# Patient Record
Sex: Male | Born: 1975 | Race: Black or African American | Hispanic: No | Marital: Married | Smoking: Current every day smoker
Health system: Southern US, Community
[De-identification: ages and names within clinical notes are randomized; demographics above are authoritative.]

## PROBLEM LIST (undated history)

## (undated) ENCOUNTER — Ambulatory Visit: Payer: Self-pay | Source: Home / Self Care

## (undated) DIAGNOSIS — R079 Chest pain, unspecified: Secondary | ICD-10-CM

## (undated) DIAGNOSIS — I10 Essential (primary) hypertension: Secondary | ICD-10-CM

## (undated) HISTORY — PX: ADENOIDECTOMY: SUR15

---

## 1998-03-07 ENCOUNTER — Emergency Department (HOSPITAL_COMMUNITY): Admission: EM | Admit: 1998-03-07 | Discharge: 1998-03-07 | Payer: Self-pay | Admitting: *Deleted

## 2000-09-09 ENCOUNTER — Emergency Department (HOSPITAL_COMMUNITY): Admission: EM | Admit: 2000-09-09 | Discharge: 2000-09-09 | Payer: Self-pay

## 2001-04-23 ENCOUNTER — Encounter: Payer: Self-pay | Admitting: Emergency Medicine

## 2001-04-23 ENCOUNTER — Emergency Department (HOSPITAL_COMMUNITY): Admission: EM | Admit: 2001-04-23 | Discharge: 2001-04-23 | Payer: Self-pay | Admitting: Emergency Medicine

## 2002-03-21 ENCOUNTER — Emergency Department (HOSPITAL_COMMUNITY): Admission: EM | Admit: 2002-03-21 | Discharge: 2002-03-21 | Payer: Self-pay | Admitting: Emergency Medicine

## 2002-05-21 ENCOUNTER — Emergency Department (HOSPITAL_COMMUNITY): Admission: EM | Admit: 2002-05-21 | Discharge: 2002-05-21 | Payer: Self-pay | Admitting: Emergency Medicine

## 2003-05-10 ENCOUNTER — Emergency Department (HOSPITAL_COMMUNITY): Admission: EM | Admit: 2003-05-10 | Discharge: 2003-05-10 | Payer: Self-pay | Admitting: Emergency Medicine

## 2005-01-19 ENCOUNTER — Emergency Department (HOSPITAL_COMMUNITY): Admission: EM | Admit: 2005-01-19 | Discharge: 2005-01-20 | Payer: Self-pay | Admitting: Emergency Medicine

## 2005-05-21 ENCOUNTER — Emergency Department (HOSPITAL_COMMUNITY): Admission: EM | Admit: 2005-05-21 | Discharge: 2005-05-21 | Payer: Self-pay | Admitting: Emergency Medicine

## 2006-12-05 ENCOUNTER — Emergency Department (HOSPITAL_COMMUNITY): Admission: EM | Admit: 2006-12-05 | Discharge: 2006-12-06 | Payer: Self-pay | Admitting: Emergency Medicine

## 2007-05-05 ENCOUNTER — Emergency Department (HOSPITAL_COMMUNITY): Admission: EM | Admit: 2007-05-05 | Discharge: 2007-05-05 | Payer: Self-pay | Admitting: Emergency Medicine

## 2007-09-25 ENCOUNTER — Emergency Department (HOSPITAL_COMMUNITY): Admission: EM | Admit: 2007-09-25 | Discharge: 2007-09-25 | Payer: Self-pay | Admitting: Emergency Medicine

## 2007-11-20 ENCOUNTER — Emergency Department (HOSPITAL_COMMUNITY): Admission: EM | Admit: 2007-11-20 | Discharge: 2007-11-20 | Payer: Self-pay | Admitting: Emergency Medicine

## 2015-07-25 ENCOUNTER — Encounter (HOSPITAL_COMMUNITY): Payer: Self-pay

## 2015-07-25 ENCOUNTER — Emergency Department (HOSPITAL_COMMUNITY)
Admission: EM | Admit: 2015-07-25 | Discharge: 2015-07-25 | Disposition: A | Discharge status: Home / Self Care | Payer: Self-pay | Attending: Emergency Medicine | Admitting: Emergency Medicine

## 2015-07-25 DIAGNOSIS — Z720 Tobacco use: Secondary | ICD-10-CM | POA: Insufficient documentation

## 2015-07-25 DIAGNOSIS — J069 Acute upper respiratory infection, unspecified: Secondary | ICD-10-CM | POA: Insufficient documentation

## 2015-07-25 DIAGNOSIS — R52 Pain, unspecified: Secondary | ICD-10-CM | POA: Insufficient documentation

## 2015-07-25 DIAGNOSIS — B349 Viral infection, unspecified: Secondary | ICD-10-CM | POA: Insufficient documentation

## 2015-07-25 LAB — COMPREHENSIVE METABOLIC PANEL
ALBUMIN: 4 g/dL (ref 3.5–5.0)
ALK PHOS: 88 U/L (ref 38–126)
ALT: 23 U/L (ref 17–63)
ANION GAP: 8 (ref 5–15)
AST: 29 U/L (ref 15–41)
BUN: 12 mg/dL (ref 6–20)
CALCIUM: 9.1 mg/dL (ref 8.9–10.3)
CO2: 27 mmol/L (ref 22–32)
Chloride: 104 mmol/L (ref 101–111)
Creatinine, Ser: 1.11 mg/dL (ref 0.61–1.24)
GFR calc Af Amer: >60 mL/min (ref >60)
GFR calc non Af Amer: >60 mL/min (ref >60)
GLUCOSE: 89 mg/dL (ref 65–99)
Potassium: 3.1 mmol/L — ABNORMAL LOW (ref 3.5–5.1)
SODIUM: 139 mmol/L (ref 135–145)
Total Bilirubin: 0.6 mg/dL (ref 0.3–1.2)
Total Protein: 7.9 g/dL (ref 6.5–8.1)

## 2015-07-25 LAB — CBC
HEMATOCRIT: 40.6 % (ref 39.0–52.0)
HEMOGLOBIN: 13.4 g/dL (ref 13.0–17.0)
MCH: 29 pg (ref 26.0–34.0)
MCHC: 33 g/dL (ref 30.0–36.0)
MCV: 87.9 fL (ref 78.0–100.0)
Platelets: 258 10*3/uL (ref 150–400)
RBC: 4.62 MIL/uL (ref 4.22–5.81)
RDW: 14.3 % (ref 11.5–15.5)
WBC: 7.6 10*3/uL (ref 4.0–10.5)

## 2015-07-25 LAB — LIPASE, BLOOD: Lipase: 20 U/L — ABNORMAL LOW (ref 22–51)

## 2015-07-25 MED ORDER — ALBUTEROL SULFATE HFA 108 (90 BASE) MCG/ACT IN AERS
2.0000 | INHALATION_SPRAY | Freq: Once | RESPIRATORY_TRACT | Status: AC
  Administered 2015-07-25: 2 via RESPIRATORY_TRACT
  Filled 2015-07-25: qty 6.7

## 2015-07-25 NOTE — ED Provider Notes (Signed)
CSN: [id number]     Arrival date & time 07/25/15  1530 History   First MD Initiated Contact with Patient 07/25/15 1614     Chief Complaint  Patient presents with  . Generalized Body Aches  . Emesis  . Diarrhea     (Consider location/radiation/quality/duration/timing/severity/associated sxs/prior Treatment) HPI Jeremy Hood is a 39 y.o. male who comes in for evaluation of generalized body aches, nausea and vomiting for the past 3 days. Patient states he was outside in the rain, took a bus home and was "very cold and had a seizure lasting", and at that time he began to have a runny nose, cough and nasal congestion. He reports associated generalized body aches and a subjective fever yesterday of 101.8. He also reports 1 episode of posttussive emesis and loose stools for 2 days, nonbloody. He denies taking any medications and his fever spontaneously resolved. Patient denies any overt discomfort now, reports that he needs a work note. No headache, chest pain, shortness of breath, leg swelling. No other aggravating or modifying factors.  History reviewed. No pertinent past medical history. Past Surgical History  Procedure Laterality Date  . Adnoidectomy     History reviewed. No pertinent family history. Social History  Substance Use Topics  . Smoking status: Current Every Day Smoker -- 0.00 packs/day    Types: Cigarettes  . Smokeless tobacco: None  . Alcohol Use: No    Review of Systems A 10 point review of systems was completed and was negative except for pertinent positives and negatives as mentioned in the history of present illness     Allergies  Review of patient's allergies indicates no known allergies.  Home Medications   Prior to Admission medications   Not on File   BP 181/99 mmHg  Pulse 77  Temp(Src) 99.1 F (37.3 C) (Oral)  Resp 16  SpO2 98% Physical Exam  Constitutional: He is oriented to person, place, and time. He appears well-developed and  well-nourished.  HENT:  Head: Normocephalic and atraumatic.  Mouth/Throat: Oropharynx is clear and moist.  Eyes: Conjunctivae are normal. Pupils are equal, round, and reactive to light. Right eye exhibits no discharge. Left eye exhibits no discharge. No scleral icterus.  Neck: Neck supple.  Cardiovascular: Normal rate, regular rhythm and normal heart sounds.   Pulmonary/Chest: Effort normal and breath sounds normal. No respiratory distress. He has no rales.  Para mild wheezing in right lower lobe. Lungs otherwise clear with no other adventitious lung sounds. Oxygenation is 99% on room air.  Abdominal: Soft. There is no tenderness.  Musculoskeletal: He exhibits no tenderness.  Neurological: He is alert and oriented to person, place, and time.  Cranial Nerves II-XII grossly intact  Skin: Skin is warm and dry. No rash noted.  Psychiatric: He has a normal mood and affect.  Nursing note and vitals reviewed.   ED Course  Procedures (including critical care time) Labs Review Labs Reviewed  LIPASE, BLOOD - Abnormal; Notable for the following:    Lipase 20 (*)    All other components within normal limits  COMPREHENSIVE METABOLIC PANEL - Abnormal; Notable for the following:    Potassium 3.1 (*)    All other components within normal limits  CBC  URINALYSIS, ROUTINE W REFLEX MICROSCOPIC (NOT AT [facility])    Imaging Review No results found. I have personally reviewed and evaluated these images and lab results as part of my medical decision-making.   EKG Interpretation None     Meds given in ED:  Medications  albuterol (PROVENTIL HFA;VENTOLIN HFA) 108 (90 BASE) MCG/ACT inhaler 2 puff (2 puffs Inhalation Given 07/25/15 1735)    There are no discharge medications for this patient.  Filed Vitals:   07/25/15 1558 07/25/15 1732 07/25/15 1735 07/25/15 1740  BP:   186/106 181/99  Pulse:  77    Temp:  99.1 F (37.3 C)    TempSrc:  Oral    Resp: 20 16    SpO2:  98%      MDM  Vitals  stable - afebrile. Patient will need follow-up with his primary care provider for further evaluation of his elevated blood pressure. No evidence of hypertensive emergency at this time. Pt resting comfortably in ED. PE--physical examination as above and grossly unremarkable. Very mild wheezing in right lower lobe Labs appear baseline for patient and are grossly unremarkable.  DDX--patient with URI symptoms secondary to likely viral syndrome. Patient declines any prescription medications at this time. Reports he only needs a work note. Will DC with MDI or mild wheezing and symptom support. No evidence of other acute or emergent pathology at this time. Overall, patient appears well, nontoxic and is appropriate for discharge. I discussed all relevant lab findings and imaging results with pt and they verbalized understanding. Discussed f/u with PCP within 48 hrs and return precautions, pt very amenable to plan.  Final diagnoses:  URI (upper respiratory infection)  Viral syndrome  Body aches      Comer Locket, PA-C 07/25/15 1900  Merrily Pew, MD 07/26/15 1715

## 2015-07-25 NOTE — Discharge Instructions (Signed)
Viral Infections °A viral infection can be caused by different types of viruses. Most viral infections are not serious and resolve on their own. However, some infections may cause severe symptoms and may lead to further complications. °SYMPTOMS °Viruses can frequently cause: °· Minor sore throat. °· Aches and pains. °· Headaches. °· Runny nose. °· Different types of rashes. °· Watery eyes. °· Tiredness. °· Cough. °· Loss of appetite. °· Gastrointestinal infections, resulting in nausea, vomiting, and diarrhea. °These symptoms do not respond to antibiotics because the infection is not caused by bacteria. However, you might catch a bacterial infection following the viral infection. This is sometimes called a "superinfection." Symptoms of such a bacterial infection may include: °· Worsening sore throat with pus and difficulty swallowing. °· Swollen neck glands. °· Chills and a high or persistent fever. °· Severe headache. °· Tenderness over the sinuses. °· Persistent overall ill feeling (malaise), muscle aches, and tiredness (fatigue). °· Persistent cough. °· Yellow, green, or brown mucus production with coughing. °HOME CARE INSTRUCTIONS  °· Only take over-the-counter or prescription medicines for pain, discomfort, diarrhea, or fever as directed by your caregiver. °· Drink enough water and fluids to keep your urine clear or pale yellow. Sports drinks can provide valuable electrolytes, sugars, and hydration. °· Get plenty of rest and maintain proper nutrition. Soups and broths with crackers or rice are fine. °SEEK IMMEDIATE MEDICAL CARE IF:  °· You have severe headaches, shortness of breath, chest pain, neck pain, or an unusual rash. °· You have uncontrolled vomiting, diarrhea, or you are unable to keep down fluids. °· You or your child has an oral temperature above 102° F (38.9° C), not controlled by medicine. °· Your baby is older than 3 months with a rectal temperature of 102° F (38.9° C) or higher. °· Your baby is 3  months old or younger with a rectal temperature of 100.4° F (38° C) or higher. °MAKE SURE YOU:  °· Understand these instructions. °· Will watch your condition. °· Will get help right away if you are not doing well or get worse. °  °This information is not intended to replace advice given to you by your health care provider. Make sure you discuss any questions you have with your health care provider. °  °Document Released: 07/16/2005 Document Revised: 12/29/2011 Document Reviewed: 03/14/2015 °Elsevier Interactive Patient Education ©2016 [employer] Inc[employer] ° °

## 2015-07-25 NOTE — ED Notes (Signed)
Patient C/O body aches, sore throat and congestion since Sunday Oct 2.  C/O of N/V/D since Monday, last vomited this morning.  Patient reports fevers at night with a fever of 101.8 last night.  Patient denies taking anything for symptoms today.  Patient denies pain.  NAD at this time.

## 2015-07-25 NOTE — ED Notes (Signed)
Pt escorted to discharge window. Pt verbalized understanding discharge instructions. In no acute distress.  

## 2015-12-16 ENCOUNTER — Encounter (HOSPITAL_COMMUNITY): Payer: Self-pay

## 2015-12-16 ENCOUNTER — Emergency Department (HOSPITAL_COMMUNITY)
Admission: EM | Admit: 2015-12-16 | Discharge: 2015-12-16 | Disposition: A | Discharge status: Home / Self Care | Payer: Self-pay | Attending: Emergency Medicine | Admitting: Emergency Medicine

## 2015-12-16 DIAGNOSIS — R197 Diarrhea, unspecified: Secondary | ICD-10-CM | POA: Insufficient documentation

## 2015-12-16 DIAGNOSIS — R6889 Other general symptoms and signs: Secondary | ICD-10-CM

## 2015-12-16 DIAGNOSIS — R509 Fever, unspecified: Secondary | ICD-10-CM | POA: Insufficient documentation

## 2015-12-16 DIAGNOSIS — R001 Bradycardia, unspecified: Secondary | ICD-10-CM | POA: Insufficient documentation

## 2015-12-16 DIAGNOSIS — R05 Cough: Secondary | ICD-10-CM | POA: Insufficient documentation

## 2015-12-16 DIAGNOSIS — F17210 Nicotine dependence, cigarettes, uncomplicated: Secondary | ICD-10-CM | POA: Insufficient documentation

## 2015-12-16 DIAGNOSIS — R112 Nausea with vomiting, unspecified: Secondary | ICD-10-CM | POA: Insufficient documentation

## 2015-12-16 DIAGNOSIS — R0981 Nasal congestion: Secondary | ICD-10-CM | POA: Insufficient documentation

## 2015-12-16 DIAGNOSIS — J029 Acute pharyngitis, unspecified: Secondary | ICD-10-CM | POA: Insufficient documentation

## 2015-12-16 MED ORDER — ONDANSETRON HCL 4 MG PO TABS: 4.0000 mg | ORAL_TABLET | Freq: Four times a day (QID) | ORAL | Status: DC

## 2015-12-16 MED ORDER — PSEUDOEPHEDRINE HCL 30 MG PO TABS: 30.0000 mg | ORAL_TABLET | ORAL | Status: DC | PRN

## 2015-12-16 NOTE — ED Provider Notes (Signed)
CSN: [id number]     Arrival date & time 12/16/15  1222 History   First MD Initiated Contact with Patient 12/16/15 1449     Chief Complaint  Patient presents with  . Fever  . Diarrhea     (Consider location/radiation/quality/duration/timing/severity/associated sxs/prior Treatment) HPI  Flu like symptoms for 4 days. Diarrhea, vomiting, cough, congestion, sore throat fever with tmax of 102. No known sick contacts. Slightly improved now. Has tried emergenC without relief. Didn't try anything else.   History reviewed. No pertinent past medical history. Past Surgical History  Procedure Laterality Date  . Adenoidectomy     History reviewed. No pertinent family history. Social History  Substance Use Topics  . Smoking status: Current Every Day Smoker -- 0.00 packs/day    Types: Cigarettes  . Smokeless tobacco: None  . Alcohol Use: No    Review of Systems  Constitutional: Negative for fever and fatigue.  Eyes: Negative for pain.  Respiratory: Positive for cough. Negative for shortness of breath.   Gastrointestinal: Positive for nausea, vomiting and diarrhea. Negative for abdominal pain.  Endocrine: Negative for polydipsia and polyuria.  Genitourinary: Negative for dysuria.  Musculoskeletal: Negative for back pain and neck pain.  All other systems reviewed and are negative.     Allergies  Review of patient's allergies indicates no known allergies.  Home Medications   Prior to Admission medications   Medication Sig Start Date End Date Taking? Authorizing Provider  ondansetron (ZOFRAN) 4 MG tablet Take 1 tablet (4 mg total) by mouth every 6 (six) hours. 12/16/15   Merrily Pew, MD  pseudoephedrine (SUDAFED) 30 MG tablet Take 1 tablet (30 mg total) by mouth every 4 (four) hours as needed for congestion. 12/16/15   Corene Cornea Reygan Heagle, MD   BP 163/80 mmHg  Pulse 40  Temp(Src) 98.4 F (36.9 C) (Oral)  Resp 18  SpO2 100% Physical Exam  Constitutional: He is oriented to person, place,  and time. He appears well-developed and well-nourished.  HENT:  Head: Normocephalic and atraumatic.  Neck: Normal range of motion.  Cardiovascular: Normal rate and regular rhythm.   Heart rate on my evaluation was in the 60s.   Pulmonary/Chest: Effort normal. No respiratory distress.  Abdominal: Soft. He exhibits no distension. There is no tenderness.  Musculoskeletal: Normal range of motion. He exhibits no edema or tenderness.  Neurological: He is alert and oriented to person, place, and time.  Skin: Skin is warm and dry.  Nursing note and vitals reviewed.   ED Course  Procedures (including critical care time) Labs Review Labs Reviewed - No data to display  Imaging Review No results found. I have personally reviewed and evaluated these images and lab results as part of my medical decision-making.   EKG Interpretation None      MDM   Final diagnoses:  Flu-like symptoms   Flu like symptoms. Appears well and non-toxic. Will give rx for symptomatic treatment. Note work as well.      Merrily Pew, MD 12/16/15 1520

## 2015-12-16 NOTE — ED Notes (Signed)
Per pt, fever, cough, congestion, abdominal pain, diarrhea since Tuesday.  Pt states he has been drinking gatorade.

## 2015-12-25 ENCOUNTER — Emergency Department (HOSPITAL_COMMUNITY)
Admission: EM | Admit: 2015-12-25 | Discharge: 2015-12-25 | Disposition: A | Discharge status: Home / Self Care | Payer: Self-pay | Attending: Emergency Medicine | Admitting: Emergency Medicine

## 2015-12-25 ENCOUNTER — Encounter (HOSPITAL_COMMUNITY): Payer: Self-pay | Admitting: Emergency Medicine

## 2015-12-25 DIAGNOSIS — L0231 Cutaneous abscess of buttock: Secondary | ICD-10-CM | POA: Insufficient documentation

## 2015-12-25 DIAGNOSIS — R509 Fever, unspecified: Secondary | ICD-10-CM | POA: Insufficient documentation

## 2015-12-25 DIAGNOSIS — L0291 Cutaneous abscess, unspecified: Secondary | ICD-10-CM

## 2015-12-25 DIAGNOSIS — F17210 Nicotine dependence, cigarettes, uncomplicated: Secondary | ICD-10-CM | POA: Insufficient documentation

## 2015-12-25 MED ORDER — SULFAMETHOXAZOLE-TRIMETHOPRIM 800-160 MG PO TABS: 1.0000 | ORAL_TABLET | Freq: Two times a day (BID) | ORAL | Status: AC

## 2015-12-25 MED ORDER — OXYCODONE-ACETAMINOPHEN 5-325 MG PO TABS
1.0000 | ORAL_TABLET | Freq: Once | ORAL | Status: AC
  Administered 2015-12-25: 1 via ORAL
  Filled 2015-12-25: qty 1

## 2015-12-25 MED ORDER — LIDOCAINE HCL (PF) 1 % IJ SOLN: 30.0000 mL | Freq: Once | INTRAMUSCULAR | Status: DC

## 2015-12-25 MED ORDER — LIDOCAINE HCL 1 % IJ SOLN
INTRAMUSCULAR | Status: AC
  Administered 2015-12-25: 20 mL
  Filled 2015-12-25: qty 20

## 2015-12-25 NOTE — ED Notes (Addendum)
Pt reports abscess to upper buttocks at the top of "my crack". Pain is 10/10. No drainage.

## 2015-12-25 NOTE — ED Provider Notes (Signed)
CSN: [id number]     Arrival date & time 12/25/15  1219 History   By signing my name below, I, Forrestine Him, attest that this documentation has been prepared under the direction and in the presence of Khaden Gater PA-C.  Electronically Signed: Forrestine Him, ED Scribe. 12/25/2015. 4:31 PM.   Chief Complaint  Patient presents with  . Abscess   The history is provided by the patient. No language interpreter was used.    HPI Comments: Jeremy Hood is a 40 y.o. male with a PMHx of abscesses who presents to the Emergency Department here for a possible abscess to the buttocks x 1 week. Currently he rates pain 10/10. No drainage noted. Discomfort is exacerbated with pressure to the area. No alleviating factors at this time. Pt also reports a fever of 100.2 at home along with some mild nausea. No OTC medications or home remedies attempted prior to arrival. No recent vomiting or chills. Pt admits abscesses in the past have required I&D procedures for treatment and healing. No known allergies to medications.  PCP: No primary care provider on file.    History reviewed. No pertinent past medical history. Past Surgical History  Procedure Laterality Date  . Adenoidectomy     No family history on file. Social History  Substance Use Topics  . Smoking status: Current Every Day Smoker -- 0.00 packs/day    Types: Cigarettes  . Smokeless tobacco: None  . Alcohol Use: No    Review of Systems  Constitutional: Positive for fever.  Skin: Positive for wound.  All other systems reviewed and are negative.     Allergies  Review of patient's allergies indicates no known allergies.  Home Medications   Prior to Admission medications   Medication Sig Start Date End Date Taking? Authorizing Provider  ondansetron (ZOFRAN) 4 MG tablet Take 1 tablet (4 mg total) by mouth every 6 (six) hours. 12/16/15   Merrily Pew, MD  pseudoephedrine (SUDAFED) 30 MG tablet Take 1 tablet (30 mg total) by mouth every 4  (four) hours as needed for congestion. 12/16/15   Merrily Pew, MD  sulfamethoxazole-trimethoprim (BACTRIM DS,SEPTRA DS) 800-160 MG tablet Take 1 tablet by mouth 2 (two) times daily. 12/25/15 01/01/16  Maryella Abood, PA-C   Triage Vitals: BP 157/100 mmHg  Pulse 83  Temp(Src) 98.3 F (36.8 C) (Oral)  Resp 18  SpO2 100%   Physical Exam  Constitutional: He appears well-developed and well-nourished. No distress.  Nontoxic appearing. Afebrile.   HENT:  Head: Normocephalic and atraumatic.  Right Ear: External ear normal.  Left Ear: External ear normal.  Eyes: Conjunctivae are normal. Right eye exhibits no discharge. Left eye exhibits no discharge. No scleral icterus.  Neck: Normal range of motion.  Cardiovascular: Normal rate.   Pulmonary/Chest: Effort normal.  Musculoskeletal: Normal range of motion.  Moves all extremities spontaneously  Neurological: He is alert. Coordination normal.  Skin: Skin is warm and dry.  Large area of induration with central fluctuance at superior gluteal cleft. Fluctuance approximately 3 cm in diameter. No active drainage at this time. No overlying erythema. No warmth of the area.   Psychiatric: He has a normal mood and affect. His behavior is normal.  Nursing note and vitals reviewed.   ED Course  Procedures (including critical care time)  DIAGNOSTIC STUDIES: Oxygen Saturation is 100% on RA, Normal by my interpretation.    COORDINATION OF CARE: 3:12 PM- Will perform I&D procedure. Discussed treatment plan with pt at bedside and pt agreed  to plan.     INCISION AND DRAINAGE PROCEDURE NOTE: Patient identification was confirmed and verbal consent was obtained. This procedure was performed by Josephina Gip PA-C at 4:02 PM. Site: Gluteal cleft  Sterile procedures observed Needle size: 23 Anesthetic used (type and amt): 1% lidocaine without epinephrine, 6 mL Blade size: 11 Drainage: Copious  Complexity: Complex Packing used: Yes, 1/4 iodoform packing   Site anesthetized, incision made over site, wound drained and explored loculations, rinsed with copious amounts of normal saline, wound packed with sterile gauze, covered with dry, sterile dressing.  Pt tolerated procedure well without complications.  Instructions for care discussed verbally and pt provided with additional written instructions for homecare and f/u.   Labs Review Labs Reviewed - No data to display  Imaging Review No results found. I have personally reviewed and evaluated these images and lab results as part of my medical decision-making.   EKG Interpretation None      MDM   Final diagnoses:  Abscess   Patient presenting with skin abscess of the gluteal cleft amenable to incision and drainage.  Patient tolerated the procedure well. 1/4 iodoform packing inserted. Due to hx of frequent abscess and size, will send home on bactrim. Encouraged warm soaks at home and keeping the wound clean and dry. Instructed to go to PCP or urgent care in 2 days for wound recheck. Patient expresses understanding and is stable for discharge.   I personally performed the services described in this documentation, which was scribed in my presence. The recorded information has been reviewed and is accurate.   Lahoma Crocker Yolonda Purtle, PA-C 12/25/15 1631  Tanna Furry, MD 12/31/15 [id number]

## 2015-12-25 NOTE — Discharge Instructions (Signed)
Go to PCP, urgent care or return to ED in 2 days for a wound check and to have your packing removed.    Abscess An abscess is an infected area that contains a collection of pus and debris.It can occur in almost any part of the body. An abscess is also known as a furuncle or boil. CAUSES  An abscess occurs when tissue gets infected. This can occur from blockage of oil or sweat glands, infection of hair follicles, or a minor injury to the skin. As the body tries to fight the infection, pus collects in the area and creates pressure under the skin. This pressure causes pain. People with weakened immune systems have difficulty fighting infections and get certain abscesses more often.  SYMPTOMS Usually an abscess develops on the skin and becomes a painful mass that is red, warm, and tender. If the abscess forms under the skin, you may feel a moveable soft area under the skin. Some abscesses break open (rupture) on their own, but most will continue to get worse without care. The infection can spread deeper into the body and eventually into the bloodstream, causing you to feel ill.  DIAGNOSIS  Your caregiver will take your medical history and perform a physical exam. A sample of fluid may also be taken from the abscess to determine what is causing your infection. TREATMENT  Your caregiver may prescribe antibiotic medicines to fight the infection. However, taking antibiotics alone usually does not cure an abscess. Your caregiver may need to make a small cut (incision) in the abscess to drain the pus. In some cases, gauze is packed into the abscess to reduce pain and to continue draining the area. HOME CARE INSTRUCTIONS   Only take over-the-counter or prescription medicines for pain, discomfort, or fever as directed by your caregiver.  If you were prescribed antibiotics, take them as directed. Finish them even if you start to feel better.  If gauze is used, follow your caregiver's directions for changing  the gauze.  To avoid spreading the infection:  Keep your draining abscess covered with a bandage.  Wash your hands well.  Do not share personal care items, towels, or whirlpools with others.  Avoid skin contact with others.  Keep your skin and clothes clean around the abscess.  Keep all follow-up appointments as directed by your caregiver. SEEK MEDICAL CARE IF:   You have increased pain, swelling, redness, fluid drainage, or bleeding.  You have muscle aches, chills, or a general ill feeling.  You have a fever. MAKE SURE YOU:   Understand these instructions.  Will watch your condition.  Will get help right away if you are not doing well or get worse.   This information is not intended to replace advice given to you by your health care provider. Make sure you discuss any questions you have with your health care provider.   Document Released: 07/16/2005 Document Revised: 04/06/2012 Document Reviewed: 12/19/2011 Elsevier Interactive Patient Education 2016 Elsevier Inc.  Incision and Drainage Incision and drainage is a procedure in which a sac-like structure (cystic structure) is opened and drained. The area to be drained usually contains material such as pus, fluid, or blood.  LET YOUR CAREGIVER KNOW ABOUT:   Allergies to medicine.  Medicines taken, including vitamins, herbs, eyedrops, over-the-counter medicines, and creams.  Use of steroids (by mouth or creams).  Previous problems with anesthetics or numbing medicines.  History of bleeding problems or blood clots.  Previous surgery.  Other health problems, including diabetes  and kidney problems.  Possibility of pregnancy, if this applies. RISKS AND COMPLICATIONS  Pain.  Bleeding.  Scarring.  Infection. BEFORE THE PROCEDURE  You may need to have an ultrasound or other imaging tests to see how large or deep your cystic structure is. Blood tests may also be used to determine if you have an infection or how  severe the infection is. You may need to have a tetanus shot. PROCEDURE  The affected area is cleaned with a cleaning fluid. The cyst area will then be numbed with a medicine (local anesthetic). A small incision will be made in the cystic structure. A syringe or catheter may be used to drain the contents of the cystic structure, or the contents may be squeezed out. The area will then be flushed with a cleansing solution. After cleansing the area, it is often gently packed with a gauze or another wound dressing. Once it is packed, it will be covered with gauze and tape or some other type of wound dressing. AFTER THE PROCEDURE   Often, you will be allowed to go home right after the procedure.  You may be given antibiotic medicine to prevent or heal an infection.  If the area was packed with gauze or some other wound dressing, you will likely need to come back in 1 to 2 days to get it removed.  The area should heal in about 14 days.   This information is not intended to replace advice given to you by your health care provider. Make sure you discuss any questions you have with your health care provider.   Document Released: 04/01/2001 Document Revised: 04/06/2012 Document Reviewed: 12/01/2011 Elsevier Interactive Patient Education [employer].

## 2015-12-27 ENCOUNTER — Encounter (HOSPITAL_COMMUNITY): Payer: Self-pay

## 2015-12-27 ENCOUNTER — Emergency Department (HOSPITAL_COMMUNITY)
Admission: EM | Admit: 2015-12-27 | Discharge: 2015-12-27 | Disposition: A | Discharge status: Home / Self Care | Payer: Self-pay | Attending: Emergency Medicine | Admitting: Emergency Medicine

## 2015-12-27 DIAGNOSIS — L0291 Cutaneous abscess, unspecified: Secondary | ICD-10-CM

## 2015-12-27 DIAGNOSIS — F17210 Nicotine dependence, cigarettes, uncomplicated: Secondary | ICD-10-CM | POA: Insufficient documentation

## 2015-12-27 DIAGNOSIS — Z79899 Other long term (current) drug therapy: Secondary | ICD-10-CM | POA: Insufficient documentation

## 2015-12-27 DIAGNOSIS — L0231 Cutaneous abscess of buttock: Secondary | ICD-10-CM | POA: Insufficient documentation

## 2015-12-27 DIAGNOSIS — Z792 Long term (current) use of antibiotics: Secondary | ICD-10-CM | POA: Insufficient documentation

## 2015-12-27 MED ORDER — HYDROCODONE-ACETAMINOPHEN 5-325 MG PO TABS: 1.0000 | ORAL_TABLET | Freq: Four times a day (QID) | ORAL | Status: DC | PRN

## 2015-12-27 NOTE — ED Notes (Signed)
Verbalized understanding discharge instructions. In no acute distress.   

## 2015-12-27 NOTE — ED Notes (Signed)
Pt given a work note.  

## 2015-12-27 NOTE — ED Notes (Signed)
Pt presents for wound check and packing removal.  Pt had I & D of sacral abscess x 2 days ago.  Pt reports taking antibiotics as prescribed.

## 2015-12-27 NOTE — ED Provider Notes (Signed)
CSN: [id number]     Arrival date & time 12/27/15  0957 History   None    Chief Complaint  Patient presents with  . Wound Check  . Packing Removal      (Consider location/radiation/quality/duration/timing/severity/associated sxs/prior Treatment) HPI Patient presents to the emergency department for recheck of an abscess was drained 2 days ago.  The patient states that he has had continued pain.  He has been draining.  No other complaints.  He denies fever, nausea, vomiting, weakness, dizziness, headache, blurred vision, chest pain, shortness of breath or syncope History reviewed. No pertinent past medical history. Past Surgical History  Procedure Laterality Date  . Adenoidectomy     History reviewed. No pertinent family history. Social History  Substance Use Topics  . Smoking status: Current Every Day Smoker -- 0.00 packs/day    Types: Cigarettes  . Smokeless tobacco: None  . Alcohol Use: No    Review of Systems  All other systems negative except as documented in the HPI. All pertinent positives and negatives as reviewed in the HPI.   Allergies  Review of patient's allergies indicates no known allergies.  Home Medications   Prior to Admission medications   Medication Sig Start Date End Date Taking? Authorizing Provider  ondansetron (ZOFRAN) 4 MG tablet Take 1 tablet (4 mg total) by mouth every 6 (six) hours. 12/16/15   Merrily Pew, MD  pseudoephedrine (SUDAFED) 30 MG tablet Take 1 tablet (30 mg total) by mouth every 4 (four) hours as needed for congestion. 12/16/15   Merrily Pew, MD  sulfamethoxazole-trimethoprim (BACTRIM DS,SEPTRA DS) 800-160 MG tablet Take 1 tablet by mouth 2 (two) times daily. 12/25/15 01/01/16  Stevi Barrett, PA-C   BP 147/100 mmHg  Pulse 77  Temp(Src) 98.4 F (36.9 C) (Oral)  Resp 16  SpO2 97% Physical Exam  Musculoskeletal:       Back:    ED Course  Procedures (including critical care time) Labs Review Labs Reviewed - No data to  display  Imaging Review No results found. I have personally reviewed and evaluated these images and lab results as part of my medical decision-making.   MDM   Final diagnoses:  None    Patient is referred to general surgery due to the fact this is what looks to be a pilonidal cyst.  Patient agrees the plan and all questions were answered    Dalia Heading, PA-C 12/31/15 [place], MD 01/02/16 1247

## 2015-12-27 NOTE — Discharge Instructions (Signed)
Return here as needed. Follow up with the surgeon provided. Use warm compresses around the area and soak in warm baths with epsom salts.

## 2016-04-15 ENCOUNTER — Emergency Department (HOSPITAL_COMMUNITY)
Admission: EM | Admit: 2016-04-15 | Discharge: 2016-04-15 | Disposition: A | Discharge status: Home / Self Care | Payer: Self-pay | Attending: Emergency Medicine | Admitting: Emergency Medicine

## 2016-04-15 ENCOUNTER — Emergency Department (HOSPITAL_COMMUNITY): Payer: Self-pay

## 2016-04-15 ENCOUNTER — Encounter (HOSPITAL_COMMUNITY): Payer: Self-pay | Admitting: *Deleted

## 2016-04-15 DIAGNOSIS — Y999 Unspecified external cause status: Secondary | ICD-10-CM | POA: Insufficient documentation

## 2016-04-15 DIAGNOSIS — F17210 Nicotine dependence, cigarettes, uncomplicated: Secondary | ICD-10-CM | POA: Insufficient documentation

## 2016-04-15 DIAGNOSIS — M25422 Effusion, left elbow: Secondary | ICD-10-CM | POA: Insufficient documentation

## 2016-04-15 DIAGNOSIS — Y929 Unspecified place or not applicable: Secondary | ICD-10-CM | POA: Insufficient documentation

## 2016-04-15 DIAGNOSIS — Y939 Activity, unspecified: Secondary | ICD-10-CM | POA: Insufficient documentation

## 2016-04-15 DIAGNOSIS — X509XXA Other and unspecified overexertion or strenuous movements or postures, initial encounter: Secondary | ICD-10-CM | POA: Insufficient documentation

## 2016-04-15 DIAGNOSIS — M25522 Pain in left elbow: Secondary | ICD-10-CM

## 2016-04-15 MED ORDER — HYDROCODONE-ACETAMINOPHEN 5-325 MG PO TABS: 1.0000 | ORAL_TABLET | Freq: Four times a day (QID) | ORAL | Status: DC | PRN

## 2016-04-15 NOTE — Progress Notes (Signed)
Orthopedic Tech Progress Note Patient Details:  Jeremy Hood 06-Jan-1976 [id number]  Ortho Devices Type of Ortho Device: Arm sling, Post (long arm) splint Splint Material: Fiberglass Ortho Device/Splint Location: lue Ortho Device/Splint Interventions: Ordered, Application   Karolee Stamps 04/15/2016, 11:11 PM

## 2016-04-15 NOTE — Discharge Instructions (Signed)
Mr. Jeremy Hood,  Nice meeting you! Please follow-up with orthopedics. Return to the emergency department if you develop increased pain/swelling, inability to move your arm, numbness/tingling, new/worsening symptoms. Feel better soon!  S. Wendie Simmer, PA-C

## 2016-04-15 NOTE — ED Notes (Signed)
Ortho contacted for splint

## 2016-04-15 NOTE — ED Provider Notes (Signed)
CSN: [id number]     Arrival date & time 04/15/16  2043 History  By signing my name below, I, Soijett Blue, attest that this documentation has been prepared under the direction and in the presence of S. Wendie Simmer, PA-C Electronically Signed: [place], ED Scribe. 04/15/2016. 10:55 PM.  Chief Complaint  Patient presents with  . Arm Pain   The history is provided by the patient. No language interpreter was used.   Jeremy Hood is a 40 y.o. male who presents to the Emergency Department complaining of 7/10 left arm pain onset 4 hours ago PTA. Pt notes that he was lifting heavy boxes when he felt a pop to his left elbow. Pt reports that his left elbow pain radiates to his left forearm. Pt states that his left elbow pain is worsened with movement and alleviated with immobilization. Pt is having associated symptoms of mild left forearm swelling. He notes that he has not tried any medications for the relief of his symptoms. He denies color change, wound, rash, and any other symptoms.   History reviewed. No pertinent past medical history. Past Surgical History  Procedure Laterality Date  . Adenoidectomy     No family history on file. Social History  Substance Use Topics  . Smoking status: Current Every Day Smoker -- 0.00 packs/day    Types: Cigarettes  . Smokeless tobacco: None  . Alcohol Use: Yes    Review of Systems  A complete 10 system review of systems was obtained and all systems are negative except as noted in the HPI and PMH.   Allergies  Review of patient's allergies indicates no known allergies.  Home Medications   Prior to Admission medications   Medication Sig Start Date End Date Taking? Authorizing Provider  HYDROcodone-acetaminophen (NORCO/VICODIN) 5-325 MG tablet Take 1 tablet by mouth every 6 (six) hours as needed for moderate pain. Patient not taking: Reported on 04/15/2016 12/27/15   Dalia Heading, PA-C  ondansetron (ZOFRAN) 4 MG tablet Take 1 tablet (4 mg  total) by mouth every 6 (six) hours. Patient not taking: Reported on 04/15/2016 12/16/15   Merrily Pew, MD  pseudoephedrine (SUDAFED) 30 MG tablet Take 1 tablet (30 mg total) by mouth every 4 (four) hours as needed for congestion. Patient not taking: Reported on 04/15/2016 12/16/15   Merrily Pew, MD   BP 151/103 mmHg  Pulse 83  Temp(Src) 99.4 F (37.4 C) (Oral)  Resp 16  Ht 6' (1.829 m)  Wt 270 lb (122.471 kg)  BMI 36.61 kg/m2  SpO2 98% Physical Exam  Constitutional: He is oriented to person, place, and time. He appears well-developed and well-nourished. No distress.  HENT:  Head: Normocephalic and atraumatic.  Eyes: EOM are normal.  Neck: Neck supple.  Cardiovascular: Normal rate.   Pulmonary/Chest: Effort normal. No respiratory distress.  Abdominal: He exhibits no distension.  Musculoskeletal: Normal range of motion.       Left elbow: He exhibits swelling. He exhibits normal range of motion. Tenderness found.  FROM. NVI. Minimal edema at left elbow. Tenderness at diffuse left olecranon.   Neurological: He is alert and oriented to person, place, and time.  Skin: Skin is warm and dry.  Psychiatric: He has a normal mood and affect. His behavior is normal.  Nursing note and vitals reviewed.   ED Course  Procedures  DIAGNOSTIC STUDIES: Oxygen Saturation is 98% on RA, nl by my interpretation.    COORDINATION OF CARE: 10:55 PM Discussed treatment plan with pt at bedside  which includes left elbow xray and pt agreed to plan.   Imaging Review Dg Elbow Complete Left  04/15/2016  CLINICAL DATA:  Pain after falling while lifting boxes today. EXAM: LEFT ELBOW - COMPLETE 3+ VIEW COMPARISON:  None. FINDINGS: Minimal cortical step-off at the lateral margin of the radial head, possibly a nondisplaced fracture. There also is fragmentation of the olecranon spur which may also be an acute fracture. However, there is no evidence of a hemarthrosis or joint effusion. There is moderate degenerative  irregularity at the ulnar side of the joint. No acute soft tissue finding. IMPRESSION: Question a nondisplaced fracture of the lateral edge of the radial head. Fragmented olecranon spur may also be an acute fracture. Follow-up radiography in 5 days would be conclusive if clinically needed. Electronically Signed   By: Andreas Newport M.D.   On: 04/15/2016 22:05   I have personally reviewed and evaluated these images as part of my medical decision-making.  MDM   Final diagnoses:  Left elbow pain   Patient X-Ray: "question a nondisplaced fracture of the lateral edge of the radial head. Fragmented olecranon spur may also be an acute fracture. Follow-up radiography in 5 days would be conclusive if clinically needed."  Pt advised to follow up with orthopedics. Patient given splint and arm sling while in ED. Will discharge with vicodin and robaxin.  Patient will be discharged home & is agreeable with above plan. Returns precautions discussed. Pt appears safe for discharge.  I personally performed the services described in this documentation, which was scribed in my presence. The recorded information has been reviewed and is accurate.  [place] Lions, PA-C 04/21/16 Elnora Liu, MD 04/21/16 1640

## 2016-04-15 NOTE — ED Notes (Signed)
Pt states he was lifting boxes today and felt a pop in left elbow. C/o pain in the left elbow, pain goes down through his arm with swelling. No meds prior to arrival.

## 2016-07-08 ENCOUNTER — Encounter (HOSPITAL_COMMUNITY): Payer: Self-pay | Admitting: Emergency Medicine

## 2016-07-08 ENCOUNTER — Emergency Department (HOSPITAL_COMMUNITY)
Admission: EM | Admit: 2016-07-08 | Discharge: 2016-07-08 | Disposition: A | Discharge status: Home / Self Care | Payer: Self-pay | Attending: Emergency Medicine | Admitting: Emergency Medicine

## 2016-07-08 DIAGNOSIS — R197 Diarrhea, unspecified: Secondary | ICD-10-CM | POA: Insufficient documentation

## 2016-07-08 DIAGNOSIS — R112 Nausea with vomiting, unspecified: Secondary | ICD-10-CM

## 2016-07-08 DIAGNOSIS — F17210 Nicotine dependence, cigarettes, uncomplicated: Secondary | ICD-10-CM | POA: Insufficient documentation

## 2016-07-08 DIAGNOSIS — Z79899 Other long term (current) drug therapy: Secondary | ICD-10-CM | POA: Insufficient documentation

## 2016-07-08 DIAGNOSIS — F1290 Cannabis use, unspecified, uncomplicated: Secondary | ICD-10-CM | POA: Insufficient documentation

## 2016-07-08 MED ORDER — ONDANSETRON HCL 4 MG PO TABS: 4.0000 mg | ORAL_TABLET | Freq: Four times a day (QID) | ORAL | 0 refills | Status: DC

## 2016-07-08 NOTE — ED Provider Notes (Signed)
[facility] DEPT Provider Note   CSN: [id number] Arrival date & time: 07/08/16  1756   By signing my name below, I, Estanislado Pandy, attest that this documentation has been prepared under the direction and in the presence of Eliezer Mccoy, PA-C. Electronically Signed: Estanislado Pandy, Scribe. 07/08/2016. 6:40 PM.   History   Chief Complaint No chief complaint on file.   The history is provided by the patient. No language interpreter was used.   HPI Comments:  ROLF STANGLAND is a 40 y.o. male who presents to the Emergency Department withA one-day history of cold sweats, watery diarrhea, vomiting, generalized fatigue that have all occurred since this morning. Patient states he has had 2-3 episodes of vomiting and diarrhea since this morning. Patient denies any blood in his vomit or stool. Pt denies SOB, cough, chest pain, urinary symptoms. Patient reports only mild abdominal soreness from vomiting.  History reviewed. No pertinent past medical history.  There are no active problems to display for this patient.   Past Surgical History:  Procedure Laterality Date  . ADENOIDECTOMY         Home Medications    Prior to Admission medications   Medication Sig Start Date End Date Taking? Authorizing Provider  HYDROcodone-acetaminophen (NORCO/VICODIN) 5-325 MG tablet Take 1-2 tablets by mouth every 6 (six) hours as needed. 04/15/16   [place] Lions, PA-C  ondansetron (ZOFRAN) 4 MG tablet Take 1 tablet (4 mg total) by mouth every 6 (six) hours. 07/08/16   Frederica Kuster, PA-C  pseudoephedrine (SUDAFED) 30 MG tablet Take 1 tablet (30 mg total) by mouth every 4 (four) hours as needed for congestion. Patient not taking: Reported on 04/15/2016 12/16/15   Merrily Pew, MD    Family History History reviewed. No pertinent family history.  Social History Social History  Substance Use Topics  . Smoking status: Current Every Day Smoker    Packs/day: 0.50    Types: Cigarettes  .  Smokeless tobacco: Not on file  . Alcohol use Yes     Allergies   Review of patient's allergies indicates no known allergies.   Review of Systems Review of Systems  Constitutional: Positive for chills, diaphoresis and fatigue. Negative for fever.  HENT: Negative for facial swelling and sore throat.   Respiratory: Negative for cough and shortness of breath.   Cardiovascular: Negative for chest pain.  Gastrointestinal: Positive for abdominal pain (soreness), diarrhea, nausea and vomiting. Negative for blood in stool.  Genitourinary: Negative for dysuria, frequency and urgency.  Musculoskeletal: Negative for back pain.  Skin: Negative for rash and wound.  Neurological: Negative for headaches.  Psychiatric/Behavioral: The patient is not nervous/anxious.      Physical Exam Updated Vital Signs BP (!) 161/107 (BP Location: Right Arm)   Pulse 81   Temp 98.5 F (36.9 C) (Oral)   Resp 16   SpO2 91%   Physical Exam  Constitutional: He appears well-developed and well-nourished. No distress.  HENT:  Head: Normocephalic and atraumatic.  Mouth/Throat: Oropharynx is clear and moist. No oropharyngeal exudate.  Eyes: Conjunctivae are normal. Pupils are equal, round, and reactive to light. Right eye exhibits no discharge. Left eye exhibits no discharge. No scleral icterus.  Neck: Normal range of motion. Neck supple. No thyromegaly present.  Cardiovascular: Normal rate, regular rhythm, normal heart sounds and intact distal pulses.  Exam reveals no gallop and no friction rub.   No murmur heard. Pulmonary/Chest: Effort normal and breath sounds normal. No stridor. No respiratory distress. He has  no wheezes. He has no rales.  Abdominal: Soft. Bowel sounds are normal. He exhibits no distension. There is no tenderness. There is no rebound and no guarding.  Musculoskeletal: He exhibits no edema.  Lymphadenopathy:    He has no cervical adenopathy.  Neurological: He is alert. Coordination normal.    Skin: Skin is warm and dry. No rash noted. He is not diaphoretic. No pallor.  Psychiatric: He has a normal mood and affect.  Nursing note and vitals reviewed.    ED Treatments / Results  DIAGNOSTIC STUDIES:  Oxygen Saturation is 91% on RA, adequate by my interpretation.    COORDINATION OF CARE:  6:40 PM Discussed treatment plan with pt at bedside and pt agreed to plan.   Labs (all labs ordered are listed, but only abnormal results are displayed) Labs Reviewed - No data to display  EKG  EKG Interpretation None       Radiology No results found.  Procedures Procedures (including critical care time)  Medications Ordered in ED Medications - No data to display   Initial Impression / Assessment and Plan / ED Course  I have reviewed the triage vital signs and the nursing notes.  Pertinent labs & imaging results that were available during my care of the patient were reviewed by me and considered in my medical decision making (see chart for details).  Clinical Course    Patient with symptoms consistent with viral gastroenteritis.  Vitals are stable, no fever.  No signs of dehydration, tolerating PO fluids > 6 oz.  Lungs are clear.  Benign abdominal exam. No focal abdominal pain, no concern for appendicitis, cholecystitis, pancreatitis, ruptured viscus, UTI, kidney stone, or any other abdominal etiology.  I offered patient's basic labs, however he declined. 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 Supportive therapy indicated, Zofran, with return if symptoms worsen.  Patient counseled understands and agrees with plan. Patient vitals stable throughout ED course and discharged in satisfactory condition. Patient also encouraged to follow up and establish care with a primary care provider to have blood pressure rechecked and further evaluated.   Final Clinical Impressions(s) / ED Diagnoses   Final diagnoses:  Nausea vomiting and diarrhea     New Prescriptions Current Discharge Medication List    I personally performed the services described in this documentation, which was scribed in my presence. The recorded information has been reviewed and is accurate.        Frederica Kuster, PA-C 07/08/16 1901    Leo Grosser, MD 07/09/16 [phone]

## 2016-07-08 NOTE — ED Triage Notes (Signed)
Pt c/o low abdominal pain, emesis, diarrhea, chills, generalized body aches. No otalgia, sore throat, cough.

## 2016-07-08 NOTE — Progress Notes (Signed)
Pt states he ate chicken last night when he got off from work. He awoke this am with diarrhea and vomitting. Pt did take advil this am for back pain.

## 2016-07-08 NOTE — Progress Notes (Signed)
Patient listed as having no pcp or insurance.  EDCM went to speak to patient at bedside, however, patient was discharged.

## 2016-07-08 NOTE — Discharge Instructions (Signed)
Medications: Zofran  Treatment: Take Zofran every 6 hours as needed for nausea and vomiting. Make sure to drink plenty of fluids, at least 8 glasses of water daily.  Follow-up: Please return to emergency department if you develop any new or worsening symptoms, such as severe abdominal pain.

## 2016-11-20 DIAGNOSIS — R079 Chest pain, unspecified: Secondary | ICD-10-CM

## 2016-11-20 HISTORY — DX: Chest pain, unspecified: R07.9

## 2016-11-23 ENCOUNTER — Encounter (HOSPITAL_COMMUNITY): Payer: Self-pay

## 2016-11-23 ENCOUNTER — Emergency Department (HOSPITAL_COMMUNITY): Payer: Self-pay

## 2016-11-23 ENCOUNTER — Inpatient Hospital Stay (HOSPITAL_COMMUNITY)
Admission: EM | Admit: 2016-11-23 | Discharge: 2016-12-05 | DRG: 205 | Disposition: A | Discharge status: Home / Self Care | Payer: Self-pay | Attending: Internal Medicine | Admitting: Internal Medicine

## 2016-11-23 DIAGNOSIS — K029 Dental caries, unspecified: Secondary | ICD-10-CM | POA: Diagnosis present

## 2016-11-23 DIAGNOSIS — F17210 Nicotine dependence, cigarettes, uncomplicated: Secondary | ICD-10-CM | POA: Diagnosis present

## 2016-11-23 DIAGNOSIS — R008 Other abnormalities of heart beat: Secondary | ICD-10-CM | POA: Diagnosis present

## 2016-11-23 DIAGNOSIS — F1410 Cocaine abuse, uncomplicated: Secondary | ICD-10-CM

## 2016-11-23 DIAGNOSIS — J9601 Acute respiratory failure with hypoxia: Secondary | ICD-10-CM

## 2016-11-23 DIAGNOSIS — K0889 Other specified disorders of teeth and supporting structures: Secondary | ICD-10-CM

## 2016-11-23 DIAGNOSIS — R079 Chest pain, unspecified: Secondary | ICD-10-CM | POA: Diagnosis present

## 2016-11-23 DIAGNOSIS — Z6837 Body mass index (BMI) 37.0-37.9, adult: Secondary | ICD-10-CM

## 2016-11-23 DIAGNOSIS — F1910 Other psychoactive substance abuse, uncomplicated: Secondary | ICD-10-CM

## 2016-11-23 DIAGNOSIS — Z720 Tobacco use: Secondary | ICD-10-CM

## 2016-11-23 DIAGNOSIS — Z5320 Procedure and treatment not carried out because of patient's decision for unspecified reasons: Secondary | ICD-10-CM | POA: Diagnosis present

## 2016-11-23 DIAGNOSIS — Z8249 Family history of ischemic heart disease and other diseases of the circulatory system: Secondary | ICD-10-CM

## 2016-11-23 DIAGNOSIS — E669 Obesity, unspecified: Secondary | ICD-10-CM | POA: Diagnosis present

## 2016-11-23 DIAGNOSIS — R0602 Shortness of breath: Secondary | ICD-10-CM

## 2016-11-23 DIAGNOSIS — R918 Other nonspecific abnormal finding of lung field: Secondary | ICD-10-CM

## 2016-11-23 DIAGNOSIS — I10 Essential (primary) hypertension: Secondary | ICD-10-CM | POA: Diagnosis present

## 2016-11-23 DIAGNOSIS — I76 Septic arterial embolism: Secondary | ICD-10-CM

## 2016-11-23 DIAGNOSIS — Z23 Encounter for immunization: Secondary | ICD-10-CM

## 2016-11-23 DIAGNOSIS — E663 Overweight: Secondary | ICD-10-CM | POA: Diagnosis present

## 2016-11-23 DIAGNOSIS — R059 Cough, unspecified: Secondary | ICD-10-CM

## 2016-11-23 DIAGNOSIS — R05 Cough: Secondary | ICD-10-CM | POA: Diagnosis present

## 2016-11-23 DIAGNOSIS — J984 Other disorders of lung: Secondary | ICD-10-CM

## 2016-11-23 DIAGNOSIS — K047 Periapical abscess without sinus: Secondary | ICD-10-CM

## 2016-11-23 DIAGNOSIS — R0781 Pleurodynia: Secondary | ICD-10-CM | POA: Diagnosis present

## 2016-11-23 DIAGNOSIS — F1210 Cannabis abuse, uncomplicated: Secondary | ICD-10-CM | POA: Diagnosis present

## 2016-11-23 DIAGNOSIS — Z803 Family history of malignant neoplasm of breast: Secondary | ICD-10-CM

## 2016-11-23 DIAGNOSIS — E876 Hypokalemia: Secondary | ICD-10-CM | POA: Diagnosis present

## 2016-11-23 DIAGNOSIS — K769 Liver disease, unspecified: Secondary | ICD-10-CM

## 2016-11-23 DIAGNOSIS — K045 Chronic apical periodontitis: Secondary | ICD-10-CM | POA: Diagnosis present

## 2016-11-23 DIAGNOSIS — M940 Chondrocostal junction syndrome [Tietze]: Principal | ICD-10-CM | POA: Diagnosis present

## 2016-11-23 DIAGNOSIS — K7689 Other specified diseases of liver: Secondary | ICD-10-CM | POA: Diagnosis present

## 2016-11-23 DIAGNOSIS — C799 Secondary malignant neoplasm of unspecified site: Secondary | ICD-10-CM

## 2016-11-23 DIAGNOSIS — Z833 Family history of diabetes mellitus: Secondary | ICD-10-CM

## 2016-11-23 HISTORY — DX: Essential (primary) hypertension: I10

## 2016-11-23 HISTORY — DX: Chest pain, unspecified: R07.9

## 2016-11-23 LAB — CBC
HEMATOCRIT: 35.8 % — AB (ref 39.0–52.0)
HEMOGLOBIN: 11.8 g/dL — AB (ref 13.0–17.0)
MCH: 28.7 pg (ref 26.0–34.0)
MCHC: 33 g/dL (ref 30.0–36.0)
MCV: 87.1 fL (ref 78.0–100.0)
PLATELETS: 280 10*3/uL (ref 150–400)
RBC: 4.11 MIL/uL — AB (ref 4.22–5.81)
RDW: 14.4 % (ref 11.5–15.5)
WBC: 8.4 10*3/uL (ref 4.0–10.5)

## 2016-11-23 LAB — BASIC METABOLIC PANEL
Anion gap: 12 (ref 5–15)
BUN: 16 mg/dL (ref 6–20)
CHLORIDE: 102 mmol/L (ref 101–111)
CO2: 23 mmol/L (ref 22–32)
CREATININE: 1.26 mg/dL — AB (ref 0.61–1.24)
Calcium: 8.6 mg/dL — ABNORMAL LOW (ref 8.9–10.3)
GFR calc non Af Amer: >60 mL/min (ref >60)
Glucose, Bld: 146 mg/dL — ABNORMAL HIGH (ref 65–99)
POTASSIUM: 3.1 mmol/L — AB (ref 3.5–5.1)
Sodium: 137 mmol/L (ref 135–145)

## 2016-11-23 LAB — MAGNESIUM: MAGNESIUM: 2 mg/dL (ref 1.7–2.4)

## 2016-11-23 LAB — I-STAT TROPONIN, ED
TROPONIN I, POC: 0.03 ng/mL (ref 0.00–0.08)
Troponin i, poc: 0.04 ng/mL (ref 0.00–0.08)

## 2016-11-23 LAB — TROPONIN I: Troponin I: 0.05 ng/mL (ref <0.03)

## 2016-11-23 MED ORDER — POTASSIUM CHLORIDE CRYS ER 20 MEQ PO TBCR
20.0000 meq | EXTENDED_RELEASE_TABLET | Freq: Once | ORAL | Status: AC
  Administered 2016-11-23: 20 meq via ORAL
  Filled 2016-11-23: qty 1

## 2016-11-23 MED ORDER — HYDRALAZINE HCL 20 MG/ML IJ SOLN: 10.0000 mg | Freq: Three times a day (TID) | INTRAMUSCULAR | Status: DC | PRN

## 2016-11-23 MED ORDER — ONDANSETRON HCL 4 MG/2ML IJ SOLN
4.0000 mg | Freq: Four times a day (QID) | INTRAMUSCULAR | Status: DC | PRN
  Administered 2016-11-29: 4 mg via INTRAVENOUS
  Filled 2016-11-23: qty 2

## 2016-11-23 MED ORDER — HEPARIN SODIUM (PORCINE) 5000 UNIT/ML IJ SOLN
5000.0000 [IU] | Freq: Three times a day (TID) | INTRAMUSCULAR | Status: DC
  Administered 2016-11-23 – 2016-12-03 (×19): 5000 [IU] via SUBCUTANEOUS
  Filled 2016-11-23 (×20): qty 1

## 2016-11-23 MED ORDER — AMLODIPINE BESYLATE 5 MG PO TABS
5.0000 mg | ORAL_TABLET | Freq: Once | ORAL | Status: AC
  Administered 2016-11-23: 5 mg via ORAL
  Filled 2016-11-23: qty 1

## 2016-11-23 MED ORDER — ACETAMINOPHEN 325 MG PO TABS
650.0000 mg | ORAL_TABLET | ORAL | Status: DC | PRN
  Administered 2016-11-24 – 2016-12-01 (×11): 650 mg via ORAL
  Filled 2016-11-23 (×11): qty 2

## 2016-11-23 MED ORDER — ASPIRIN 81 MG PO CHEW
324.0000 mg | CHEWABLE_TABLET | Freq: Once | ORAL | Status: AC
  Administered 2016-11-23: 324 mg via ORAL
  Filled 2016-11-23: qty 4

## 2016-11-23 MED ORDER — SODIUM CHLORIDE 0.9 % IV BOLUS (SEPSIS)
1000.0000 mL | Freq: Once | INTRAVENOUS | Status: AC
  Administered 2016-11-23: 1000 mL via INTRAVENOUS

## 2016-11-23 MED ORDER — KETOROLAC TROMETHAMINE 30 MG/ML IJ SOLN
15.0000 mg | Freq: Once | INTRAMUSCULAR | Status: AC
  Administered 2016-11-23: 15 mg via INTRAVENOUS
  Filled 2016-11-23: qty 1

## 2016-11-23 MED ORDER — MORPHINE SULFATE (PF) 4 MG/ML IV SOLN
4.0000 mg | Freq: Once | INTRAVENOUS | Status: DC
  Filled 2016-11-23: qty 1

## 2016-11-23 MED ORDER — CYCLOBENZAPRINE HCL 10 MG PO TABS
5.0000 mg | ORAL_TABLET | Freq: Three times a day (TID) | ORAL | Status: DC | PRN
  Administered 2016-11-25 – 2016-11-26 (×3): 5 mg via ORAL
  Filled 2016-11-23 (×3): qty 1

## 2016-11-23 MED ORDER — PNEUMOCOCCAL VAC POLYVALENT 25 MCG/0.5ML IJ INJ
0.5000 mL | INJECTION | INTRAMUSCULAR | Status: DC
  Filled 2016-11-23 (×2): qty 0.5

## 2016-11-23 MED ORDER — MORPHINE SULFATE (PF) 4 MG/ML IV SOLN
2.0000 mg | INTRAVENOUS | Status: DC | PRN
  Administered 2016-11-29: 2 mg via INTRAVENOUS
  Filled 2016-11-23: qty 1

## 2016-11-23 MED ORDER — CYCLOBENZAPRINE HCL 10 MG PO TABS
10.0000 mg | ORAL_TABLET | Freq: Once | ORAL | Status: AC
  Administered 2016-11-23: 10 mg via ORAL
  Filled 2016-11-23: qty 1

## 2016-11-23 MED ORDER — CHLORTHALIDONE 50 MG PO TABS
50.0000 mg | ORAL_TABLET | Freq: Every day | ORAL | Status: DC
  Administered 2016-11-23 – 2016-12-05 (×13): 50 mg via ORAL
  Filled 2016-11-23 (×13): qty 1

## 2016-11-23 MED ORDER — NITROGLYCERIN 0.4 MG SL SUBL
0.4000 mg | SUBLINGUAL_TABLET | SUBLINGUAL | Status: DC | PRN
  Administered 2016-11-23: 0.4 mg via SUBLINGUAL
  Filled 2016-11-23: qty 1

## 2016-11-23 MED ORDER — GI COCKTAIL ~~LOC~~: 30.0000 mL | Freq: Four times a day (QID) | ORAL | Status: DC | PRN

## 2016-11-23 MED ORDER — INFLUENZA VAC SPLIT QUAD 0.5 ML IM SUSY
0.5000 mL | PREFILLED_SYRINGE | INTRAMUSCULAR | Status: AC
  Administered 2016-11-27: 0.5 mL via INTRAMUSCULAR
  Filled 2016-11-23: qty 0.5

## 2016-11-23 MED ORDER — ASPIRIN EC 325 MG PO TBEC
325.0000 mg | DELAYED_RELEASE_TABLET | Freq: Every day | ORAL | Status: DC
  Administered 2016-11-24 – 2016-12-05 (×12): 325 mg via ORAL
  Filled 2016-11-23 (×12): qty 1

## 2016-11-23 MED ORDER — ZOLPIDEM TARTRATE 5 MG PO TABS
5.0000 mg | ORAL_TABLET | Freq: Every evening | ORAL | Status: DC | PRN
  Administered 2016-11-25 – 2016-12-01 (×2): 5 mg via ORAL
  Filled 2016-11-23 (×2): qty 1

## 2016-11-23 NOTE — ED Notes (Signed)
Pt ambulates to BR with steady gait.

## 2016-11-23 NOTE — ED Notes (Signed)
Dr. Hobbs at bedside  

## 2016-11-23 NOTE — H&P (Addendum)
[facility] History and Physical  Jeremy Hood [id number] DOB: 1976/06/04 DOA: 11/23/2016  Referring physician:  PCP: No PCP Per Patient   Chief Complaint: "My chest just hurt."  HPI: Jeremy Hood is a 41 y.o. male  pmh of tobacco abuse and uncontrolled hypertension presents emergency room with chief complaint of chest pain. Patient states the family this is ever happened before. Denies any chest trauma. Denies any recent coughing. Patient states that the pain came out of nowhere it is a pressure. Rates it as severe. Did not radiate. No nausea vomiting diaphoresis associated with. Patient does have some sweating at baseline. Patient has no family history of heart attack and family members less than age 3. Patient states that his chest pain does get worse with movement. No hx of VTE.  ED course: Patient given aspirin full dose. Also given 15mg  of Toradol. Chest x-ray negative. Hospitalists called for admission. Per MD req Cardio looked at EKG and cleared pt for standard obs admit.   Review of Systems:  As per HPI otherwise 10 point review of systems negative.    Past Medical History:  Diagnosis Date  . Hypertension    Past Surgical History:  Procedure Laterality Date  . ADENOIDECTOMY     Social History:  reports that he has been smoking Cigarettes.  He has been smoking about 0.50 packs per day. He does not have any smokeless tobacco history on file. He reports that he drinks alcohol. He reports that he uses drugs, including Marijuana.  No Known Allergies  No family history on file.   Prior to Admission medications   Medication Sig Start Date End Date Taking? Authorizing Provider  HYDROcodone-acetaminophen (NORCO/VICODIN) 5-325 MG tablet Take 1-2 tablets by mouth every 6 (six) hours as needed. Patient not taking: Reported on 11/23/2016 04/15/16   [place] Lions, PA-C  ondansetron (ZOFRAN) 4 MG tablet Take 1 tablet (4 mg total) by mouth every 6 (six)  hours. Patient not taking: Reported on 11/23/2016 07/08/16   Frederica Kuster, PA-C  pseudoephedrine (SUDAFED) 30 MG tablet Take 1 tablet (30 mg total) by mouth every 4 (four) hours as needed for congestion. Patient not taking: Reported on 04/15/2016 12/16/15   Merrily Pew, MD   Physical Exam: Vitals:   11/23/16 1515 11/23/16 1530 11/23/16 1545 11/23/16 1600  BP: 123/78 144/92 152/95 151/97  Pulse: 73 74 75 81  Resp: (!) 30 19 21 26   Temp:      TempSrc:      SpO2: 98% 100% 98% 99%    Wt Readings from Last 3 Encounters:  04/15/16 122.5 kg (270 lb)    General:  Appears calm and comfortable, Alert and oriented 3 Eyes:  PERRL, EOMI, normal lids, iris ENT:  grossly normal hearing, lips & tongue Neck:  no LAD, masses or thyromegaly Cardiovascular:  RRR, no m/r/g. No LE edema. No JVD. Respiratory:  CTA bilaterally, no w/r/r. Normal respiratory effort. Chest wall tender to palpation anteriorly Abdomen:  soft, ntnd Skin:  no rash or induration seen on limited exam Musculoskeletal:  grossly normal tone BUE/BLE Psychiatric:  grossly normal mood and affect, speech fluent and appropriate Neurologic:  CN 2-12 grossly intact, moves all extremities in coordinated fashion.          Labs on Admission:  Basic Metabolic Panel:  Recent Labs Lab 11/23/16 1037  NA 137  K 3.1*  CL 102  CO2 23  GLUCOSE 146*  BUN 16  CREATININE 1.26*  CALCIUM 8.6*  MG 2.0   Liver Function Tests: No results for input(s): AST, ALT, ALKPHOS, BILITOT, PROT, ALBUMIN in the last 168 hours. No results for input(s): LIPASE, AMYLASE in the last 168 hours. No results for input(s): AMMONIA in the last 168 hours. CBC:  Recent Labs Lab 11/23/16 1037  WBC 8.4  HGB 11.8*  HCT 35.8*  MCV 87.1  PLT 280   Cardiac Enzymes: No results for input(s): CKTOTAL, CKMB, CKMBINDEX, TROPONINI in the last 168 hours.  BNP (last 3 results) No results for input(s): BNP in the last 8760 hours.  ProBNP (last 3 results) No  results for input(s): PROBNP in the last 8760 hours.   Creatinine clearance cannot be calculated (Unknown ideal weight.)  CBG: No results for input(s): GLUCAP in the last 168 hours.  Radiological Exams on Admission: Dg Chest 2 View  Result Date: 11/23/2016 CLINICAL DATA:  41 year old male with its sharp central chest pain onset this morning. Pleuritic pain. Initial encounter. EXAM: CHEST  2 VIEW COMPARISON:  Chest radiographs 09/25/2007. FINDINGS: Lower lung volumes with crowding of lung markings and extend to a shin of cardiac size. Other mediastinal contours are within normal limits. Visualized tracheal air column is within normal limits. No pneumothorax or pleural effusion. No pulmonary edema suspected. No confluent pulmonary opacity. Negative visible bowel gas pattern. No acute osseous abnormality identified. IMPRESSION: Low lung volumes.  No acute cardiopulmonary abnormality. Electronically Signed   By: Genevie Ann M.D.   On: 11/23/2016 11:28    EKG: Independently reviewed. Ventricular rate 83, paratubal 138, QRS 118, QTC 488, normal sinus rhythm, electrical criteria for LVH, ST depressions in V5 V6, incomplete right bundle branch block  Assessment/Plan Principal Problem:   Chest pain Active Problems:   Costochondritis   HTN (hypertension)  1) CP Given hear score of 3 in ED. - serial trop ordered, initial neg - prn EKG CP - prn moprhine CP - prn ntg cp - asa in ED and QD - echo ordered for AM - Cardio consult in, placed by EDP - tele bed, cardiac monitoring - ambien for sleep prn - zofran prn for nausea Cardiology reviewed EKG to rule out possible aVR STEMI equivalent and they feel it is LVH see documentation by EDP  Mild elevation in creatinine We'll monitor Not likely AKI  Low potassium Replacing the emergency room Recheck in the morning  Costochondritis When necessary Flexeril  Hypertension uncontrolled Note patient's BP was normal upon arrival and  increased Patient states at baseline his blood pressure at home is normally [id number] systolic When necessary hydralazine Chlorthalidone daily Amlodipine 5 mg started in the emergency room patient could be sent on dual blood pressure therapy on discharge depending if he tolerates it, continuing chlorthalidone for now  Tobacco abuse Advised smk cessation  Code Status: FULL DVT Prophylaxis: Heparin Family Communication: wife at bedside Disposition Plan: Pending Improvement  Status: obs tele  Elwin Mocha, MD Family Medicine [facility] [email or web address] Password [email or web address]

## 2016-11-23 NOTE — ED Provider Notes (Signed)
Jeremy Hood Provider Note   CSN: [id number] Arrival date & time: 11/23/16  1027     History   Chief Complaint Chief Complaint  Patient presents with  . Chest Pain     HPI   Blood pressure 160/98, pulse 78, temperature 99.1 F (37.3 C), temperature source Oral, resp. rate 22, SpO2 98 %.  Jeremy Hood is a 41 y.o. male with past medical history significant for hypertension (he doesn't take medication for this, he is uninsured with no primary care) he is reporting retrosternal and low back pain onset yesterday with no associated symptoms states it's exacerbated by movement and position. He took some Tylenol PM and ibuprofen last night and that eased his pain to the point where he was able to sleep. He denies cough, fever, shortness of breath, nausea, vomiting, change in bowel or bladder habits, history of DVT/PE, cocaine or methamphetamine use, increasing peripheral edema. He is a daily smoker and overweight with no family history of ACS.   Past Medical History:  Diagnosis Date  . Hypertension     Patient Active Problem List   Diagnosis Date Noted  . Chest pain 11/23/2016    Past Surgical History:  Procedure Laterality Date  . ADENOIDECTOMY         Home Medications    Prior to Admission medications   Medication Sig Start Date End Date Taking? Authorizing Provider  HYDROcodone-acetaminophen (NORCO/VICODIN) 5-325 MG tablet Take 1-2 tablets by mouth every 6 (six) hours as needed. Patient not taking: Reported on 11/23/2016 04/15/16   Council Lions, PA-C  ondansetron (ZOFRAN) 4 MG tablet Take 1 tablet (4 mg total) by mouth every 6 (six) hours. Patient not taking: Reported on 11/23/2016 07/08/16   Frederica Kuster, PA-C  pseudoephedrine (SUDAFED) 30 MG tablet Take 1 tablet (30 mg total) by mouth every 4 (four) hours as needed for congestion. Patient not taking: Reported on 04/15/2016 12/16/15   Merrily Pew, MD    Family History No family history on  file.  Social History Social History  Substance Use Topics  . Smoking status: Current Every Day Smoker    Packs/day: 0.50    Types: Cigarettes  . Smokeless tobacco: Not on file  . Alcohol use Yes     Allergies   Patient has no known allergies.   Review of Systems Review of Systems  10 systems reviewed and found to be negative, except as noted in the HPI.   Physical Exam Updated Vital Signs BP 144/92   Pulse 74   Temp 99.1 F (37.3 C) (Oral)   Resp 19   SpO2 100%   Physical Exam  Constitutional: He is oriented to person, place, and time. He appears well-developed and well-nourished. No distress.  HENT:  Head: Normocephalic.  Mouth/Throat: Oropharynx is clear and moist.  Eyes: Conjunctivae are normal.  Neck: Normal range of motion. No JVD present. No tracheal deviation present.  Cardiovascular: Normal rate, regular rhythm and intact distal pulses.   Radial pulse equal bilaterally  Pulmonary/Chest: Effort normal and breath sounds normal. No stridor. No respiratory distress. He has no wheezes. He has no rales. He exhibits tenderness.  Chest is tender on the sternum  Abdominal: Soft. He exhibits no distension and no mass. There is no tenderness. There is no rebound and no guarding.  Musculoskeletal: Normal range of motion. He exhibits no edema or tenderness.  No calf asymmetry, superficial collaterals, palpable cords, edema, Homans sign negative bilaterally.    Neurological: He is  alert and oriented to person, place, and time.  Skin: Skin is warm. He is not diaphoretic.  Psychiatric: He has a normal mood and affect.  Nursing note and vitals reviewed.    ED Treatments / Results  Labs (all labs ordered are listed, but only abnormal results are displayed) Labs Reviewed  BASIC METABOLIC PANEL - Abnormal; Notable for the following:       Result Value   Potassium 3.1 (*)    Glucose, Bld 146 (*)    Creatinine, Ser 1.26 (*)    Calcium 8.6 (*)    All other components  within normal limits  CBC - Abnormal; Notable for the following:    RBC 4.11 (*)    Hemoglobin 11.8 (*)    HCT 35.8 (*)    All other components within normal limits  MAGNESIUM  I-STAT TROPOININ, ED  I-STAT TROPOININ, ED    EKG  EKG Interpretation  Date/Time:  Sunday November 23 2016 10:43:26 EST Ventricular Rate:  83 PR Interval:  138 QRS Duration: 118 QT Interval:  416 QTC Calculation: 488 R Axis:   -20 Text Interpretation:  Normal sinus rhythm Possible Left atrial enlargement Right bundle branch block Left ventricular hypertrophy Abnormal ECG No old tracing to compare Confirmed by [facility] MD, JULIE [phone]) on 11/23/2016 1:30:12 PM       Radiology Dg Chest 2 View  Result Date: 11/23/2016 CLINICAL DATA:  41 year old male with its sharp central chest pain onset this morning. Pleuritic pain. Initial encounter. EXAM: CHEST  2 VIEW COMPARISON:  Chest radiographs 09/25/2007. FINDINGS: Lower lung volumes with crowding of lung markings and extend to a shin of cardiac size. Other mediastinal contours are within normal limits. Visualized tracheal air column is within normal limits. No pneumothorax or pleural effusion. No pulmonary edema suspected. No confluent pulmonary opacity. Negative visible bowel gas pattern. No acute osseous abnormality identified. IMPRESSION: Low lung volumes.  No acute cardiopulmonary abnormality. Electronically Signed   By: Genevie Ann M.D.   On: 11/23/2016 11:28    Procedures Procedures (including critical care time)  Medications Ordered in ED Medications  nitroGLYCERIN (NITROSTAT) SL tablet 0.4 mg (0.4 mg Sublingual Given 11/23/16 1442)  aspirin chewable tablet 324 mg (324 mg Oral Given 11/23/16 1153)  ketorolac (TORADOL) 30 MG/ML injection 15 mg (15 mg Intravenous Given 11/23/16 1215)  sodium chloride 0.9 % bolus 1,000 mL (0 mLs Intravenous Stopped 11/23/16 1253)  amLODipine (NORVASC) tablet 5 mg (5 mg Oral Given 11/23/16 1254)  potassium chloride SA (K-DUR,KLOR-CON) CR  tablet 20 mEq (20 mEq Oral Given 11/23/16 1440)     Initial Impression / Assessment and Plan / ED Course  I have reviewed the triage vital signs and the nursing notes.  Pertinent labs & imaging results that were available during my care of the patient were reviewed by me and considered in my medical decision making (see chart for details).    Vitals:   11/23/16 1449 11/23/16 1500 11/23/16 1515 11/23/16 1530  BP: 150/84 127/71 123/78 144/92  Pulse: 79 72 73 74  Resp: 25 (!) 32 (!) 30 19  Temp:      TempSrc:      SpO2: 97% 98% 98% 100%    Medications  nitroGLYCERIN (NITROSTAT) SL tablet 0.4 mg (0.4 mg Sublingual Given 11/23/16 1442)  aspirin chewable tablet 324 mg (324 mg Oral Given 11/23/16 1153)  ketorolac (TORADOL) 30 MG/ML injection 15 mg (15 mg Intravenous Given 11/23/16 1215)  sodium chloride 0.9 % bolus 1,000 mL (  0 mLs Intravenous Stopped 11/23/16 1253)  amLODipine (NORVASC) tablet 5 mg (5 mg Oral Given 11/23/16 1254)  potassium chloride SA (K-DUR,KLOR-CON) CR tablet 20 mEq (20 mEq Oral Given 11/23/16 1440)    MAYKOL SPORN is 41 y.o. male presenting with Chest pain onset yesterday, he also has associated low back pain, chest pain is reproducible to palpation, states it's exacerbated by exertion, multiple cardiac risk factors including smoking, untreated hypertension. EKG with LVH, left bundle branch block, nonspecific changes, no prior to compare it to. He feels improved with Toradol but chest pain still persists, given his abnormal EKG, lack of outpatient follow-up will need admission for chest pain rule out. Unassigned admission to [facility] hospitalist Dr. Aggie Moats who is concerned that this patient is having the aVR elevation STEMI equivalent. Case discussed with STEMI doctor Dr. varicosity who is evaluated the EKG and does not think this is a STEMI, think is likely just LVH.  Holding orders placed for observation admission.     Final Clinical Impressions(s) / ED Diagnoses   Final  diagnoses:  Chest pain, unspecified type    New Prescriptions New Prescriptions   No medications on file     Monico Blitz, PA-C 11/23/16 [place], MD 11/24/16 [phone]

## 2016-11-24 ENCOUNTER — Observation Stay (HOSPITAL_BASED_OUTPATIENT_CLINIC_OR_DEPARTMENT_OTHER): Payer: Self-pay

## 2016-11-24 ENCOUNTER — Observation Stay (HOSPITAL_COMMUNITY): Payer: Self-pay

## 2016-11-24 ENCOUNTER — Encounter (HOSPITAL_COMMUNITY): Payer: Self-pay | Admitting: Cardiology

## 2016-11-24 DIAGNOSIS — R079 Chest pain, unspecified: Secondary | ICD-10-CM

## 2016-11-24 LAB — ECHOCARDIOGRAM COMPLETE
HEIGHTINCHES: 71 in
Weight: 4286.4 oz

## 2016-11-24 LAB — TROPONIN I
Troponin I: 0.04 ng/mL (ref <0.03)
Troponin I: 0.04 ng/mL (ref <0.03)

## 2016-11-24 LAB — RAPID URINE DRUG SCREEN, HOSP PERFORMED
AMPHETAMINES: NOT DETECTED
BARBITURATES: NOT DETECTED
BENZODIAZEPINES: NOT DETECTED
Cocaine: POSITIVE — AB
Opiates: NOT DETECTED
Tetrahydrocannabinol: POSITIVE — AB

## 2016-11-24 LAB — NM MYOCAR MULTI W/SPECT W/WALL MOTION / EF
CHL CUP RESTING HR STRESS: 72 {beats}/min
Peak HR: 100 {beats}/min

## 2016-11-24 LAB — D-DIMER, QUANTITATIVE (NOT AT ARMC): D DIMER QUANT: 1.13 ug{FEU}/mL — AB (ref 0.00–0.50)

## 2016-11-24 LAB — BASIC METABOLIC PANEL
ANION GAP: 8 (ref 5–15)
BUN: 10 mg/dL (ref 6–20)
CALCIUM: 8.9 mg/dL (ref 8.9–10.3)
CO2: 27 mmol/L (ref 22–32)
Chloride: 102 mmol/L (ref 101–111)
Creatinine, Ser: 1.17 mg/dL (ref 0.61–1.24)
GFR calc Af Amer: >60 mL/min (ref >60)
GFR calc non Af Amer: >60 mL/min (ref >60)
GLUCOSE: 123 mg/dL — AB (ref 65–99)
Potassium: 3.3 mmol/L — ABNORMAL LOW (ref 3.5–5.1)
Sodium: 137 mmol/L (ref 135–145)

## 2016-11-24 MED ORDER — REGADENOSON 0.4 MG/5ML IV SOLN
0.4000 mg | Freq: Once | INTRAVENOUS | Status: DC
  Filled 2016-11-24: qty 5

## 2016-11-24 MED ORDER — TECHNETIUM TC 99M TETROFOSMIN IV KIT
30.0000 | PACK | Freq: Once | INTRAVENOUS | Status: AC | PRN
  Administered 2016-11-24: 30 via INTRAVENOUS

## 2016-11-24 MED ORDER — POTASSIUM CHLORIDE CRYS ER 20 MEQ PO TBCR
40.0000 meq | EXTENDED_RELEASE_TABLET | Freq: Once | ORAL | Status: AC
  Administered 2016-11-24: 40 meq via ORAL
  Filled 2016-11-24: qty 2

## 2016-11-24 MED ORDER — IOPAMIDOL (ISOVUE-370) INJECTION 76%
INTRAVENOUS | Status: AC
  Administered 2016-11-24: 85 mL
  Filled 2016-11-24: qty 100

## 2016-11-24 MED ORDER — REGADENOSON 0.4 MG/5ML IV SOLN
INTRAVENOUS | Status: AC
  Filled 2016-11-24: qty 5

## 2016-11-24 MED ORDER — TECHNETIUM TC 99M TETROFOSMIN IV KIT
10.0000 | PACK | Freq: Once | INTRAVENOUS | Status: AC | PRN
  Administered 2016-11-24: 10 via INTRAVENOUS

## 2016-11-24 NOTE — Progress Notes (Signed)
PA in room; PE scan negative for PE; proceeding with stress test

## 2016-11-24 NOTE — Hospital Discharge Follow-Up (Signed)
Patient in need of a hospital follow up appointment at [facility] as per [name], RN CM. An appointment was scheduled for 11/28/16 @ 0930 and the information was placed on the AVS.  Update provided to  B. Adelfa Koh, RN CM

## 2016-11-24 NOTE — Consult Note (Addendum)
Cardiology Consult    Patient ID: EMYR PAPALEO MRN: [id number], DOB/AGE: 04-19-1976   Admit date: 11/23/2016 Date of Consult: 11/24/2016  Primary Physician: No PCP Per Patient Reason for Consult: Chest pain Primary Cardiologist: New- Dr. Acie Fredrickson Requesting Provider: Dr. Eliseo Squires   History of Present Illness    Jeremy Hood is a 41 y.o. with past medical history significant for hypertension, untreated as pt has no PCP or insurance, who present to the [facility] ED for evaluation of chest pain.  He developed chest pain, sharp with pressure on Saturday night while lying down after work. He also had associated shortness of breath which scared him and he came to the ED. He has never had this pain before. The pain is now gone while at rest, but he feels that pain and shortness of breath with movement and taking a deep breath. The pain is reproducible with palpation of the mid sternal area. He is also complaining of bilateral leg aching "like there is fever in them". Pt had elevated blood pressure on admission and intermittently since. He has also had been febrile with max temp of 100.6.   He has a history of hypertension, untreated, smoking 1/2 PPD since teens and marijuana,  no known HLD, no known cardiac disease, no diabetes, no previous blood clots or PE. His family history is significant for mother with HTN and DM, the patient is unaware of any cardiac family history.  Troponins 0.04-->0.03-->0.05 (1st two were POC) K+ 3.1, Cr 1.26-->1.17 CXR:  Low lung volumes.  No acute cardiopulmonary abnormality.  EKG showed sinus rhythm at 82 bpm with LVH and j point elevations thought to be related to LVH per cardiology, and non-specific ST/T changes. No old EKG for comparison.   Past Medical History   Past Medical History:  Diagnosis Date  . Hypertension     Past Surgical History:  Procedure Laterality Date  . ADENOIDECTOMY       Allergies  No Known Allergies  Inpatient  Medications    . aspirin EC  325 mg Oral Daily  . chlorthalidone  50 mg Oral Daily  . heparin  5,000 Units Subcutaneous Q8H  . Influenza vac split quadrivalent PF  0.5 mL Intramuscular Tomorrow-1000  . pneumococcal 23 valent vaccine  0.5 mL Intramuscular Tomorrow-1000    Family History    Family History  Problem Relation Age of Onset  . Hypertension Mother   . Diabetes Mother     Social History    Social History   Social History  . Marital status: Married    Spouse name: N/A  . Number of children: N/A  . Years of education: N/A   Occupational History  . Not on file.   Social History Main Topics  . Smoking status: Current Every Day Smoker    Packs/day: 0.50    Types: Cigarettes  . Smokeless tobacco: Not on file  . Alcohol use Yes  . Drug use: Yes    Types: Marijuana  . Sexual activity: Not on file   Other Topics Concern  . Not on file   Social History Narrative  . No narrative on file     Review of Systems   General:  No chills, fever, night sweats or weight changes.  Cardiovascular:  Positive for chest pain and shortness of breath, No edema, orthopnea, palpitations, paroxysmal nocturnal dyspnea. Dermatological: No rash, lesions/masses Respiratory: No cough, dyspnea Urologic: No hematuria, dysuria Abdominal:   No nausea, vomiting, diarrhea, bright  red blood per rectum, melena, or hematemesis Neurologic:  No visual changes, wkns, changes in mental status. All other systems reviewed and are otherwise negative except as noted above.  Physical Exam   Blood pressure (!) 159/98, pulse 78, temperature 98.7 F (37.1 C), temperature source Oral, resp. rate 18, height 5\' 11"  (1.803 m), weight 267 lb 14.4 oz (121.5 kg), SpO2 97 %.  General: Pleasant, NAD Psych: Normal affect. Neuro: Alert and oriented X 3. Moves all extremities spontaneously. HEENT: Normal  Neck: Supple without bruits or JVD. Lungs:  Resp regular and unlabored, CTA. Heart: RRR no s3, s4, or  murmurs. Abdomen: Soft, non-tender, non-distended, BS + x 4.  Extremities: No clubbing, cyanosis or edema. DP/PT/Radials 2+ and equal bilaterally.  Labs    Troponin [facility] Test)  Recent Labs  11/23/16 1544  TROPIPOC 0.03    Recent Labs  11/23/16 1745  TROPONINI 0.05*   Lab Results  Component Value Date   WBC 8.4 11/23/2016   HGB 11.8 (L) 11/23/2016   HCT 35.8 (L) 11/23/2016   MCV 87.1 11/23/2016   PLT 280 11/23/2016     Recent Labs Lab 11/24/16 0354  NA 137  K 3.3*  CL 102  CO2 27  BUN 10  CREATININE 1.17  CALCIUM 8.9  GLUCOSE 123*   No results found for: CHOL, HDL, LDLCALC, TRIG No results found for: [facility]   Radiology Studies    Dg Chest 2 View  Result Date: 11/23/2016 CLINICAL DATA:  41 year old male with its sharp central chest pain onset this morning. Pleuritic pain. Initial encounter. EXAM: CHEST  2 VIEW COMPARISON:  Chest radiographs 09/25/2007. FINDINGS: Lower lung volumes with crowding of lung markings and extend to a shin of cardiac size. Other mediastinal contours are within normal limits. Visualized tracheal air column is within normal limits. No pneumothorax or pleural effusion. No pulmonary edema suspected. No confluent pulmonary opacity. Negative visible bowel gas pattern. No acute osseous abnormality identified. IMPRESSION: Low lung volumes.  No acute cardiopulmonary abnormality. Electronically Signed   By: Genevie Ann M.D.   On: 11/23/2016 11:28    EKG & Cardiac Imaging   EKG:  EKG showed sinus rhythm at 82 bpm with LVH and j point elevations thought to be related to LVH per cardiology, and non-specific ST/T changes. No old EKG for comparison.  Echocardiogram: none on file Echo has been ordered  Assessment & Plan    Chest pain -Developed sharp chest pain and pressure on Saturday night after work while lying down. He denies any trauma. Pain is worse with movement and reproducible with palpation. -Troponins 0.04-->0.03-->0.05 (1st two  were POC), Mildly elevated with flat trend likely to be related to uncontrolled hypertension -K+ 3.1, Cr 1.26-->1.17 -CXR:  Low lung volumes.  No acute cardiopulmonary abnormality. -EKG showed sinus rhythm at 82 bpm with LVH and j point elevations thought to be related to LVH per cardiology, and non-specific ST/T changes. No old EKG for comparison. -Differentials include musculoskeletal, pericarditis, PE, and cardiac origin (less likely) -Pt is NPO for possible testing. Will discuss with cardiologist  Hypertension -Untreated. Pt has no PCP or insurance.  -blood pressure noted to be elevated at previous ED visits. -Initiate antihypertensive (Chlorthalidone per IM) and check echo for LV function, wall motion and evaluate LVH seen on EKG -Will need blood monitoring periodically while on thiazide diuretic as pt was hypokalemic on admission  Hypokalemia -Followed by IM, replacing K+  Tobacco use -Advise cessation   Signed, Sherlyn Hay  Phylliss Bob, NP-C 11/24/2016, 7:51 AM Pager: [phone]  Attending Note:   The patient was seen and examined.  Agree with assessment and plan as noted above.  Changes made to the above note as needed.  Patient seen and independently examined with Pecolia Ades, NP .   We discussed all aspects of the encounter. I agree with the assessment and plan as stated above.  1. Chest pain :   Has some atypical features - pleuretic at times.  Off and on for the past 2 days.   Lasts for several minutes. Worse with deep breath and lying back .  Seems to be slightly better sitting forward.  No chest wall strain that he can recall  Has NS ST changes .   His troponins are minimally elevated. I think our best course of action is to proceed with a [place] study. We will continue to draw troponin levels through the day.  His symptoms are also consistent with possible pericarditis. We can consider empiric cultures seen therapy. We also should start him on some  nonsteroidal anti-inflammatory agent.  We'll get a d-dimer for further evaluation of a pulmonary embolus.  2. Essential hypertension: He has not been taking medicines but has a history of hypertension. His wife is concerned about this. I've advised him to make sure that he takes some time to get to the doctor and get his blood pressure under control. I advised him to limit his salt intake. He is a Training and development officer at [employer] and   may be eating more salt than he should.    I have spent a total of 40 minutes with patient reviewing hospital  notes , telemetry, EKGs, labs and examining patient as well as establishing an assessment and plan that was discussed with the patient. > 50% of time was spent in direct patient care.    Thayer Headings, Brooke Bonito., MD, [facility] 11/24/2016, 8:36 AM [address]  [address] Office [phone] Pager [phone]   Addendum:   Pt had an elevated d-dimer. CT angio of lungs showed no pulmonary embolus but showed several lesions thought to be c/w septic emboli. Will get an echo today and plan TEE tomorrow .   OK to proceed with nuc study today     Mertie Moores, MD  11/24/2016 11:53 AM    [facility] [address],  [address] [place], [place]  [address] Pager [phone] Phone: [phone]; Fax: [phone]

## 2016-11-24 NOTE — Progress Notes (Signed)
PROGRESS NOTE    Jeremy Hood  [id number] DOB: 06-12-1976 DOA: 11/23/2016 PCP: No PCP Per Patient   Outpatient Specialists:     Brief Narrative:  Jeremy Hood is a 41 y.o. male  pmh of tobacco abuse and uncontrolled hypertension presents emergency room with chief complaint of chest pain. Patient states the family this is ever happened before. Denies any chest trauma. Denies any recent coughing. Patient states that the pain came out of nowhere it is a pressure. Rates it as severe. Did not radiate. No nausea vomiting diaphoresis associated with. Patient does have some sweating at baseline. Patient has no family history of heart attack and family members less than age 30. Patient states that his chest pain does get worse with movement. No hx of VTE.  ED course: Patient given aspirin full dose. Also given 15mg  of Toradol. Chest x-ray negative. Hospitalists called for admission. Per MD req Cardio looked at EKG and cleared pt for standard obs admit.    Assessment & Plan:   Principal Problem:   Chest pain Active Problems:   Costochondritis   HTN (hypertension)   CP-  Stress test Echo pending -d dimer was elevated but CTA negative for clot-- did show nodules-- septic emboli or metastasis for TEE in AM- if negative will need CT Scans of chest/abd/pelvis  Low potassium replete  Costochondritis When necessary Flexeril  Cocaine abuse -encouraged ceessation  Tobacco abuse Advised smk cessation   DVT prophylaxis:  SQ Heparin  Code Status: Full Code   Family Communication: wife  Disposition Plan:     Consultants:   cards   Subjective: Still with pain with inspiration  Objective: Vitals:   11/24/16 1159 11/24/16 1206 11/24/16 1207 11/24/16 1209  BP:  (!) 168/64 134/72 (!) 155/96  Pulse: 74 100 100 98  Resp: 16     Temp: 98.8 F (37.1 C)     TempSrc: Oral     SpO2: 98%     Weight:      Height:        Intake/Output Summary (Last 24  hours) at 11/24/16 1415 Last data filed at 11/24/16 0945  Gross per 24 hour  Intake                0 ml  Output             1500 ml  Net            -1500 ml   Filed Weights   11/24/16 0403  Weight: 121.5 kg (267 lb 14.4 oz)    Examination:  General exam: Appears calm and comfortable  Respiratory system: Clear to auscultation. Respiratory effort normal. Cardiovascular system: S1 & S2 heard, RRR. No JVD, murmurs, rubs, gallops or clicks. No pedal edema. Gastrointestinal system: Abdomen is nondistended, soft and nontender. No organomegaly or masses felt. Normal bowel sounds heard. Central nervous system: Alert and oriented. No focal neurological deficits.      Data Reviewed: I have personally reviewed following labs and imaging studies  CBC:  Recent Labs Lab 11/23/16 1037  WBC 8.4  HGB 11.8*  HCT 35.8*  MCV 87.1  PLT 123456   Basic Metabolic Panel:  Recent Labs Lab 11/23/16 1037 11/24/16 0354  NA 137 137  K 3.1* 3.3*  CL 102 102  CO2 23 27  GLUCOSE 146* 123*  BUN 16 10  CREATININE 1.26* 1.17  CALCIUM 8.6* 8.9  MG 2.0  --    GFR: Estimated Creatinine Clearance: 112.5  mL/min (by C-G formula based on SCr of 1.17 mg/dL). Liver Function Tests: No results for input(s): AST, ALT, ALKPHOS, BILITOT, PROT, ALBUMIN in the last 168 hours. No results for input(s): LIPASE, AMYLASE in the last 168 hours. No results for input(s): AMMONIA in the last 168 hours. Coagulation Profile: No results for input(s): INR, PROTIME in the last 168 hours. Cardiac Enzymes:  Recent Labs Lab 11/23/16 1745 11/24/16 0836  TROPONINI 0.05* 0.04*   BNP (last 3 results) No results for input(s): PROBNP in the last 8760 hours. HbA1C: No results for input(s): HGBA1C in the last 72 hours. CBG: No results for input(s): GLUCAP in the last 168 hours. Lipid Profile: No results for input(s): CHOL, HDL, LDLCALC, TRIG, CHOLHDL, LDLDIRECT in the last 72 hours. Thyroid Function Tests: No results  for input(s): TSH, T4TOTAL, FREET4, T3FREE, THYROIDAB in the last 72 hours. Anemia Panel: No results for input(s): VITAMINB12, FOLATE, FERRITIN, TIBC, IRON, RETICCTPCT in the last 72 hours. Urine analysis: No results found for: COLORURINE, APPEARANCEUR, LABSPEC, [place], GLUCOSEU, HGBUR, BILIRUBINUR, KETONESUR, PROTEINUR, UROBILINOGEN, NITRITE, LEUKOCYTESUR   )No results found for this or any previous visit (from the past 240 hour(s)).    Anti-infectives    None       Radiology Studies: Dg Chest 2 View  Result Date: 11/23/2016 CLINICAL DATA:  42 year old male with its sharp central chest pain onset this morning. Pleuritic pain. Initial encounter. EXAM: CHEST  2 VIEW COMPARISON:  Chest radiographs 09/25/2007. FINDINGS: Lower lung volumes with crowding of lung markings and extend to a shin of cardiac size. Other mediastinal contours are within normal limits. Visualized tracheal air column is within normal limits. No pneumothorax or pleural effusion. No pulmonary edema suspected. No confluent pulmonary opacity. Negative visible bowel gas pattern. No acute osseous abnormality identified. IMPRESSION: Low lung volumes.  No acute cardiopulmonary abnormality. Electronically Signed   By: Genevie Ann M.D.   On: 11/23/2016 11:28   Ct Angio Chest Pe W Or Wo Contrast  Result Date: 11/24/2016 CLINICAL DATA:  Pt c/o left sided CP since last night, SOB Hx of HTN EXAM: CT ANGIOGRAPHY CHEST WITH CONTRAST TECHNIQUE: Multidetector CT imaging of the chest was performed using the standard protocol during bolus administration of intravenous contrast. Multiplanar CT image reconstructions and MIPs were obtained to evaluate the vascular anatomy. CONTRAST:  85cc isovue 370 COMPARISON:  None. FINDINGS: Cardiovascular: Left arm IV contrast administration. Innominate vein and SVC patent. Right atrium and right ventricle nondilated. Satisfactory opacification of pulmonary arteries noted, and there is no evidence of pulmonary  emboli. Patent bilateral pulmonary veins drain into the left atrium. Adequate contrast opacification of the thoracic aorta with no evidence of dissection, aneurysm, or stenosis. There is bovine variant brachiocephalic arch anatomy without proximal stenosis. Mediastinum/Nodes: No enlarged mediastinal, hilar, or axillary lymph nodes. Thyroid gland, trachea, and esophagus demonstrate no significant findings. Lungs/Pleura: Bilateral pulmonary nodules involving all lobes. The margins of most of the nodules are somewhat hazy and indistinct. None demonstrate calcification or definite cavitation. Representative lesions include 14 mm in the left apex, image 21/607 ; 12 mm pleural-based nodule, anterior right upper lobe image 43/607. There are patchy airspace opacities posteriorly in both lung bases probably a combination of pulmonary nodules and adjacent atelectasis/consolidation. Trace bilateral pleural effusions. No pneumothorax. Upper Abdomen: No acute abnormality. Musculoskeletal: Anterior vertebral endplate spurring at multiple levels in the mid and lower thoracic spine. Review of the MIP images confirms the above findings. IMPRESSION: 1. Negative for acute PE or thoracic aortic  dissection. 2. Bilateral pulmonary nodules. May represent metastatic disease versus septic emboli. 3. Trace bilateral pleural effusions. Electronically Signed   By: Lucrezia Europe M.D.   On: 11/24/2016 11:45        Scheduled Meds: . aspirin EC  325 mg Oral Daily  . chlorthalidone  50 mg Oral Daily  . heparin  5,000 Units Subcutaneous Q8H  . Influenza vac split quadrivalent PF  0.5 mL Intramuscular Tomorrow-1000  . pneumococcal 23 valent vaccine  0.5 mL Intramuscular Tomorrow-1000  . regadenoson      . regadenoson  0.4 mg Intravenous Once   Continuous Infusions:   LOS: 0 days    Time spent: 25 min    [place], DO [facility] Pager [phone]  If 7PM-7AM, please contact  night-coverage [email or web address] Password [email or web address] 11/24/2016, 2:15 PM

## 2016-11-24 NOTE — Progress Notes (Signed)
Abnormal stress test result reviewed with Dr. Acie Fredrickson. Unclear what to make of result in setting of other pressing issues including possible septic emboli and positive cocaine use. Per review with MD, first focus will be evaluating with TEE. Patient aware this will not be able to be done until Wednesday. Wannetta Sender said she will work on Environmental manager. Spoke with patient and his wife about these results. Did not speak with patient about cocaine use as I did not have the opportunity to do so in private. The rounding team can review in AM with patient. Louay Myrie PA-C

## 2016-11-24 NOTE — Progress Notes (Signed)
Pt in [facility]- has had resting images; taken to CT afterwards for PE scan. Dr Acie Fredrickson notified to male sure pt is ok to resume stress test.

## 2016-11-24 NOTE — Progress Notes (Signed)
Reviewed CTA results with Dr. Acie Fredrickson. No PE but possibility of metastasis versus septic emboli. UDS also notably positive for cocaine. Pt presented with fever and CP therefore Dr. Acie Fredrickson feels he may need TEE. He said he will speak to Trish to schedule. While in nuc med I discussed with patient the concern of infection in his heart. The risks and benefits of transesophageal echocardiogram have been explained including risks of esophageal damage, perforation (1:10,000 risk), bleeding, pharyngeal hematoma as well as other potential complications associated with conscious sedation including aspiration, arrhythmia, respiratory failure and death. Patient seems disheartened to have to stay in hospital but willing to proceed. I wrote order for NPO after midnight, pending official scheduling.  Dr. Acie Fredrickson cleared him to proceed with nuc but overall this is becoming less important part of the picture. Nuc completed. No immediate complications.  Stress imaging is pending at this time. Preliminary EKG findings may be listed in the chart, but the stress test result will not be finalized until perfusion imaging is complete.  Charlie Pitter, PA-C 11/24/2016, 12:13 PM

## 2016-11-24 NOTE — Progress Notes (Signed)
  Echocardiogram 2D Echocardiogram has been performed.  Jennette Dubin 11/24/2016, 2:45 PM

## 2016-11-24 NOTE — Care Management Note (Addendum)
Case Management Note  Patient Details  Name: Jeremy Hood MRN: [id number] Date of Birth: 10/11/1976  Subjective/Objective:  Pt presented for chest pain. Pt is without insurance and PCP.                   Action/Plan: CM received referral in regards to medication assistance. CM did reach out to [employer] for [facility] @ the [employer] and [employer]. Jeremy Hood was able to establish an appointment 11-28-16 @ 0930. Placed Appointment on AVS. Pt will be able to utilize the Clinic's Pharmacy and cost ranges from $4.00-$10.00. CM will make patient aware. No further needs from CM at this time.   Expected Discharge Date:                  Expected Discharge Plan:  Home/Self Care  In-House Referral:  NA  Discharge planning Services  CM Consult, Follow-up appt scheduled, [facility], Medication Assistance  Post Acute Care Choice:  Home Health Choice offered to: Patient  DME Arranged:IV/Pump Equipment DME Agency: [place]: N/A [facility] Agency:N/A Status of Service:  Completed, signed off  If discussed at [place] Length of Stay Meetings, dates discussed:  12-04-16  Additional Comments: 12-05-16 0831 Jeremy Krauss, RN,BSN [phone] Pt can afford IV antibiotics @ $20.16 per day from [facility]. Liaison Jeremy Hood is trying to coordinate with the [facility] for weekly labs and [place]. CM will continue to monitor.    12-04-16 1051 Underwent CT 11-24-16 revealed Bilateral Pulmonary Nodules. TEE 2/7 negative for endocarditis. Pt has dental abscess- dentist to consult 12-04-16. Plan will be for home with IV Antibiotic therapy. CM did speak with pt in regards to possible IV antibiotics for home. Pt is agreeable to plan-MD states pt will not need SNF for IV therapy. CM did make referral with [facility] for [facility] and IV Antibiotic infusion. No further needs at this time. Will continue to monitor.    12-02-16 Pt has appt arranged with [place] on 12/08/16 @  3:45 pm. Pt can pick up meds from [facility]. NCM spoke to pt and unable to complete Bronchoscopy. Contacted attending to follow up on dc.   Contacted [facility] to reschedule hospital follow up. Did not have any appts for next week. Told rep that to keep appt on 12/08/2016.   1230 11-27-16 Jeremy Hood was able to reschedule an appointment for 12-01-16. If pt is still hospitalized appointment will need to be rescheduled. Jeremy Krauss, RN,BSN [id number] 11-27-16 Pt will not d/c today. CM did call Jeremy Hood liaison with [facility] to see if appointment can be rescheduled for later next week.  CM will continue to monitor.  Jeremy Roys, RN 11/24/2016, 11:29 AM

## 2016-11-24 NOTE — Progress Notes (Signed)
TEE will be done on Wednesday. 11/26/2016  Daune Perch, AGNP-C 2/5/20183:08 PM Pager: [phone]

## 2016-11-25 MED ORDER — POLYVINYL ALCOHOL 1.4 % OP SOLN: 1.0000 [drp] | OPHTHALMIC | Status: DC | PRN

## 2016-11-25 MED ORDER — MUSCLE RUB 10-15 % EX CREA: 1.0000 "application " | TOPICAL_CREAM | CUTANEOUS | Status: DC | PRN

## 2016-11-25 MED ORDER — TRAMADOL HCL 50 MG PO TABS
50.0000 mg | ORAL_TABLET | Freq: Four times a day (QID) | ORAL | Status: DC | PRN
  Administered 2016-11-25 – 2016-12-01 (×10): 50 mg via ORAL
  Filled 2016-11-25 (×10): qty 1

## 2016-11-25 MED ORDER — ALUM & MAG HYDROXIDE-SIMETH 200-200-20 MG/5ML PO SUSP: 30.0000 mL | ORAL | Status: DC | PRN

## 2016-11-25 MED ORDER — GUAIFENESIN-DM 100-10 MG/5ML PO SYRP
5.0000 mL | ORAL_SOLUTION | ORAL | Status: DC | PRN
Start: 2016-11-25 — End: 2016-12-05
  Administered 2016-11-25 – 2016-12-01 (×9): 5 mL via ORAL
  Filled 2016-11-25 (×9): qty 5

## 2016-11-25 MED ORDER — BLISTEX MEDICATED EX OINT
1.0000 "application " | TOPICAL_OINTMENT | CUTANEOUS | Status: DC | PRN
  Filled 2016-11-25: qty 6.3

## 2016-11-25 MED ORDER — PHENOL 1.4 % MT LIQD: 1.0000 | OROMUCOSAL | Status: DC | PRN

## 2016-11-25 MED ORDER — SALINE SPRAY 0.65 % NA SOLN: 1.0000 | NASAL | Status: DC | PRN

## 2016-11-25 NOTE — Progress Notes (Signed)
PROGRESS NOTE    Jeremy Hood  [id number] DOB: Dec 20, 1975 DOA: 11/23/2016 PCP: No PCP Per Patient   Outpatient Specialists:     Brief Narrative:  Jeremy Hood is a 41 y.o. male  pmh of tobacco abuse and uncontrolled hypertension presents emergency room with chief complaint of chest pain. Patient states the family this is ever happened before. Denies any chest trauma. Denies any recent coughing. Patient states that the pain came out of nowhere it is a pressure. Rates it as severe. Did not radiate. No nausea vomiting diaphoresis associated with. Patient does have some sweating at baseline. Patient has no family history of heart attack and family members less than age 61. Patient states that his chest pain does get worse with movement. No hx of VTE.  ED course: Patient given aspirin full dose. Also given 15mg  of Toradol. Chest x-ray negative. Hospitalists called for admission. Per MD req Cardio looked at EKG and cleared pt for standard obs admit.    Assessment & Plan:   Principal Problem:   Chest pain Active Problems:   Costochondritis   HTN (hypertension)   CP-  Stress test Echo: Left ventricle: The cavity size was normal. There was severe   concentric hypertrophy. Systolic function was normal. The   estimated ejection fraction was in the range of 55% to 60%. Wall   motion was normal; there were no regional wall motion   abnormalities. Doppler parameters are consistent with abnormal   left ventricular relaxation (grade 1 diastolic dysfunction).   Doppler parameters are consistent with elevated ventricular   end-diastolic filling pressure. -d dimer was elevated but CTA negative for clot-- did show nodules-- septic emboli or metastasis for TEE in AM- if negative will need CT Scans of chest/abd/pelvis and pulm consult  Low potassium Replete -recheck in AM  Costochondritis When necessary Flexeril  Cocaine abuse -encouraged cessation  Tobacco abuse Advised  smk cessation   DVT prophylaxis:  SQ Heparin  Code Status: Full Code   Family Communication: wife  Disposition Plan:     Consultants:   cards   Subjective: No SOB, no CP  Objective: Vitals:   11/24/16 1209 11/24/16 1544 11/24/16 2008 11/25/16 0500  BP: (!) 155/96 136/75 (!) 137/93 139/90  Pulse: 98 94 85 83  Resp:  15 (!) 22 20  Temp:  98.2 F (36.8 C) 99.3 F (37.4 C) 99.5 F (37.5 C)  TempSrc:  Oral Oral Oral  SpO2:  98% 95% 94%  Weight:    119.3 kg (263 lb 1.6 oz)  Height:        Intake/Output Summary (Last 24 hours) at 11/25/16 1247 Last data filed at 11/25/16 0900  Gross per 24 hour  Intake              480 ml  Output                0 ml  Net              480 ml   Filed Weights   11/24/16 0403 11/25/16 0500  Weight: 121.5 kg (267 lb 14.4 oz) 119.3 kg (263 lb 1.6 oz)    Examination:  General exam: Appears calm and comfortable  Respiratory system: Clear to auscultation. Respiratory effort normal. Cardiovascular system: S1 & S2 heard, RRR. No JVD, murmurs, rubs, gallops or clicks. No pedal edema. Gastrointestinal system: Abdomen is nondistended, soft and nontender. No organomegaly or masses felt. Normal bowel sounds heard. Central nervous system: Alert  and oriented. No focal neurological deficits.      Data Reviewed: I have personally reviewed following labs and imaging studies  CBC:  Recent Labs Lab 11/23/16 1037  WBC 8.4  HGB 11.8*  HCT 35.8*  MCV 87.1  PLT 123456   Basic Metabolic Panel:  Recent Labs Lab 11/23/16 1037 11/24/16 0354  NA 137 137  K 3.1* 3.3*  CL 102 102  CO2 23 27  GLUCOSE 146* 123*  BUN 16 10  CREATININE 1.26* 1.17  CALCIUM 8.6* 8.9  MG 2.0  --    GFR: Estimated Creatinine Clearance: 111.4 mL/min (by C-G formula based on SCr of 1.17 mg/dL). Liver Function Tests: No results for input(s): AST, ALT, ALKPHOS, BILITOT, PROT, ALBUMIN in the last 168 hours. No results for input(s): LIPASE, AMYLASE in the last  168 hours. No results for input(s): AMMONIA in the last 168 hours. Coagulation Profile: No results for input(s): INR, PROTIME in the last 168 hours. Cardiac Enzymes:  Recent Labs Lab 11/23/16 1745 11/24/16 0836 11/24/16 1402  TROPONINI 0.05* 0.04* 0.04*   BNP (last 3 results) No results for input(s): PROBNP in the last 8760 hours. HbA1C: No results for input(s): HGBA1C in the last 72 hours. CBG: No results for input(s): GLUCAP in the last 168 hours. Lipid Profile: No results for input(s): CHOL, HDL, LDLCALC, TRIG, CHOLHDL, LDLDIRECT in the last 72 hours. Thyroid Function Tests: No results for input(s): TSH, T4TOTAL, FREET4, T3FREE, THYROIDAB in the last 72 hours. Anemia Panel: No results for input(s): VITAMINB12, FOLATE, FERRITIN, TIBC, IRON, RETICCTPCT in the last 72 hours. Urine analysis: No results found for: COLORURINE, APPEARANCEUR, LABSPEC, [place], GLUCOSEU, HGBUR, BILIRUBINUR, KETONESUR, PROTEINUR, UROBILINOGEN, NITRITE, LEUKOCYTESUR   )No results found for this or any previous visit (from the past 240 hour(s)).    Anti-infectives    None       Radiology Studies: Ct Angio Chest Pe W Or Wo Contrast  Result Date: 11/24/2016 CLINICAL DATA:  Pt c/o left sided CP since last night, SOB Hx of HTN EXAM: CT ANGIOGRAPHY CHEST WITH CONTRAST TECHNIQUE: Multidetector CT imaging of the chest was performed using the standard protocol during bolus administration of intravenous contrast. Multiplanar CT image reconstructions and MIPs were obtained to evaluate the vascular anatomy. CONTRAST:  85cc isovue 370 COMPARISON:  None. FINDINGS: Cardiovascular: Left arm IV contrast administration. Innominate vein and SVC patent. Right atrium and right ventricle nondilated. Satisfactory opacification of pulmonary arteries noted, and there is no evidence of pulmonary emboli. Patent bilateral pulmonary veins drain into the left atrium. Adequate contrast opacification of the thoracic aorta with no  evidence of dissection, aneurysm, or stenosis. There is bovine variant brachiocephalic arch anatomy without proximal stenosis. Mediastinum/Nodes: No enlarged mediastinal, hilar, or axillary lymph nodes. Thyroid gland, trachea, and esophagus demonstrate no significant findings. Lungs/Pleura: Bilateral pulmonary nodules involving all lobes. The margins of most of the nodules are somewhat hazy and indistinct. None demonstrate calcification or definite cavitation. Representative lesions include 14 mm in the left apex, image 21/607 ; 12 mm pleural-based nodule, anterior right upper lobe image 43/607. There are patchy airspace opacities posteriorly in both lung bases probably a combination of pulmonary nodules and adjacent atelectasis/consolidation. Trace bilateral pleural effusions. No pneumothorax. Upper Abdomen: No acute abnormality. Musculoskeletal: Anterior vertebral endplate spurring at multiple levels in the mid and lower thoracic spine. Review of the MIP images confirms the above findings. IMPRESSION: 1. Negative for acute PE or thoracic aortic dissection. 2. Bilateral pulmonary nodules. May represent metastatic disease versus  septic emboli. 3. Trace bilateral pleural effusions. Electronically Signed   By: Lucrezia Europe M.D.   On: 11/24/2016 11:45   Nm Myocar Multi W/spect W/wall Motion / Ef  Result Date: 11/24/2016 CLINICAL DATA:  Chest pain, shortness of Breath EXAM: MYOCARDIAL IMAGING WITH SPECT (REST AND PHARMACOLOGIC-STRESS) GATED LEFT VENTRICULAR WALL MOTION STUDY LEFT VENTRICULAR EJECTION FRACTION TECHNIQUE: Standard myocardial SPECT imaging was performed after resting intravenous injection of 10 mCi Tc-23m tetrofosmin. Subsequently, intravenous infusion of Lexiscan was performed under the supervision of the Cardiology staff. At peak effect of the drug, 30 mCi Tc-45m tetrofosmin was injected intravenously and standard myocardial SPECT imaging was performed. Quantitative gated imaging was also performed to  evaluate left ventricular wall motion, and estimate left ventricular ejection fraction. COMPARISON:  None. FINDINGS: Perfusion: There is decreased activity noted anteriorly on the stress images, normal on the rest images, concerning for anterior wall ischemia Wall Motion: Mild generalized hypokinesia Left Ventricular Ejection Fraction: 43 % End diastolic volume [id number] ml End systolic volume 92 ml IMPRESSION: 1. Area of mild reversibility anteriorly concerning for ischemia. 2. Mild generalized hypokinesia. 3. Left ventricular ejection fraction 43% 4. Non invasive risk stratification*: High *2012 Appropriate Use Criteria for Coronary Revascularization Focused Update: J Am Coll Cardiol. [id number]. http://content.airportbarriers.com.aspx?articleid=[email or web address] Electronically Signed   By: Rolm Baptise M.D.   On: 11/24/2016 16:22        Scheduled Meds: . aspirin EC  325 mg Oral Daily  . chlorthalidone  50 mg Oral Daily  . heparin  5,000 Units Subcutaneous Q8H  . Influenza vac split quadrivalent PF  0.5 mL Intramuscular Tomorrow-1000  . pneumococcal 23 valent vaccine  0.5 mL Intramuscular Tomorrow-1000  . regadenoson  0.4 mg Intravenous Once   Continuous Infusions:   LOS: 0 days    Time spent: 25 min    [place], DO [facility] Pager [phone]  If 7PM-7AM, please contact night-coverage [email or web address] Password [facility] 11/25/2016, 12:47 PM

## 2016-11-25 NOTE — Progress Notes (Signed)
Progress Note  Patient Name: Jeremy Hood Date of Encounter: 11/25/2016  Primary Cardiologist: Saori Umholtz  Subjective   41 y.o. with past medical history significant for hypertension, untreated as pt has no PCP or insurance, who present to the [facility] ED for evaluation of chest pain.  D-dimer + CT angio was negative for Pulmonary embolus but did show several lung lesions c/w septic emboli vs. Cancer.   Scheduled for TEE tomorrow    Leane Call was ? Abn.   The reading suggests ant ischemia.   I think that there is chest wall attenuation and that the study may be normal.     Inpatient Medications    Scheduled Meds: . aspirin EC  325 mg Oral Daily  . chlorthalidone  50 mg Oral Daily  . heparin  5,000 Units Subcutaneous Q8H  . Influenza vac split quadrivalent PF  0.5 mL Intramuscular Tomorrow-1000  . pneumococcal 23 valent vaccine  0.5 mL Intramuscular Tomorrow-1000  . regadenoson  0.4 mg Intravenous Once   Continuous Infusions:  PRN Meds: acetaminophen, cyclobenzaprine, gi cocktail, hydrALAZINE, morphine injection, nitroGLYCERIN, ondansetron (ZOFRAN) IV, zolpidem   Vital Signs    Vitals:   11/24/16 1209 11/24/16 1544 11/24/16 2008 11/25/16 0500  BP: (!) 155/96 136/75 (!) 137/93 139/90  Pulse: 98 94 85 83  Resp:  15 (!) 22 20  Temp:  98.2 F (36.8 C) 99.3 F (37.4 C) 99.5 F (37.5 C)  TempSrc:  Oral Oral Oral  SpO2:  98% 95% 94%  Weight:    263 lb 1.6 oz (119.3 kg)  Height:        Intake/Output Summary (Last 24 hours) at 11/25/16 0836 Last data filed at 11/24/16 1848  Gross per 24 hour  Intake              240 ml  Output              600 ml  Net             -360 ml   Filed Weights   11/24/16 0403 11/25/16 0500  Weight: 267 lb 14.4 oz (121.5 kg) 263 lb 1.6 oz (119.3 kg)    Telemetry    NSR  - Personally Reviewed  ECG       Physical Exam   GEN: No acute distress.   Neck: No JVD Cardiac: RRR, no murmurs, rubs, or gallops.  Respiratory:  Clear to auscultation bilaterally. GI: Soft, nontender, non-distended  MS: No edema; No deformity. Neuro:  Nonfocal  Psych: Normal affect   Labs    Chemistry Recent Labs Lab 11/23/16 1037 11/24/16 0354  NA 137 137  K 3.1* 3.3*  CL 102 102  CO2 23 27  GLUCOSE 146* 123*  BUN 16 10  CREATININE 1.26* 1.17  CALCIUM 8.6* 8.9  GFRNONAA >60 >60  GFRAA >60 >60  ANIONGAP 12 8     Hematology Recent Labs Lab 11/23/16 1037  WBC 8.4  RBC 4.11*  HGB 11.8*  HCT 35.8*  MCV 87.1  MCH 28.7  MCHC 33.0  RDW 14.4  PLT 280    Cardiac Enzymes Recent Labs Lab 11/23/16 1745 11/24/16 0836 11/24/16 1402  TROPONINI 0.05* 0.04* 0.04*    Recent Labs Lab 11/23/16 1114 11/23/16 1544  TROPIPOC 0.04 0.03     BNPNo results for input(s): BNP, PROBNP in the last 168 hours.   DDimer  Recent Labs Lab 11/24/16 [phone]  DDIMER 1.13*     Radiology  Dg Chest 2 View  Result Date: 11/23/2016 CLINICAL DATA:  41 year old male with its sharp central chest pain onset this morning. Pleuritic pain. Initial encounter. EXAM: CHEST  2 VIEW COMPARISON:  Chest radiographs 09/25/2007. FINDINGS: Lower lung volumes with crowding of lung markings and extend to a shin of cardiac size. Other mediastinal contours are within normal limits. Visualized tracheal air column is within normal limits. No pneumothorax or pleural effusion. No pulmonary edema suspected. No confluent pulmonary opacity. Negative visible bowel gas pattern. No acute osseous abnormality identified. IMPRESSION: Low lung volumes.  No acute cardiopulmonary abnormality. Electronically Signed   By: Genevie Ann M.D.   On: 11/23/2016 11:28   Ct Angio Chest Pe W Or Wo Contrast  Result Date: 11/24/2016 CLINICAL DATA:  Pt c/o left sided CP since last night, SOB Hx of HTN EXAM: CT ANGIOGRAPHY CHEST WITH CONTRAST TECHNIQUE: Multidetector CT imaging of the chest was performed using the standard protocol during bolus administration of intravenous contrast.  Multiplanar CT image reconstructions and MIPs were obtained to evaluate the vascular anatomy. CONTRAST:  85cc isovue 370 COMPARISON:  None. FINDINGS: Cardiovascular: Left arm IV contrast administration. Innominate vein and SVC patent. Right atrium and right ventricle nondilated. Satisfactory opacification of pulmonary arteries noted, and there is no evidence of pulmonary emboli. Patent bilateral pulmonary veins drain into the left atrium. Adequate contrast opacification of the thoracic aorta with no evidence of dissection, aneurysm, or stenosis. There is bovine variant brachiocephalic arch anatomy without proximal stenosis. Mediastinum/Nodes: No enlarged mediastinal, hilar, or axillary lymph nodes. Thyroid gland, trachea, and esophagus demonstrate no significant findings. Lungs/Pleura: Bilateral pulmonary nodules involving all lobes. The margins of most of the nodules are somewhat hazy and indistinct. None demonstrate calcification or definite cavitation. Representative lesions include 14 mm in the left apex, image 21/607 ; 12 mm pleural-based nodule, anterior right upper lobe image 43/607. There are patchy airspace opacities posteriorly in both lung bases probably a combination of pulmonary nodules and adjacent atelectasis/consolidation. Trace bilateral pleural effusions. No pneumothorax. Upper Abdomen: No acute abnormality. Musculoskeletal: Anterior vertebral endplate spurring at multiple levels in the mid and lower thoracic spine. Review of the MIP images confirms the above findings. IMPRESSION: 1. Negative for acute PE or thoracic aortic dissection. 2. Bilateral pulmonary nodules. May represent metastatic disease versus septic emboli. 3. Trace bilateral pleural effusions. Electronically Signed   By: Lucrezia Europe M.D.   On: 11/24/2016 11:45   Nm Myocar Multi W/spect W/wall Motion / Ef  Result Date: 11/24/2016 CLINICAL DATA:  Chest pain, shortness of Breath EXAM: MYOCARDIAL IMAGING WITH SPECT (REST AND  PHARMACOLOGIC-STRESS) GATED LEFT VENTRICULAR WALL MOTION STUDY LEFT VENTRICULAR EJECTION FRACTION TECHNIQUE: Standard myocardial SPECT imaging was performed after resting intravenous injection of 10 mCi Tc-17m tetrofosmin. Subsequently, intravenous infusion of Lexiscan was performed under the supervision of the Cardiology staff. At peak effect of the drug, 30 mCi Tc-80m tetrofosmin was injected intravenously and standard myocardial SPECT imaging was performed. Quantitative gated imaging was also performed to evaluate left ventricular wall motion, and estimate left ventricular ejection fraction. COMPARISON:  None. FINDINGS: Perfusion: There is decreased activity noted anteriorly on the stress images, normal on the rest images, concerning for anterior wall ischemia Wall Motion: Mild generalized hypokinesia Left Ventricular Ejection Fraction: 43 % End diastolic volume Q000111Q ml End systolic volume 92 ml IMPRESSION: 1. Area of mild reversibility anteriorly concerning for ischemia. 2. Mild generalized hypokinesia. 3. Left ventricular ejection fraction 43% 4. Non invasive risk stratification*: High *2012 Appropriate Use Criteria  for Coronary Revascularization Focused Update: J Am Coll Cardiol. [id number]. [email or web address]://[email or web address]?[email or web address] Electronically Signed   By: Rolm Baptise M.D.   On: 11/24/2016 16:22    Cardiac Studies   1. Lexiscan myoview:    My intrepretation is that the mild anterior attenuation is chest wall artifact.   He has a large chest .  Symptoms are not c/w ischemia now that I have talked with him futher .    Will put this on the back burner for now  2. CT angio:   Has multiple lung lesions.  ? Septic emboli.  Denies IVDA. For TEE tomorrow Further eval per Dr. Eliseo Squires.  - ? Need for pulmonary consult.   May need bronch vs. VATS bx.     Patient Profile     41 y.o. male , atypical CP ,  Fevers, sweats,   Assessment & Plan    1   Chest pain   . Lexiscan  myoview was read as having mild ant. Ischemia :    My intrepretation is that the mild anterior attenuation is chest wall artifact.   He has a large chest .  Symptoms are not c/w ischemia now that I have talked with him futher .    Will put this on the back burner for now  2. Lung lesions. CT angio:   Has multiple lung lesions.  ? Septic emboli.  Denies IVDA. For TEE tomorrow Further eval per Dr. Eliseo Squires.  - ? Need for pulmonary consult.   May need bronch vs. VATS bx.   blood cultures.    Signed, Mertie Moores, MD  11/25/2016, 8:36 AM

## 2016-11-26 ENCOUNTER — Inpatient Hospital Stay (HOSPITAL_COMMUNITY): Payer: Self-pay | Admitting: Anesthesiology

## 2016-11-26 ENCOUNTER — Inpatient Hospital Stay (HOSPITAL_COMMUNITY): Payer: Self-pay

## 2016-11-26 ENCOUNTER — Encounter (HOSPITAL_COMMUNITY)
Admission: EM | Disposition: A | Discharge status: Home / Self Care | Payer: Self-pay | Source: Home / Self Care | Attending: Internal Medicine

## 2016-11-26 ENCOUNTER — Encounter (HOSPITAL_COMMUNITY): Payer: Self-pay | Admitting: Certified Registered"

## 2016-11-26 DIAGNOSIS — F1910 Other psychoactive substance abuse, uncomplicated: Secondary | ICD-10-CM

## 2016-11-26 DIAGNOSIS — I10 Essential (primary) hypertension: Secondary | ICD-10-CM

## 2016-11-26 DIAGNOSIS — R911 Solitary pulmonary nodule: Secondary | ICD-10-CM

## 2016-11-26 DIAGNOSIS — R059 Cough, unspecified: Secondary | ICD-10-CM

## 2016-11-26 DIAGNOSIS — I76 Septic arterial embolism: Secondary | ICD-10-CM

## 2016-11-26 DIAGNOSIS — Z720 Tobacco use: Secondary | ICD-10-CM

## 2016-11-26 DIAGNOSIS — R7881 Bacteremia: Secondary | ICD-10-CM

## 2016-11-26 DIAGNOSIS — R918 Other nonspecific abnormal finding of lung field: Secondary | ICD-10-CM

## 2016-11-26 DIAGNOSIS — I2690 Septic pulmonary embolism without acute cor pulmonale: Secondary | ICD-10-CM

## 2016-11-26 DIAGNOSIS — R05 Cough: Secondary | ICD-10-CM

## 2016-11-26 HISTORY — PX: TEE WITHOUT CARDIOVERSION: SHX5443

## 2016-11-26 LAB — BASIC METABOLIC PANEL
Anion gap: 14 (ref 5–15)
BUN: 17 mg/dL (ref 6–20)
CALCIUM: 9.1 mg/dL (ref 8.9–10.3)
CHLORIDE: 93 mmol/L — AB (ref 101–111)
CO2: 27 mmol/L (ref 22–32)
Creatinine, Ser: 1.24 mg/dL (ref 0.61–1.24)
GFR calc non Af Amer: >60 mL/min (ref >60)
Glucose, Bld: 124 mg/dL — ABNORMAL HIGH (ref 65–99)
Potassium: 3.3 mmol/L — ABNORMAL LOW (ref 3.5–5.1)
Sodium: 134 mmol/L — ABNORMAL LOW (ref 135–145)

## 2016-11-26 LAB — PROCALCITONIN: Procalcitonin: 0.11 ng/mL

## 2016-11-26 LAB — CBC
HEMATOCRIT: 36.6 % — AB (ref 39.0–52.0)
HEMOGLOBIN: 11.9 g/dL — AB (ref 13.0–17.0)
MCH: 27.9 pg (ref 26.0–34.0)
MCHC: 32.5 g/dL (ref 30.0–36.0)
MCV: 85.7 fL (ref 78.0–100.0)
Platelets: 335 10*3/uL (ref 150–400)
RBC: 4.27 MIL/uL (ref 4.22–5.81)
RDW: 14.4 % (ref 11.5–15.5)
WBC: 13.2 10*3/uL — ABNORMAL HIGH (ref 4.0–10.5)

## 2016-11-26 SURGERY — ECHOCARDIOGRAM, TRANSESOPHAGEAL
Anesthesia: Monitor Anesthesia Care

## 2016-11-26 MED ORDER — PROPOFOL 10 MG/ML IV BOLUS
INTRAVENOUS | Status: DC | PRN
  Administered 2016-11-26 (×2): 20 mg via INTRAVENOUS
  Administered 2016-11-26: 40 mg via INTRAVENOUS
  Administered 2016-11-26 (×2): 20 mg via INTRAVENOUS

## 2016-11-26 MED ORDER — BUTAMBEN-TETRACAINE-BENZOCAINE 2-2-14 % EX AERO
INHALATION_SPRAY | CUTANEOUS | Status: DC | PRN
  Administered 2016-11-26: 2 via TOPICAL

## 2016-11-26 MED ORDER — POTASSIUM CHLORIDE CRYS ER 20 MEQ PO TBCR
40.0000 meq | EXTENDED_RELEASE_TABLET | Freq: Two times a day (BID) | ORAL | Status: DC
  Administered 2016-11-26 – 2016-12-04 (×18): 40 meq via ORAL
  Filled 2016-11-26 (×19): qty 2

## 2016-11-26 MED ORDER — LACTATED RINGERS IV SOLN
INTRAVENOUS | Status: DC | PRN
  Administered 2016-11-26: 13:00:00 via INTRAVENOUS

## 2016-11-26 MED ORDER — PROPOFOL 500 MG/50ML IV EMUL
INTRAVENOUS | Status: DC | PRN
  Administered 2016-11-26: 100 ug/kg/min via INTRAVENOUS

## 2016-11-26 NOTE — CV Procedure (Signed)
    Transesophageal Echocardiogram Note  Jeremy Hood [id number] [birth year]  Procedure: Transesophageal Echocardiogram Indications: pulmonary septic emboli   Procedure Details Consent: Obtained Time Out: Verified patient identification, verified procedure, site/side was marked, verified correct patient position, special equipment/implants available, Radiology Safety Procedures followed,  medications/allergies/relevent history reviewed, required imaging and test results available.  Performed  Medications:  During this procedure the patient is administered a total Propfol 390 mg from anesthesia.   Left Ventrical:  Normal LV function   Mitral Valve: normal   Aortic Valve: normal   Tricuspid Valve: normal , no vegetation   Pulmonic Valve: normal , no vegetation   Left Atrium/ Left atrial appendage: normal , no thrombi   Atrial septum: intact by color flow   Aorta: normal   Complications: No apparent complications Patient did tolerate procedure well.   Thayer Headings, Brooke Bonito., MD, [facility] 11/26/2016, 1:19 PM

## 2016-11-26 NOTE — Progress Notes (Signed)
    [facility] has been requested to perform a transesophageal echocardiogram on Jeremy Hood for septic emboli in lungs.  After careful review of history and examination, the risks and benefits of transesophageal echocardiogram have been explained including risks of esophageal damage, perforation (1:10,000 risk), bleeding, pharyngeal hematoma as well as other potential complications associated with conscious sedation including aspiration, arrhythmia, respiratory failure and death. Alternatives to treatment were discussed, questions were answered. Patient is willing to proceed.   Pt is scheduled for TEE today pending availability of anesthesia.  Daune Perch, NP  11/26/2016 8:38 AM

## 2016-11-26 NOTE — Progress Notes (Signed)
Progress Note  Patient Name: Jeremy Hood Date of Encounter: 11/26/2016  Primary Cardiologist: Nahser  Subjective   Pt denies chest pain or shortness of breath. Is still complaining of bilateral leg soreness "like fever in my legs".  Inpatient Medications    Scheduled Meds: . aspirin EC  325 mg Oral Daily  . chlorthalidone  50 mg Oral Daily  . heparin  5,000 Units Subcutaneous Q8H  . Influenza vac split quadrivalent PF  0.5 mL Intramuscular Tomorrow-1000  . pneumococcal 23 valent vaccine  0.5 mL Intramuscular Tomorrow-1000  . regadenoson  0.4 mg Intravenous Once   Continuous Infusions:  PRN Meds: acetaminophen, alum & mag hydroxide-simeth, cyclobenzaprine, gi cocktail, guaiFENesin-dextromethorphan, hydrALAZINE, lip balm, morphine injection, MUSCLE RUB, nitroGLYCERIN, ondansetron (ZOFRAN) IV, phenol, polyvinyl alcohol, sodium chloride, traMADol, zolpidem   Vital Signs    Vitals:   11/26/16 0100 11/26/16 0400 11/26/16 0419 11/26/16 0420  BP: (!) 144/98  (!) 145/89   Pulse: 84  92   Resp:   16   Temp:  99.7 F (37.6 C)    TempSrc:  Oral    SpO2:      Weight:    264 lb 5.3 oz (119.9 kg)  Height:        Intake/Output Summary (Last 24 hours) at 11/26/16 0840 Last data filed at 11/25/16 1818  Gross per 24 hour  Intake              720 ml  Output                0 ml  Net              720 ml   Filed Weights   11/24/16 0403 11/25/16 0500 11/26/16 0420  Weight: 267 lb 14.4 oz (121.5 kg) 263 lb 1.6 oz (119.3 kg) 264 lb 5.3 oz (119.9 kg)    Telemetry    NSR  80's-100 with occ bigeminy - Personally Reviewed  ECG     No new tracings  Physical Exam   GEN: No acute distress.   Neck: No JVD Cardiac: RRR, no murmurs, rubs, or gallops.  Respiratory: Clear to auscultation bilaterally. GI: Soft, nontender, non-distended  MS: No edema; No deformity. Neuro:  Nonfocal  Psych: Normal affect   Labs    Chemistry  Recent Labs Lab 11/23/16 1037 11/24/16 0354  11/26/16 0436  NA 137 137 134*  K 3.1* 3.3* 3.3*  CL 102 102 93*  CO2 23 27 27   GLUCOSE 146* 123* 124*  BUN 16 10 17   CREATININE 1.26* 1.17 1.24  CALCIUM 8.6* 8.9 9.1  GFRNONAA >60 >60 >60  GFRAA >60 >60 >60  ANIONGAP 12 8 14      Hematology  Recent Labs Lab 11/23/16 1037 11/26/16 0436  WBC 8.4 13.2*  RBC 4.11* 4.27  HGB 11.8* 11.9*  HCT 35.8* 36.6*  MCV 87.1 85.7  MCH 28.7 27.9  MCHC 33.0 32.5  RDW 14.4 14.4  PLT 280 335    Cardiac Enzymes  Recent Labs Lab 11/23/16 1745 11/24/16 0836 11/24/16 1402  TROPONINI 0.05* 0.04* 0.04*     Recent Labs Lab 11/23/16 1114 11/23/16 1544  TROPIPOC 0.04 0.03     BNPNo results for input(s): BNP, PROBNP in the last 168 hours.   DDimer   Recent Labs Lab 11/24/16 [phone]  DDIMER 1.13*     Radiology    Ct Angio Chest Pe W Or Wo Contrast  Result Date: 11/24/2016 CLINICAL DATA:  Pt c/o  left sided CP since last night, SOB Hx of HTN EXAM: CT ANGIOGRAPHY CHEST WITH CONTRAST TECHNIQUE: Multidetector CT imaging of the chest was performed using the standard protocol during bolus administration of intravenous contrast. Multiplanar CT image reconstructions and MIPs were obtained to evaluate the vascular anatomy. CONTRAST:  85cc isovue 370 COMPARISON:  None. FINDINGS: Cardiovascular: Left arm IV contrast administration. Innominate vein and SVC patent. Right atrium and right ventricle nondilated. Satisfactory opacification of pulmonary arteries noted, and there is no evidence of pulmonary emboli. Patent bilateral pulmonary veins drain into the left atrium. Adequate contrast opacification of the thoracic aorta with no evidence of dissection, aneurysm, or stenosis. There is bovine variant brachiocephalic arch anatomy without proximal stenosis. Mediastinum/Nodes: No enlarged mediastinal, hilar, or axillary lymph nodes. Thyroid gland, trachea, and esophagus demonstrate no significant findings. Lungs/Pleura: Bilateral pulmonary nodules involving  all lobes. The margins of most of the nodules are somewhat hazy and indistinct. None demonstrate calcification or definite cavitation. Representative lesions include 14 mm in the left apex, image 21/607 ; 12 mm pleural-based nodule, anterior right upper lobe image 43/607. There are patchy airspace opacities posteriorly in both lung bases probably a combination of pulmonary nodules and adjacent atelectasis/consolidation. Trace bilateral pleural effusions. No pneumothorax. Upper Abdomen: No acute abnormality. Musculoskeletal: Anterior vertebral endplate spurring at multiple levels in the mid and lower thoracic spine. Review of the MIP images confirms the above findings. IMPRESSION: 1. Negative for acute PE or thoracic aortic dissection. 2. Bilateral pulmonary nodules. May represent metastatic disease versus septic emboli. 3. Trace bilateral pleural effusions. Electronically Signed   By: Lucrezia Europe M.D.   On: 11/24/2016 11:45   Nm Myocar Multi W/spect W/wall Motion / Ef  Result Date: 11/24/2016 CLINICAL DATA:  Chest pain, shortness of Breath EXAM: MYOCARDIAL IMAGING WITH SPECT (REST AND PHARMACOLOGIC-STRESS) GATED LEFT VENTRICULAR WALL MOTION STUDY LEFT VENTRICULAR EJECTION FRACTION TECHNIQUE: Standard myocardial SPECT imaging was performed after resting intravenous injection of 10 mCi Tc-25m tetrofosmin. Subsequently, intravenous infusion of Lexiscan was performed under the supervision of the Cardiology staff. At peak effect of the drug, 30 mCi Tc-12m tetrofosmin was injected intravenously and standard myocardial SPECT imaging was performed. Quantitative gated imaging was also performed to evaluate left ventricular wall motion, and estimate left ventricular ejection fraction. COMPARISON:  None. FINDINGS: Perfusion: There is decreased activity noted anteriorly on the stress images, normal on the rest images, concerning for anterior wall ischemia Wall Motion: Mild generalized hypokinesia Left Ventricular Ejection  Fraction: 43 % End diastolic volume Q000111Q ml End systolic volume 92 ml IMPRESSION: 1. Area of mild reversibility anteriorly concerning for ischemia. 2. Mild generalized hypokinesia. 3. Left ventricular ejection fraction 43% 4. Non invasive risk stratification*: High *2012 Appropriate Use Criteria for Coronary Revascularization Focused Update: J Am Coll Cardiol. [id number]. [email or web address] Electronically Signed   By: Rolm Baptise M.D.   On: 11/24/2016 16:22    Cardiac Studies   1. Lexiscan myoview:    My intrepretation is that the mild anterior attenuation is chest wall artifact.   He has a large chest .  Symptoms are not c/w ischemia now that I have talked with him futher .    Will put this on the back burner for now  2. CT angio:   Has multiple lung lesions.  ? Septic emboli.  Denies IVDA. For TEE tomorrow Further eval per Dr. Eliseo Squires.  - ? Need for pulmonary consult.   May need bronch vs. VATS bx.   3. Echo 11/24/2016 Study Conclusions -  Left ventricle: The cavity size was normal. There was severe   concentric hypertrophy. Systolic function was normal. The   estimated ejection fraction was in the range of 55% to 60%. Wall   motion was normal; there were no regional wall motion   abnormalities. Doppler parameters are consistent with abnormal   left ventricular relaxation (grade 1 diastolic dysfunction).   Doppler parameters are consistent with elevated ventricular   end-diastolic filling pressure. - Aortic valve: Trileaflet; normal thickness leaflets. There was no   regurgitation. - Mitral valve: Structurally normal valve. There was no   regurgitation. - Tricuspid valve: There was trivial regurgitation. - Pulmonic valve: There was trivial regurgitation. - Pulmonary arteries: Systolic pressure was within the normal   range. - Inferior vena cava: The vessel was normal in size. - Pericardium, extracardiac: There was no pericardial  effusion.  Patient Profile     41 y.o. male , with past medical history significant for hypertension, untreated as pt has no PCP or insurance, who present to the [facility] ED for evaluation of chest pain, fever, sweats.  Assessment & Plan    1   Chest pain   . Lexiscan myoview was read as having mild ant. Ischemia :  intrepretation by Dr. Acie Fredrickson is that the mild anterior attenuation is chest wall artifact.  He has a large chest .  Symptoms are not c/w ischemia. Will put this on the back burner for now  2. Lung lesions. CT angio:   Has multiple lung lesions.  ? Septic emboli.  Denies IVDA. For TEE today pending arrangement of anesthesia Further eval per Dr. Eliseo Squires.  - ? Need for pulmonary consult.   May need bronch vs. VATS bx.   blood cultures.   3. Hypokalemia -Occasional bigeminy -Will replace with 32mEq KDur.  Signed, Daune Perch, NP  11/26/2016, 8:40 AM    Attending Note:   The patient was seen and examined.  Agree with assessment and plan as noted above.  Changes made to the above note as needed.  Patient seen and independently examined with Pecolia Ades, NP .   We discussed all aspects of the encounter. I agree with the assessment and plan as stated above.  1.  Chest pain - atypical .  .  Doubt this is angina  2.  Lung lesions - ? Septic emboli Unfortunately, we will not be able to do the TEE until Friday. Were hoping that there may be a cancellation today. Blood cultures are pending. I think that we should get pulmonary involved in helping [place] with this evaluation.    I have spent a total of 40 minutes with patient reviewing hospital  notes , telemetry, EKGs, labs and examining patient as well as establishing an assessment and plan that was discussed with the patient. > 50% of time was spent in direct patient care.    Thayer Headings, Brooke Bonito., MD, [facility] 11/26/2016, 9:22 AM [address],  [place] Pager [phone]

## 2016-11-26 NOTE — H&P (View-Only) (Signed)
Progress Note  Patient Name: Jeremy Hood Date of Encounter: 11/26/2016  Primary Cardiologist: Nahser  Subjective   Pt denies chest pain or shortness of breath. Is still complaining of bilateral leg soreness "like fever in my legs".  Inpatient Medications    Scheduled Meds: . aspirin EC  325 mg Oral Daily  . chlorthalidone  50 mg Oral Daily  . heparin  5,000 Units Subcutaneous Q8H  . Influenza vac split quadrivalent PF  0.5 mL Intramuscular Tomorrow-1000  . pneumococcal 23 valent vaccine  0.5 mL Intramuscular Tomorrow-1000  . regadenoson  0.4 mg Intravenous Once   Continuous Infusions:  PRN Meds: acetaminophen, alum & mag hydroxide-simeth, cyclobenzaprine, gi cocktail, guaiFENesin-dextromethorphan, hydrALAZINE, lip balm, morphine injection, MUSCLE RUB, nitroGLYCERIN, ondansetron (ZOFRAN) IV, phenol, polyvinyl alcohol, sodium chloride, traMADol, zolpidem   Vital Signs    Vitals:   11/26/16 0100 11/26/16 0400 11/26/16 0419 11/26/16 0420  BP: (!) 144/98  (!) 145/89   Pulse: 84  92   Resp:   16   Temp:  99.7 F (37.6 C)    TempSrc:  Oral    SpO2:      Weight:    264 lb 5.3 oz (119.9 kg)  Height:        Intake/Output Summary (Last 24 hours) at 11/26/16 0840 Last data filed at 11/25/16 1818  Gross per 24 hour  Intake              720 ml  Output                0 ml  Net              720 ml   Filed Weights   11/24/16 0403 11/25/16 0500 11/26/16 0420  Weight: 267 lb 14.4 oz (121.5 kg) 263 lb 1.6 oz (119.3 kg) 264 lb 5.3 oz (119.9 kg)    Telemetry    NSR  80's-100 with occ bigeminy - Personally Reviewed  ECG     No new tracings  Physical Exam   GEN: No acute distress.   Neck: No JVD Cardiac: RRR, no murmurs, rubs, or gallops.  Respiratory: Clear to auscultation bilaterally. GI: Soft, nontender, non-distended  MS: No edema; No deformity. Neuro:  Nonfocal  Psych: Normal affect   Labs    Chemistry  Recent Labs Lab 11/23/16 1037 11/24/16 0354  11/26/16 0436  NA 137 137 134*  K 3.1* 3.3* 3.3*  CL 102 102 93*  CO2 23 27 27   GLUCOSE 146* 123* 124*  BUN 16 10 17   CREATININE 1.26* 1.17 1.24  CALCIUM 8.6* 8.9 9.1  GFRNONAA >60 >60 >60  GFRAA >60 >60 >60  ANIONGAP 12 8 14      Hematology  Recent Labs Lab 11/23/16 1037 11/26/16 0436  WBC 8.4 13.2*  RBC 4.11* 4.27  HGB 11.8* 11.9*  HCT 35.8* 36.6*  MCV 87.1 85.7  MCH 28.7 27.9  MCHC 33.0 32.5  RDW 14.4 14.4  PLT 280 335    Cardiac Enzymes  Recent Labs Lab 11/23/16 1745 11/24/16 0836 11/24/16 1402  TROPONINI 0.05* 0.04* 0.04*     Recent Labs Lab 11/23/16 1114 11/23/16 1544  TROPIPOC 0.04 0.03     BNPNo results for input(s): BNP, PROBNP in the last 168 hours.   DDimer   Recent Labs Lab 11/24/16 [phone]  DDIMER 1.13*     Radiology    Ct Angio Chest Pe W Or Wo Contrast  Result Date: 11/24/2016 CLINICAL DATA:  Pt c/o  left sided CP since last night, SOB Hx of HTN EXAM: CT ANGIOGRAPHY CHEST WITH CONTRAST TECHNIQUE: Multidetector CT imaging of the chest was performed using the standard protocol during bolus administration of intravenous contrast. Multiplanar CT image reconstructions and MIPs were obtained to evaluate the vascular anatomy. CONTRAST:  85cc isovue 370 COMPARISON:  None. FINDINGS: Cardiovascular: Left arm IV contrast administration. Innominate vein and SVC patent. Right atrium and right ventricle nondilated. Satisfactory opacification of pulmonary arteries noted, and there is no evidence of pulmonary emboli. Patent bilateral pulmonary veins drain into the left atrium. Adequate contrast opacification of the thoracic aorta with no evidence of dissection, aneurysm, or stenosis. There is bovine variant brachiocephalic arch anatomy without proximal stenosis. Mediastinum/Nodes: No enlarged mediastinal, hilar, or axillary lymph nodes. Thyroid gland, trachea, and esophagus demonstrate no significant findings. Lungs/Pleura: Bilateral pulmonary nodules involving  all lobes. The margins of most of the nodules are somewhat hazy and indistinct. None demonstrate calcification or definite cavitation. Representative lesions include 14 mm in the left apex, image 21/607 ; 12 mm pleural-based nodule, anterior right upper lobe image 43/607. There are patchy airspace opacities posteriorly in both lung bases probably a combination of pulmonary nodules and adjacent atelectasis/consolidation. Trace bilateral pleural effusions. No pneumothorax. Upper Abdomen: No acute abnormality. Musculoskeletal: Anterior vertebral endplate spurring at multiple levels in the mid and lower thoracic spine. Review of the MIP images confirms the above findings. IMPRESSION: 1. Negative for acute PE or thoracic aortic dissection. 2. Bilateral pulmonary nodules. May represent metastatic disease versus septic emboli. 3. Trace bilateral pleural effusions. Electronically Signed   By: Lucrezia Europe M.D.   On: 11/24/2016 11:45   Nm Myocar Multi W/spect W/wall Motion / Ef  Result Date: 11/24/2016 CLINICAL DATA:  Chest pain, shortness of Breath EXAM: MYOCARDIAL IMAGING WITH SPECT (REST AND PHARMACOLOGIC-STRESS) GATED LEFT VENTRICULAR WALL MOTION STUDY LEFT VENTRICULAR EJECTION FRACTION TECHNIQUE: Standard myocardial SPECT imaging was performed after resting intravenous injection of 10 mCi Tc-74m tetrofosmin. Subsequently, intravenous infusion of Lexiscan was performed under the supervision of the Cardiology staff. At peak effect of the drug, 30 mCi Tc-19m tetrofosmin was injected intravenously and standard myocardial SPECT imaging was performed. Quantitative gated imaging was also performed to evaluate left ventricular wall motion, and estimate left ventricular ejection fraction. COMPARISON:  None. FINDINGS: Perfusion: There is decreased activity noted anteriorly on the stress images, normal on the rest images, concerning for anterior wall ischemia Wall Motion: Mild generalized hypokinesia Left Ventricular Ejection  Fraction: 43 % End diastolic volume Q000111Q ml End systolic volume 92 ml IMPRESSION: 1. Area of mild reversibility anteriorly concerning for ischemia. 2. Mild generalized hypokinesia. 3. Left ventricular ejection fraction 43% 4. Non invasive risk stratification*: High *2012 Appropriate Use Criteria for Coronary Revascularization Focused Update: J Am Coll Cardiol. N6492421. http://[email or web address]?[email or web address] Electronically Signed   By: Rolm Baptise M.D.   On: 11/24/2016 16:22    Cardiac Studies   1. Lexiscan myoview:    My intrepretation is that the mild anterior attenuation is chest wall artifact.   He has a large chest .  Symptoms are not c/w ischemia now that I have talked with him futher .    Will put this on the back burner for now  2. CT angio:   Has multiple lung lesions.  ? Septic emboli.  Denies IVDA. For TEE tomorrow Further eval per Dr. Eliseo Squires.  - ? Need for pulmonary consult.   May need bronch vs. VATS bx.   3. Echo 11/24/2016 Study Conclusions -  Left ventricle: The cavity size was normal. There was severe   concentric hypertrophy. Systolic function was normal. The   estimated ejection fraction was in the range of 55% to 60%. Wall   motion was normal; there were no regional wall motion   abnormalities. Doppler parameters are consistent with abnormal   left ventricular relaxation (grade 1 diastolic dysfunction).   Doppler parameters are consistent with elevated ventricular   end-diastolic filling pressure. - Aortic valve: Trileaflet; normal thickness leaflets. There was no   regurgitation. - Mitral valve: Structurally normal valve. There was no   regurgitation. - Tricuspid valve: There was trivial regurgitation. - Pulmonic valve: There was trivial regurgitation. - Pulmonary arteries: Systolic pressure was within the normal   range. - Inferior vena cava: The vessel was normal in size. - Pericardium, extracardiac: There was no pericardial  effusion.  Patient Profile     41 y.o. male , with past medical history significant for hypertension, untreated as pt has no PCP or insurance, who present to the [facility] ED for evaluation of chest pain, fever, sweats.  Assessment & Plan    1   Chest pain   . Lexiscan myoview was read as having mild ant. Ischemia :  intrepretation by Dr. Acie Fredrickson is that the mild anterior attenuation is chest wall artifact.  He has a large chest .  Symptoms are not c/w ischemia. Will put this on the back burner for now  2. Lung lesions. CT angio:   Has multiple lung lesions.  ? Septic emboli.  Denies IVDA. For TEE today pending arrangement of anesthesia Further eval per Dr. Eliseo Squires.  - ? Need for pulmonary consult.   May need bronch vs. VATS bx.   blood cultures.   3. Hypokalemia -Occasional bigeminy -Will replace with 83mEq KDur.  Signed, Daune Perch, NP  11/26/2016, 8:40 AM    Attending Note:   The patient was seen and examined.  Agree with assessment and plan as noted above.  Changes made to the above note as needed.  Patient seen and independently examined with Pecolia Ades, NP .   We discussed all aspects of the encounter. I agree with the assessment and plan as stated above.  1.  Chest pain - atypical .  .  Doubt this is angina  2.  Lung lesions - ? Septic emboli Unfortunately, we will not be able to do the TEE until Friday. Were hoping that there may be a cancellation today. Blood cultures are pending. I think that we should get pulmonary involved in helping [place] with this evaluation.    I have spent a total of 40 minutes with patient reviewing hospital  notes , telemetry, EKGs, labs and examining patient as well as establishing an assessment and plan that was discussed with the patient. > 50% of time was spent in direct patient care.    Thayer Headings, Brooke Bonito., MD, [facility] 11/26/2016, 9:22 AM [address],  [place] Pager [phone]

## 2016-11-26 NOTE — Transfer of Care (Signed)
Immediate Anesthesia Transfer of Care Note  Patient: Jeremy Hood  Procedure(s) Performed: Procedure(s): TRANSESOPHAGEAL ECHOCARDIOGRAM (TEE) (N/A)  Patient Location: PACU  Anesthesia Type:MAC  Level of Consciousness: awake, oriented and patient cooperative  Airway & Oxygen Therapy: Patient Spontanous Breathing  Post-op Assessment: Report given to RN, Post -op Vital signs reviewed and stable and Patient moving all extremities  Post vital signs: Reviewed and stable  Last Vitals:  Vitals:   11/26/16 0419 11/26/16 1225  BP: (!) 145/89 (!) 148/85  Pulse: 92 88  Resp: 16 14  Temp:      Last Pain:  Vitals:   11/26/16 1225  TempSrc: Oral  PainSc:       Patients Stated Pain Goal: 2 (44/69/50 7225)  Complications: No apparent anesthesia complications

## 2016-11-26 NOTE — Interval H&P Note (Signed)
History and Physical Interval Note:  11/26/2016 1:04 PM  Jeremy Hood  has presented today for surgery, with the diagnosis of bacteremia  The various methods of treatment have been discussed with the patient and family. After consideration of risks, benefits and other options for treatment, the patient has consented to  Procedure(s): TRANSESOPHAGEAL ECHOCARDIOGRAM (TEE) (N/A) as a surgical intervention .  The patient's history has been reviewed, patient examined, no change in status, stable for surgery.  I have reviewed the patient's chart and labs.  Questions were answered to the patient's satisfaction.     Mertie Moores

## 2016-11-26 NOTE — Progress Notes (Signed)
No acute events overnight patient was made NPO after midnight for planned procedure today (TEE). no consent signed at this time since no provider has discussed risks and benefits of procedure. Patient sleept well with use of cough suppressant PRN Q Hs. Wife at bedside.

## 2016-11-26 NOTE — Progress Notes (Signed)
  Echocardiogram Echocardiogram Transesophageal has been performed.  Jeremy Hood 11/26/2016, 1:31 PM

## 2016-11-26 NOTE — Anesthesia Preprocedure Evaluation (Signed)
Anesthesia Evaluation  Patient identified by MRN, date of birth, ID band Patient awake    Reviewed: Allergy & Precautions, NPO status , Patient's Chart, lab work & pertinent test results  Airway Mallampati: II  TM Distance: >3 FB     Dental  (+) Teeth Intact, Dental Advisory Given   Pulmonary Current Smoker,     + decreased breath sounds      Cardiovascular hypertension,  Rhythm:Regular Rate:Normal     Neuro/Psych    GI/Hepatic GERD  ,(+)     substance abuse  cocaine use and marijuana use,   Endo/Other    Renal/GU      Musculoskeletal   Abdominal (+) + obese,   Peds  Hematology   Anesthesia Other Findings   Reproductive/Obstetrics                             Anesthesia Physical Anesthesia Plan  ASA: III  Anesthesia Plan: MAC   Post-op Pain Management:    Induction: Intravenous  Airway Management Planned: Natural Airway and Nasal Cannula  Additional Equipment:   Intra-op Plan:   Post-operative Plan:   Informed Consent: I have reviewed the patients History and Physical, chart, labs and discussed the procedure including the risks, benefits and alternatives for the proposed anesthesia with the patient or authorized representative who has indicated his/her understanding and acceptance.     Plan Discussed with:   Anesthesia Plan Comments:         Anesthesia Quick Evaluation

## 2016-11-26 NOTE — Progress Notes (Signed)
PROGRESS NOTE    Jeremy Hood  [id number] DOB: [birth year] DOA: 11/23/2016 PCP: No PCP Per Patient   Outpatient Specialists:     Brief Narrative:  Jeremy Hood is a 41 y.o. male  pmh of tobacco abuse and uncontrolled hypertension presents emergency room with chief complaint of chest pain. Patient states the family this is ever happened before. Denies any chest trauma. Denies any recent coughing. Patient states that the pain came out of nowhere it is a pressure. Rates it as severe. Did not radiate. No nausea vomiting diaphoresis associated with. Patient does have some sweating at baseline. Patient has no family history of heart attack and family members less than age 3. Patient states that his chest pain does get worse with movement. No hx of VTE.     Assessment & Plan:   Principal Problem:   Chest pain Active Problems:   Costochondritis   HTN (hypertension)   Pulmonary lesion bilateral with suspected emobli   Substance abuse, coacaine and marijuana    CP-  Stress test Echo: Left ventricle: The cavity size was normal. There was severe   concentric hypertrophy. Systolic function was normal. The   estimated ejection fraction was in the range of 55% to 60%. Wall   motion was normal; there were no regional wall motion   abnormalities. Doppler parameters are consistent with abnormal   left ventricular relaxation (grade 1 diastolic dysfunction).   Doppler parameters are consistent with elevated ventricular   end-diastolic filling pressure. -d dimer was elevated but CTA negative for clot-- did show nodules-- septic emboli or metastasis for TEE either today or Friday- if negative will need CT Scans of chest/abd/pelvis and pulm consult -denies IV drug use  Low potassium Replete -recheck in AM  Costochondritis When necessary Flexeril  Cocaine abuse -encouraged cessation  Tobacco abuse Advised smk cessation   DVT prophylaxis:  SQ Heparin  Code  Status: Full Code   Family Communication: wife  Disposition Plan:     Consultants:   cards   Subjective: Cough worsening  Objective: Vitals:   11/26/16 0100 11/26/16 0400 11/26/16 0419 11/26/16 0420  BP: (!) 144/98  (!) 145/89   Pulse: 84  92   Resp:   16   Temp:  99.7 F (37.6 C)    TempSrc:  Oral    SpO2:      Weight:    119.9 kg (264 lb 5.3 oz)  Height:        Intake/Output Summary (Last 24 hours) at 11/26/16 1200 Last data filed at 11/25/16 1818  Gross per 24 hour  Intake              480 ml  Output                0 ml  Net              480 ml   Filed Weights   11/24/16 0403 11/25/16 0500 11/26/16 0420  Weight: 121.5 kg (267 lb 14.4 oz) 119.3 kg (263 lb 1.6 oz) 119.9 kg (264 lb 5.3 oz)    Examination:  General exam: Appears calm and comfortable  Respiratory system: Clear to auscultation. Respiratory effort normal. Cardiovascular system: S1 & S2 heard, RRR. No JVD, murmurs, rubs, gallops or clicks. No pedal edema. Gastrointestinal system: Abdomen is nondistended, soft and nontender. No organomegaly or masses felt. Normal bowel sounds heard. Central nervous system: Alert and oriented. No focal neurological deficits.      Data  Reviewed: I have personally reviewed following labs and imaging studies  CBC:  Recent Labs Lab 11/23/16 1037 11/26/16 0436  WBC 8.4 13.2*  HGB 11.8* 11.9*  HCT 35.8* 36.6*  MCV 87.1 85.7  PLT 280 [id number]   Basic Metabolic Panel:  Recent Labs Lab 11/23/16 1037 11/24/16 0354 11/26/16 0436  NA 137 137 134*  K 3.1* 3.3* 3.3*  CL 102 102 93*  CO2 23 27 27   GLUCOSE 146* 123* 124*  BUN 16 10 17   CREATININE 1.26* 1.17 1.24  CALCIUM 8.6* 8.9 9.1  MG 2.0  --   --    GFR: Estimated Creatinine Clearance: 105.3 mL/min (by C-G formula based on SCr of 1.24 mg/dL). Liver Function Tests: No results for input(s): AST, ALT, ALKPHOS, BILITOT, PROT, ALBUMIN in the last 168 hours. No results for input(s): LIPASE, AMYLASE in the  last 168 hours. No results for input(s): AMMONIA in the last 168 hours. Coagulation Profile: No results for input(s): INR, PROTIME in the last 168 hours. Cardiac Enzymes:  Recent Labs Lab 11/23/16 1745 11/24/16 0836 11/24/16 1402  TROPONINI 0.05* 0.04* 0.04*   BNP (last 3 results) No results for input(s): PROBNP in the last 8760 hours. HbA1C: No results for input(s): HGBA1C in the last 72 hours. CBG: No results for input(s): GLUCAP in the last 168 hours. Lipid Profile: No results for input(s): CHOL, HDL, LDLCALC, TRIG, CHOLHDL, LDLDIRECT in the last 72 hours. Thyroid Function Tests: No results for input(s): TSH, T4TOTAL, FREET4, T3FREE, THYROIDAB in the last 72 hours. Anemia Panel: No results for input(s): VITAMINB12, FOLATE, FERRITIN, TIBC, IRON, RETICCTPCT in the last 72 hours. Urine analysis: No results found for: COLORURINE, APPEARANCEUR, LABSPEC, PHURINE, GLUCOSEU, HGBUR, BILIRUBINUR, KETONESUR, PROTEINUR, UROBILINOGEN, NITRITE, LEUKOCYTESUR   ) Recent Results (from the past 240 hour(s))  Culture, blood (Routine X 2) w Reflex to ID Panel     Status: None (Preliminary result)   Collection Time: 11/24/16  1:55 PM  Result Value Ref Range Status   Specimen Description BLOOD LEFT HAND  Final   Special Requests BOTTLES DRAWN AEROBIC AND ANAEROBIC 10CC  Final   Culture NO GROWTH 1 DAY  Final   Report Status PENDING  Incomplete  Culture, blood (Routine X 2) w Reflex to ID Panel     Status: None (Preliminary result)   Collection Time: 11/24/16  2:02 PM  Result Value Ref Range Status   Specimen Description BLOOD RIGHT ANTECUBITAL  Final   Special Requests BOTTLES DRAWN AEROBIC AND ANAEROBIC 10CC  Final   Culture NO GROWTH 1 DAY  Final   Report Status PENDING  Incomplete      Anti-infectives    None       Radiology Studies: Nm Myocar Multi W/spect W/wall Motion / Ef  Result Date: 11/24/2016 CLINICAL DATA:  Chest pain, shortness of Breath EXAM: MYOCARDIAL IMAGING WITH  SPECT (REST AND PHARMACOLOGIC-STRESS) GATED LEFT VENTRICULAR WALL MOTION STUDY LEFT VENTRICULAR EJECTION FRACTION TECHNIQUE: Standard myocardial SPECT imaging was performed after resting intravenous injection of 10 mCi Tc-102m tetrofosmin. Subsequently, intravenous infusion of Lexiscan was performed under the supervision of the Cardiology staff. At peak effect of the drug, 30 mCi Tc-37m tetrofosmin was injected intravenously and standard myocardial SPECT imaging was performed. Quantitative gated imaging was also performed to evaluate left ventricular wall motion, and estimate left ventricular ejection fraction. COMPARISON:  None. FINDINGS: Perfusion: There is decreased activity noted anteriorly on the stress images, normal on the rest images, concerning for anterior wall ischemia Wall Motion:  Mild generalized hypokinesia Left Ventricular Ejection Fraction: 43 % End diastolic volume Q000111Q ml End systolic volume 92 ml IMPRESSION: 1. Area of mild reversibility anteriorly concerning for ischemia. 2. Mild generalized hypokinesia. 3. Left ventricular ejection fraction 43% 4. Non invasive risk stratification*: High *2012 Appropriate Use Criteria for Coronary Revascularization Focused Update: J Am Coll Cardiol. N6492421. [email or web address]://[email or web address] Electronically Signed   By: Rolm Baptise M.D.   On: 11/24/2016 16:22   Dg Chest Port 1 View  Result Date: 11/26/2016 CLINICAL DATA:  Patient admitted for chest pain and cough 11/23/2016. Symptoms continue. EXAM: PORTABLE CHEST 1 VIEW COMPARISON:  PA and lateral chest and CT chest 11/23/2016. FINDINGS: There is cardiomegaly without edema. Nodular opacities seen on prior CT scan are not as well demonstrated although two opacities are identified in the right upper lobe. No pneumothorax or pleural effusion. IMPRESSION: Nodular opacities are better demonstrated on prior CT although 2 opacities are seen in the right upper lobe. Cardiomegaly  without edema. Electronically Signed   By: Inge Rise M.D.   On: 11/26/2016 11:24        Scheduled Meds: . aspirin EC  325 mg Oral Daily  . chlorthalidone  50 mg Oral Daily  . heparin  5,000 Units Subcutaneous Q8H  . Influenza vac split quadrivalent PF  0.5 mL Intramuscular Tomorrow-1000  . pneumococcal 23 valent vaccine  0.5 mL Intramuscular Tomorrow-1000  . potassium chloride  40 mEq Oral BID  . regadenoson  0.4 mg Intravenous Once   Continuous Infusions:   LOS: 1 day    Time spent: 25 min    [place], DO [facility] Pager [phone]  If 7PM-7AM, please contact night-coverage [email or web address] Password [email or web address] 11/26/2016, 12:00 PM

## 2016-11-26 NOTE — Anesthesia Postprocedure Evaluation (Addendum)
Anesthesia Post Note  Patient: Jeremy Hood  Procedure(s) Performed: Procedure(s) (LRB): TRANSESOPHAGEAL ECHOCARDIOGRAM (TEE) (N/A)  Patient location during evaluation: Endoscopy Anesthesia Type: MAC Level of consciousness: awake and alert Pain management: pain level controlled Vital Signs Assessment: post-procedure vital signs reviewed and stable Respiratory status: spontaneous breathing, nonlabored ventilation, respiratory function stable and patient connected to nasal cannula oxygen Cardiovascular status: stable and blood pressure returned to baseline Anesthetic complications: no       Last Vitals:  Vitals:   11/26/16 1405 11/26/16 1457  BP: (!) 150/90 131/74  Pulse: 82 88  Resp: (!) 22 18  Temp:  37.3 C    Last Pain:  Vitals:   11/26/16 1457  TempSrc: Oral  PainSc:                  Jesus Nevills,JAMES TERRILL

## 2016-11-26 NOTE — Consult Note (Signed)
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 Non productive cough, + sweats but documented fevers. CT chest 11/24/16 revealed bilateral pulmonary nodules ? metastatic dz vz septic emboli. Note toxicology screen  + for above mentioned drugs. He is for TEE 2/7 per cards and PCCM asked to evaluate. We will follow and make recommendations depending on outcome of TEE.  PAST MEDICAL HISTORY :   has a past medical history of Chest pain (11/2016) and Hypertension.  has a past surgical history that includes Adenoidectomy. Prior to Admission medications   Not on File   No Known Allergies  FAMILY HISTORY:  family history includes Diabetes in his mother; Hypertension in his mother. SOCIAL HISTORY:  reports that he has been smoking Cigarettes.  He has a 10.00 pack-year smoking history. He has never used smokeless tobacco. He reports that he drinks alcohol. He reports that he uses drugs, including Marijuana.  REVIEW OF SYSTEMS:   10 point review of system taken, please see HPI for positives and negatives.   SUBJECTIVE: NAD at rest  VITAL SIGNS: Temp:  [98.9 F (37.2 C)-99.7 F (37.6 C)] 99.7 F (37.6 C) (02/07 0400) Pulse Rate:  [81-92] 92 (02/07 0419) Resp:  [16-18] 16 (02/07 0419) BP: (129-145)/(73-98) 145/89 (02/07 0419) SpO2:  [97 %-100 %] 100 % (02/06 2108) Weight:  [264 lb 5.3 oz (119.9 kg)] 264 lb 5.3 oz (119.9 kg) (02/07 0420)  PHYSICAL EXAMINATION: General:  Obese BM NAD at rest Neuro:  Intact HEENT:  No JVD/LAN, oral mucosa moist Cardiovascular:  HSR RRR Lungs:  Mild  rhonchi Abdomen:  Soft nt Musculoskeletal:  intact Skin:  Warm and moist   Recent Labs Lab 11/23/16 1037 11/24/16 0354 11/26/16 0436  NA 137 137 134*  K 3.1* 3.3* 3.3*  CL 102 102 93*  CO2 23 27 27   BUN 16 10 17   CREATININE 1.26* 1.17 1.24  GLUCOSE 146* 123* 124*    Recent Labs Lab 11/23/16 1037 11/26/16 0436  HGB 11.8* 11.9*  HCT 35.8* 36.6*  WBC 8.4 13.2*  PLT 280 335   Ct Angio Chest Pe W Or Wo Contrast  Result Date: 11/24/2016 CLINICAL DATA:  Pt c/o left sided CP since last night, SOB Hx of HTN EXAM: CT ANGIOGRAPHY CHEST WITH CONTRAST TECHNIQUE: Multidetector CT imaging of the chest was performed using the standard protocol during bolus administration of intravenous contrast. Multiplanar CT image reconstructions and MIPs were obtained to evaluate the vascular anatomy. CONTRAST:  85cc isovue 370 COMPARISON:  None. FINDINGS: Cardiovascular: Left arm IV contrast administration. Innominate vein and SVC patent. Right atrium and right ventricle nondilated. Satisfactory opacification of pulmonary arteries noted, and there is no evidence of pulmonary emboli. Patent bilateral pulmonary veins drain into the left atrium. Adequate contrast opacification of the thoracic aorta with no evidence of dissection, aneurysm, or stenosis. There is bovine variant brachiocephalic arch anatomy without proximal stenosis. Mediastinum/Nodes: No enlarged mediastinal, hilar, or axillary lymph nodes. Thyroid gland, trachea, and esophagus demonstrate no significant findings. Lungs/Pleura: Bilateral pulmonary nodules involving all lobes. The margins of  most of the nodules are somewhat hazy and indistinct. None demonstrate calcification or definite cavitation. Representative lesions include 14 mm in the left apex, image 21/607 ; 12 mm pleural-based nodule, anterior right upper lobe image 43/607. There are patchy airspace opacities posteriorly in both lung bases probably a combination of pulmonary nodules and  adjacent atelectasis/consolidation. Trace bilateral pleural effusions. No pneumothorax. Upper Abdomen: No acute abnormality. Musculoskeletal: Anterior vertebral endplate spurring at multiple levels in the mid and lower thoracic spine. Review of the MIP images confirms the above findings. IMPRESSION: 1. Negative for acute PE or thoracic aortic dissection. 2. Bilateral pulmonary nodules. May represent metastatic disease versus septic emboli. 3. Trace bilateral pleural effusions. Electronically Signed   By: Lucrezia Europe M.D.   On: 11/24/2016 11:45   Nm Myocar Multi W/spect W/wall Motion / Ef  Result Date: 11/24/2016 CLINICAL DATA:  Chest pain, shortness of Breath EXAM: MYOCARDIAL IMAGING WITH SPECT (REST AND PHARMACOLOGIC-STRESS) GATED LEFT VENTRICULAR WALL MOTION STUDY LEFT VENTRICULAR EJECTION FRACTION TECHNIQUE: Standard myocardial SPECT imaging was performed after resting intravenous injection of 10 mCi Tc-84m tetrofosmin. Subsequently, intravenous infusion of Lexiscan was performed under the supervision of the Cardiology staff. At peak effect of the drug, 30 mCi Tc-13m tetrofosmin was injected intravenously and standard myocardial SPECT imaging was performed. Quantitative gated imaging was also performed to evaluate left ventricular wall motion, and estimate left ventricular ejection fraction. COMPARISON:  None. FINDINGS: Perfusion: There is decreased activity noted anteriorly on the stress images, normal on the rest images, concerning for anterior wall ischemia Wall Motion: Mild generalized hypokinesia Left Ventricular Ejection Fraction: 43 % End diastolic volume [id number] ml End systolic volume 92 ml IMPRESSION: 1. Area of mild reversibility anteriorly concerning for ischemia. 2. Mild generalized hypokinesia. 3. Left ventricular ejection fraction 43% 4. Non invasive risk stratification*: High *2012 Appropriate Use Criteria for Coronary Revascularization Focused Update: J Am Coll Cardiol. [id number].  http://content.airportbarriers.com.aspx?articleid=[email or web address] Electronically Signed   By: Rolm Baptise M.D.   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 Non productive cough, + sweats but documented fevers. CT chest 11/24/16 revealed bilateral pulmonary nodules ? metastatic dz vz septic emboli. Note toxicology screen  + for above mentioned drugs. He is for TEE 2/7 per cards and PCCM asked to evaluate. We will follow and make recommendations depending on outcome of TEE.    PLAN Await results of  TEE before further evaluation pf bilateral pulmonary nodules. May need FOB in future. Consider LEDS. Check procal for completeness. Check HIV. ? Need for abx, wbc 13.2. Stop smoking  Stop doing drugs  Check HIV for completeness  Check LEDS for completeness    [facility] Minor ACNP Maryanna Shape PCCM Pager [phone] till 3 pm If no answer page [phone]  Attending Note:  41 year old male with drug abuse history presenting with diffuse pulmonary nodules in both lungs.  History would be more consistent with septic emboli rather than metastatic cancer.  On exam, no SOB but does c/o CP with clear lungs.  I reviewed CT myself, diffuse opacification noted, suspect more likely septic emboli but can not r/o metastatic cancer.  Discussed with [facility]-MD and PCCM-NP  Pulmonary  nodules:  Ddx of septic emboli vs mets vs hypersensitivity reaction to cocaine (much more rare)  - TEE  - No bronch  - Pan culture  - See below  Tobacco abuse  - Smoking cessation.  Drug abuse  - Consider rehab upon discharge  Suspect septic emboli:  - Recommend checking a PCT and if positive to start abx  immediately  PCCM will follow  Patient seen and examined, agree with above note.  I dictated the care and orders written for this patient under my direction.  Rush Farmer, MD [phone]  11/26/2016, 11:03 AM

## 2016-11-27 ENCOUNTER — Inpatient Hospital Stay (HOSPITAL_COMMUNITY): Payer: Self-pay

## 2016-11-27 ENCOUNTER — Telehealth: Payer: Self-pay

## 2016-11-27 DIAGNOSIS — R05 Cough: Secondary | ICD-10-CM

## 2016-11-27 DIAGNOSIS — Z720 Tobacco use: Secondary | ICD-10-CM

## 2016-11-27 DIAGNOSIS — F1410 Cocaine abuse, uncomplicated: Secondary | ICD-10-CM

## 2016-11-27 DIAGNOSIS — R918 Other nonspecific abnormal finding of lung field: Secondary | ICD-10-CM

## 2016-11-27 DIAGNOSIS — M79609 Pain in unspecified limb: Secondary | ICD-10-CM

## 2016-11-27 LAB — SEDIMENTATION RATE: Sed Rate: 57 mm/hr — ABNORMAL HIGH (ref 0–16)

## 2016-11-27 LAB — HIV ANTIBODY (ROUTINE TESTING W REFLEX): HIV Screen 4th Generation wRfx: NONREACTIVE

## 2016-11-27 LAB — C-REACTIVE PROTEIN: CRP: 29 mg/dL — AB (ref <1.0)

## 2016-11-27 MED ORDER — HYDROCOD POLST-CPM POLST ER 10-8 MG/5ML PO SUER
5.0000 mL | Freq: Every evening | ORAL | Status: DC | PRN
  Administered 2016-11-30: 5 mL via ORAL
  Filled 2016-11-27: qty 5

## 2016-11-27 MED ORDER — IOPAMIDOL (ISOVUE-300) INJECTION 61%
INTRAVENOUS | Status: AC
  Administered 2016-11-27: 19:00:00
  Filled 2016-11-27: qty 100

## 2016-11-27 NOTE — Progress Notes (Signed)
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 Non productive cough, + sweats but documented fevers. CT chest 11/24/16 revealed bilateral pulmonary nodules ? metastatic dz vz septic emboli. Note toxicology screen  + for above mentioned drugs. He is for TEE 2/7 per cards and PCCM asked to evaluate. We will follow and make recommendations depending on outcome of TEE.  SIGNIFICANT EVENTS    STUDIES:  CTA chest 2/5>>> 1. Negative for acute PE or thoracic aortic dissection. 2. Bilateral pulmonary nodules. May represent metastatic disease versus septic emboli. 3. Trace bilateral pleural effusions.  Pertinent Labs:  TEE 2/7>> no evidence endocarditis  HIV 2/7>> NR Pct 2/7>> 0.11  SUBJECTIVE:  Denies chest pain, SOB at rest.  Some mild DOE.   Per pt -- no known family hx autoimmune disease. Mother and sister had breast cancer but no known family hx lung, pancreatic, colon cancers.   He does smoke marijuana and snorts cocaine occasionally, does not smoke crack cocaine. Does NOT inject any drugs and has never.   VITAL SIGNS: Temp:  [98.5 F (36.9 C)-100.4 F (38 C)] 99.9 F (37.7 C) (02/08 0504) Pulse Rate:  [82-94] 89 (02/08 0504) Resp:  [10-33] 23 (02/08 0504) BP: (115-154)/(66-92) 140/80 (02/08 0504) SpO2:  [90 %-99 %] 90 % (02/08 0504) Weight:  [118.8 kg (262 lb)] 118.8 kg (262 lb) (02/08 0504)  PHYSICAL EXAMINATION: General:  Pleasant male, NAD in bed  HEENT: MM pink/moist Neuro: awake, alert, appropriate, MAE  CV: s1s2 rrr, no m/r/g PULM: even/non-labored, lungs bilaterally clear  QM:VHQI, non-tender, bsx4 active  Extremities: warm/dry, no edema  Skin:  no rashes or lesions  Recent Labs Lab 11/23/16 1037 11/24/16 0354 11/26/16 0436  NA 137 137 134*  K 3.1* 3.3* 3.3*  CL 102 102 93*  CO2 '23 27 27  '$ BUN '16 10 17  '$ CREATININE 1.26* 1.17 1.24  GLUCOSE 146* 123* 124*   Recent Labs Lab 11/23/16 1037 11/26/16 0436  HGB 11.8* 11.9*  HCT 35.8* 36.6*  WBC 8.4 13.2*  PLT 280 335   Dg Chest Port 1 View  Result Date: 11/26/2016 CLINICAL DATA:  Patient admitted for chest pain and cough 11/23/2016. Symptoms continue. EXAM: PORTABLE CHEST 1 VIEW COMPARISON:  PA and lateral chest and CT chest 11/23/2016. FINDINGS: There is cardiomegaly without edema. Nodular opacities seen on prior CT scan are not as well demonstrated although two opacities are identified in the right upper lobe. No pneumothorax or pleural effusion. IMPRESSION: Nodular opacities are better demonstrated on prior CT although 2 opacities are seen in the right upper lobe. Cardiomegaly without edema. Electronically Signed   By: Inge Rise M.D.   On: 11/26/2016 11:24   ASSESSMENT   Principal Problem:   Chest pain   HTN (hypertension)   Pulmonary lesion bilateral with suspected emobli   Substance abuse, coacaine and marijuana   Discussion: 41yo male smoker with history of drug abuse presenting with musculoskeletal chest pain and cough.  CT chest revealed bilateral pulmonary nodules thought to be metastatic disease v septic emboli.  TEE negative for endocarditis, HIV neg, pct essentially negative.    PLAN Check CT abd/pelvis  If no other  lesions more amenable to bx would ask IR to bx pleural based pulmonary nodules  Smoking cessation  Drug counseling  NO role FOB   Follow cultures although low suspicion for infectious etiology given neg pct   Jeremy Madrid, NP 11/27/2016  10:03 AM Pager: [phone] or [phone]  Attending Note:  41 year old male with drug abuse history presenting with diffuse pulmonary nodules in both lungs.  History would be more  consistent with septic emboli rather than metastatic cancer.  On exam, no SOB but does c/o CP with clear lungs.  I reviewed CT myself, diffuse opacification noted, suspect more likely septic emboli but can not r/o metastatic cancer.  Discussed with [facility]-MD and PCCM-NP  Pulmonary nodules:  Ddx of autoimmune vs mets vs hypersensitivity reaction to cocaine (much more rare)             - TEE negative             - No bronch until auto-immune panel is back             - Pan culture negative to date  - Spoke with patient at length, may need a bronchoscopy after auto-immune panel results  - Recommend abdominal/pelvic CT  Autoimmune w/u  - ACE  - RF  - ANA  - ESR  - CRP  - Anti-GBM  - c-ANCA  - p-ANCA  - Quantiferon gold  Tobacco abuse:  - Smoking cessation  Cocaine abuse:  - Drug rehab  PCCM will follow  Patient seen and examined, agree with above note.  I dictated the care and orders written for this patient under my direction.  Rush Farmer, MD [phone]

## 2016-11-27 NOTE — Telephone Encounter (Signed)
This Case Manager received call from [name], RN CM who indicated patient's HFU appointment needs to be changed. Appointment changed to 12/01/16 at 1000 with Zettie Pho, [place] at [facility]. [name], RN CM updated.

## 2016-11-27 NOTE — Progress Notes (Signed)
**  Preliminary report by tech**  Bilateral lower extremity venous duplex completed. There is no evidence of deep or superficial vein thrombosis involving the right and left lower extremities. All visualized vessels appear patent and compressible. There is no evidence of Baker's cysts bilaterally.  11/27/16 12:15 PM Carlos Levering RVT

## 2016-11-27 NOTE — Progress Notes (Signed)
PROGRESS NOTE    Jeremy Hood  [id number] DOB: 1976/05/19 DOA: 11/23/2016 PCP: No PCP Per Patient   Outpatient Specialists:     Brief Narrative:  Jeremy Hood is a 41 y.o. male  pmh of tobacco abuse and uncontrolled hypertension presents emergency room with chief complaint of chest pain. Patient states the family this is ever happened before. Denies any chest trauma. Denies any recent coughing. Patient states that the pain came out of nowhere it is a pressure. Rates it as severe. Did not radiate. No nausea vomiting diaphoresis associated with. Patient does have some sweating at baseline. Patient has no family history of heart attack and family members less than age 23. Patient states that his chest pain does get worse with movement. No hx of VTE.     Assessment & Plan:   Principal Problem:   Chest pain Active Problems:   Costochondritis   HTN (hypertension)   Pulmonary lesion bilateral with suspected emobli   Substance abuse, coacaine and marijuana    Cough   Septic embolism (HCC)   CP-  Stress test Echo: Left ventricle: The cavity size was normal. There was severe   concentric hypertrophy. Systolic function was normal. The   estimated ejection fraction was in the range of 55% to 60%. Wall   motion was normal; there were no regional wall motion   abnormalities. Doppler parameters are consistent with abnormal   left ventricular relaxation (grade 1 diastolic dysfunction).   Doppler parameters are consistent with elevated ventricular   end-diastolic filling pressure. -d dimer was elevated but CTA negative for clot-- did show nodules-- TEE NEgative-denies IV drug use -CT Abd /pelvis pending -pulm consult appreciated May need IR consult if CT scan does not show something amenable to biopsy  Low potassium Replete -recheck in AM  Costochondritis When necessary Flexeril  Cocaine abuse -encouraged cessation  Tobacco abuse Advised smk cessation   DVT  prophylaxis:  SQ Heparin  Code Status: Full Code   Family Communication: wife  Disposition Plan:     Consultants:   cards   Subjective: Pain with coughing-- worse at night  Objective: Vitals:   11/26/16 2122 11/27/16 0504 11/27/16 1017 11/27/16 1104  BP: 124/74 140/80  113/60  Pulse:  89    Resp: (!) 25 (!) 23    Temp: 99.9 F (37.7 C) 99.9 F (37.7 C) 98.6 F (37 C)   TempSrc: Oral Oral    SpO2: 99% 90%    Weight:  118.8 kg (262 lb)    Height:        Intake/Output Summary (Last 24 hours) at 11/27/16 1153 Last data filed at 11/26/16 1800  Gross per 24 hour  Intake              640 ml  Output                0 ml  Net              640 ml   Filed Weights   11/25/16 0500 11/26/16 0420 11/27/16 0504  Weight: 119.3 kg (263 lb 1.6 oz) 119.9 kg (264 lb 5.3 oz) 118.8 kg (262 lb)    Examination:  General exam: Appears calm and comfortable  Respiratory system: Clear to auscultation. Respiratory effort normal. Cardiovascular system: S1 & S2 heard, RRR. No JVD, murmurs, rubs, gallops or clicks. No pedal edema. Gastrointestinal system: Abdomen is nondistended, soft and nontender. No organomegaly or masses felt. Normal bowel sounds heard. Central  nervous system: Alert and oriented. No focal neurological deficits.      Data Reviewed: I have personally reviewed following labs and imaging studies  CBC:  Recent Labs Lab 11/23/16 1037 11/26/16 0436  WBC 8.4 13.2*  HGB 11.8* 11.9*  HCT 35.8* 36.6*  MCV 87.1 85.7  PLT 280 [id number]   Basic Metabolic Panel:  Recent Labs Lab 11/23/16 1037 11/24/16 0354 11/26/16 0436  NA 137 137 134*  K 3.1* 3.3* 3.3*  CL 102 102 93*  CO2 23 27 27   GLUCOSE 146* 123* 124*  BUN 16 10 17   CREATININE 1.26* 1.17 1.24  CALCIUM 8.6* 8.9 9.1  MG 2.0  --   --    GFR: Estimated Creatinine Clearance: 104.9 mL/min (by C-G formula based on SCr of 1.24 mg/dL). Liver Function Tests: No results for input(s): AST, ALT, ALKPHOS, BILITOT,  PROT, ALBUMIN in the last 168 hours. No results for input(s): LIPASE, AMYLASE in the last 168 hours. No results for input(s): AMMONIA in the last 168 hours. Coagulation Profile: No results for input(s): INR, PROTIME in the last 168 hours. Cardiac Enzymes:  Recent Labs Lab 11/23/16 1745 11/24/16 0836 11/24/16 1402  TROPONINI 0.05* 0.04* 0.04*   BNP (last 3 results) No results for input(s): PROBNP in the last 8760 hours. HbA1C: No results for input(s): HGBA1C in the last 72 hours. CBG: No results for input(s): GLUCAP in the last 168 hours. Lipid Profile: No results for input(s): CHOL, HDL, LDLCALC, TRIG, CHOLHDL, LDLDIRECT in the last 72 hours. Thyroid Function Tests: No results for input(s): TSH, T4TOTAL, FREET4, T3FREE, THYROIDAB in the last 72 hours. Anemia Panel: No results for input(s): VITAMINB12, FOLATE, FERRITIN, TIBC, IRON, RETICCTPCT in the last 72 hours. Urine analysis: No results found for: COLORURINE, APPEARANCEUR, LABSPEC, PHURINE, GLUCOSEU, HGBUR, BILIRUBINUR, KETONESUR, PROTEINUR, UROBILINOGEN, NITRITE, LEUKOCYTESUR   ) Recent Results (from the past 240 hour(s))  Culture, blood (Routine X 2) w Reflex to ID Panel     Status: None (Preliminary result)   Collection Time: 11/24/16  1:55 PM  Result Value Ref Range Status   Specimen Description BLOOD LEFT HAND  Final   Special Requests BOTTLES DRAWN AEROBIC AND ANAEROBIC 10CC  Final   Culture NO GROWTH 2 DAYS  Final   Report Status PENDING  Incomplete  Culture, blood (Routine X 2) w Reflex to ID Panel     Status: None (Preliminary result)   Collection Time: 11/24/16  2:02 PM  Result Value Ref Range Status   Specimen Description BLOOD RIGHT ANTECUBITAL  Final   Special Requests BOTTLES DRAWN AEROBIC AND ANAEROBIC 10CC  Final   Culture NO GROWTH 2 DAYS  Final   Report Status PENDING  Incomplete      Anti-infectives    None       Radiology Studies: Dg Chest Port 1 View  Result Date: 11/26/2016 CLINICAL  DATA:  Patient admitted for chest pain and cough 11/23/2016. Symptoms continue. EXAM: PORTABLE CHEST 1 VIEW COMPARISON:  PA and lateral chest and CT chest 11/23/2016. FINDINGS: There is cardiomegaly without edema. Nodular opacities seen on prior CT scan are not as well demonstrated although two opacities are identified in the right upper lobe. No pneumothorax or pleural effusion. IMPRESSION: Nodular opacities are better demonstrated on prior CT although 2 opacities are seen in the right upper lobe. Cardiomegaly without edema. Electronically Signed   By: Inge Rise M.D.   On: 11/26/2016 11:24        Scheduled Meds: . aspirin EC  325 mg Oral Daily  . chlorthalidone  50 mg Oral Daily  . heparin  5,000 Units Subcutaneous Q8H  . pneumococcal 23 valent vaccine  0.5 mL Intramuscular Tomorrow-1000  . potassium chloride  40 mEq Oral BID  . regadenoson  0.4 mg Intravenous Once   Continuous Infusions:   LOS: 2 days    Time spent: 25 min    [place], DO [facility] Pager [phone]  If 7PM-7AM, please contact night-coverage [email or web address] Password [facility] 11/27/2016, 11:53 AM

## 2016-11-27 NOTE — Progress Notes (Signed)
Progress Note  Patient Name: Jeremy Hood Date of Encounter: 11/27/2016  Primary Cardiologist: Jamier Urbas  Subjective   TEE yesterday showed no evidence of endocarditis Pulmonary medicine has seen for further eval    Inpatient Medications    Scheduled Meds: . aspirin EC  325 mg Oral Daily  . chlorthalidone  50 mg Oral Daily  . heparin  5,000 Units Subcutaneous Q8H  . Influenza vac split quadrivalent PF  0.5 mL Intramuscular Tomorrow-1000  . pneumococcal 23 valent vaccine  0.5 mL Intramuscular Tomorrow-1000  . potassium chloride  40 mEq Oral BID  . regadenoson  0.4 mg Intravenous Once   Continuous Infusions:  PRN Meds: acetaminophen, alum & mag hydroxide-simeth, cyclobenzaprine, gi cocktail, guaiFENesin-dextromethorphan, hydrALAZINE, lip balm, morphine injection, MUSCLE RUB, nitroGLYCERIN, ondansetron (ZOFRAN) IV, phenol, polyvinyl alcohol, sodium chloride, traMADol, zolpidem   Vital Signs    Vitals:   11/26/16 1457 11/26/16 1820 11/26/16 2122 11/27/16 0504  BP: 131/74  124/74 140/80  Pulse: 88   89  Resp: 18  (!) 25 (!) 23  Temp: 99.1 F (37.3 C) (!) 100.4 F (38 C) 99.9 F (37.7 C) 99.9 F (37.7 C)  TempSrc: Oral Oral Oral Oral  SpO2: 96%  99% 90%  Weight:    262 lb (118.8 kg)  Height:        Intake/Output Summary (Last 24 hours) at 11/27/16 1012 Last data filed at 11/26/16 1800  Gross per 24 hour  Intake              640 ml  Output                0 ml  Net              640 ml   Filed Weights   11/25/16 0500 11/26/16 0420 11/27/16 0504  Weight: 263 lb 1.6 oz (119.3 kg) 264 lb 5.3 oz (119.9 kg) 262 lb (118.8 kg)    Telemetry    NSR  80's-100 with occ bigeminy - Personally Reviewed  ECG     No new tracings  Physical Exam   GEN: No acute distress.   Neck: No JVD Cardiac: RRR, no murmurs, rubs, or gallops.  Respiratory: Clear to auscultation bilaterally. GI: Soft, nontender, non-distended  MS: No edema; No deformity. Neuro:  Nonfocal  Psych:  Normal affect   Labs    Chemistry  Recent Labs Lab 11/23/16 1037 11/24/16 0354 11/26/16 0436  NA 137 137 134*  K 3.1* 3.3* 3.3*  CL 102 102 93*  CO2 23 27 27   GLUCOSE 146* 123* 124*  BUN 16 10 17   CREATININE 1.26* 1.17 1.24  CALCIUM 8.6* 8.9 9.1  GFRNONAA >60 >60 >60  GFRAA >60 >60 >60  ANIONGAP 12 8 14      Hematology  Recent Labs Lab 11/23/16 1037 11/26/16 0436  WBC 8.4 13.2*  RBC 4.11* 4.27  HGB 11.8* 11.9*  HCT 35.8* 36.6*  MCV 87.1 85.7  MCH 28.7 27.9  MCHC 33.0 32.5  RDW 14.4 14.4  PLT 280 335    Cardiac Enzymes  Recent Labs Lab 11/23/16 1745 11/24/16 0836 11/24/16 1402  TROPONINI 0.05* 0.04* 0.04*     Recent Labs Lab 11/23/16 1114 11/23/16 1544  TROPIPOC 0.04 0.03     BNPNo results for input(s): BNP, PROBNP in the last 168 hours.   DDimer   Recent Labs Lab 11/24/16 [phone]  DDIMER 1.13*     Radiology    Dg Chest [facility]  Result Date: 11/26/2016 CLINICAL DATA:  Patient admitted for chest pain and cough 11/23/2016. Symptoms continue. EXAM: PORTABLE CHEST 1 VIEW COMPARISON:  PA and lateral chest and CT chest 11/23/2016. FINDINGS: There is cardiomegaly without edema. Nodular opacities seen on prior CT scan are not as well demonstrated although two opacities are identified in the right upper lobe. No pneumothorax or pleural effusion. IMPRESSION: Nodular opacities are better demonstrated on prior CT although 2 opacities are seen in the right upper lobe. Cardiomegaly without edema. Electronically Signed   By: Inge Rise M.D.   On: 11/26/2016 11:24    Cardiac Studies   1. Lexiscan myoview:    My intrepretation is that the mild anterior attenuation is chest wall artifact.   He has a large chest .  Symptoms are not c/w ischemia now that I have talked with him futher .    Will put this on the back burner for now   2. CT angio:   Has multiple lung lesions.  ? Septic emboli.  Denies IVDA.    3. Echo 11/24/2016 Study Conclusions -  Left ventricle: The cavity size was normal. There was severe   concentric hypertrophy. Systolic function was normal. The   estimated ejection fraction was in the range of 55% to 60%. Wall   motion was normal; there were no regional wall motion   abnormalities. Doppler parameters are consistent with abnormal   left ventricular relaxation (grade 1 diastolic dysfunction).   Doppler parameters are consistent with elevated ventricular   end-diastolic filling pressure. - Aortic valve: Trileaflet; normal thickness leaflets. There was no   regurgitation. - Mitral valve: Structurally normal valve. There was no   regurgitation. - Tricuspid valve: There was trivial regurgitation. - Pulmonic valve: There was trivial regurgitation. - Pulmonary arteries: Systolic pressure was within the normal   range. - Inferior vena cava: The vessel was normal in size. - Pericardium, extracardiac: There was no pericardial effusion.  Patient Profile     41 y.o. male , with past medical history significant for hypertension, untreated as pt has no PCP or insurance, who present to the [facility] ED for evaluation of chest pain, fever, sweats.  Assessment & Plan    1   Chest pain   . Lexiscan myoview was read as having mild ant. Ischemia :  intrepretation by Dr. Acie Fredrickson is that the mild anterior attenuation is chest wall artifact.  He has a large chest .  Symptoms are not c/w ischemia. Will put this on the back burner for now,  We can readdress this at a later date but for now  2. Lung lesions. CT angio:   Has multiple lung lesions.  ? Septic emboli.  Denies IVDA. TEE was negative for endocarditis PCCM onboard.    No new cardiac recs.   Will sign off.  Call for questions     Mertie Moores, MD  11/27/2016 10:18 AM    [address],  [place], [place]  [address] Pager [phone] Phone: [phone]; Fax: [phone]

## 2016-11-28 ENCOUNTER — Inpatient Hospital Stay (HOSPITAL_COMMUNITY): Payer: Self-pay

## 2016-11-28 ENCOUNTER — Inpatient Hospital Stay: Payer: Self-pay | Admitting: Physician Assistant

## 2016-11-28 DIAGNOSIS — K769 Liver disease, unspecified: Secondary | ICD-10-CM

## 2016-11-28 LAB — ANTINUCLEAR ANTIBODIES, IFA: ANTINUCLEAR ANTIBODIES, IFA: NEGATIVE

## 2016-11-28 LAB — ANCA TITERS
Atypical P-ANCA titer: <1:20 {titer}
C-ANCA: <1:20 {titer}
P-ANCA: <1:20 {titer}

## 2016-11-28 LAB — CBC
HCT: 34 % — ABNORMAL LOW (ref 39.0–52.0)
HEMOGLOBIN: 11.4 g/dL — AB (ref 13.0–17.0)
MCH: 28.4 pg (ref 26.0–34.0)
MCHC: 33.5 g/dL (ref 30.0–36.0)
MCV: 84.8 fL (ref 78.0–100.0)
Platelets: 393 10*3/uL (ref 150–400)
RBC: 4.01 MIL/uL — ABNORMAL LOW (ref 4.22–5.81)
RDW: 14.2 % (ref 11.5–15.5)
WBC: 15 10*3/uL — ABNORMAL HIGH (ref 4.0–10.5)

## 2016-11-28 LAB — BASIC METABOLIC PANEL
Anion gap: 12 (ref 5–15)
BUN: 22 mg/dL — ABNORMAL HIGH (ref 6–20)
CALCIUM: 9.1 mg/dL (ref 8.9–10.3)
CHLORIDE: 93 mmol/L — AB (ref 101–111)
CO2: 26 mmol/L (ref 22–32)
CREATININE: 1.5 mg/dL — AB (ref 0.61–1.24)
GFR calc Af Amer: >60 mL/min (ref >60)
GFR calc non Af Amer: 57 mL/min — ABNORMAL LOW (ref >60)
Glucose, Bld: 128 mg/dL — ABNORMAL HIGH (ref 65–99)
Potassium: 3.9 mmol/L (ref 3.5–5.1)
SODIUM: 131 mmol/L — AB (ref 135–145)

## 2016-11-28 LAB — ANGIOTENSIN CONVERTING ENZYME: ANGIOTENSIN-CONVERTING ENZYME: 29 U/L (ref 14–82)

## 2016-11-28 LAB — MPO/PR-3 (ANCA) ANTIBODIES: Myeloperoxidase Abs: <9 U/mL (ref 0.0–9.0)

## 2016-11-28 LAB — GLOMERULAR BASEMENT MEMBRANE ANTIBODIES: GBM Ab: 4 units (ref 0–20)

## 2016-11-28 LAB — RHEUMATOID FACTOR: Rheumatoid fact SerPl-aCnc: 19 IU/mL — ABNORMAL HIGH (ref 0.0–13.9)

## 2016-11-28 LAB — PROCALCITONIN: Procalcitonin: 0.17 ng/mL

## 2016-11-28 MED ORDER — SODIUM CHLORIDE 0.9 % IV SOLN
INTRAVENOUS | Status: DC
  Administered 2016-11-28: 15:00:00 via INTRAVENOUS

## 2016-11-28 MED ORDER — GADOBENATE DIMEGLUMINE 529 MG/ML IV SOLN: 20.0000 mL | Freq: Once | INTRAVENOUS | Status: DC | PRN

## 2016-11-28 NOTE — Progress Notes (Signed)
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 Non productive cough, + sweats but documented fevers. CT chest 11/24/16 revealed bilateral pulmonary nodules ? metastatic dz vz septic emboli. Note toxicology screen  + for above mentioned drugs. He is for TEE 2/7 per cards and PCCM asked to evaluate. We will follow and make recommendations depending on outcome of TEE.  SIGNIFICANT EVENTS    STUDIES:  CTA chest 2/5>>> 1. Negative for acute PE or thoracic aortic dissection. 2. Bilateral pulmonary nodules. May represent metastatic disease versus septic emboli. 3. Trace bilateral pleural effusions.  Pertinent Labs:  TEE 2/7>> no evidence endocarditis  HIV 2/7>> NR Pct 2/7>> 0.11  SUBJECTIVE:  Denies chest pain, SOB at rest.  Some mild DOE.   Per pt -- no known family hx autoimmune disease. Mother and sister had breast cancer but no known family hx lung, pancreatic, colon cancers.   He does smoke marijuana and snorts cocaine occasionally, does not smoke crack cocaine. Does NOT inject any drugs and has never.   VITAL SIGNS: Temp:  [98.7 F (37.1 C)-101.1 F (38.4 C)] 98.7 F (37.1 C) (02/09 1506) Pulse Rate:  [88-106] 88 (02/09 1506) Resp:  [16-17] 17 (02/09 0500) BP: (110-140)/(51-73) 110/51 (02/09 1506) SpO2:  [94 %-97 %] 96 % (02/09 1506) Weight:  [117.9 kg (259 lb 14.4 oz)] 117.9 kg (259 lb 14.4 oz) (02/09 0500)  PHYSICAL EXAMINATION: General:  Pleasant male, NAD in bed  HEENT: /AT, PERRL, EOM-I and MMM Neuro: Awake and interactive, moving all ext to command  CV: RRR, Nl S1/S2, -M/R/G PULM: even/non-labored, lungs bilaterally clear  GE:ZMOQ, non-tender,  bsx4 active  Extremities: warm/dry, no edema  Skin: no rashes or lesions  Recent Labs Lab 11/24/16 0354 11/26/16 0436 11/28/16 0429  NA 137 134* 131*  K 3.3* 3.3* 3.9  CL 102 93* 93*  CO2 '27 27 26  '$ BUN 10 17 22*  CREATININE 1.17 1.24 1.50*  GLUCOSE 123* 124* 128*    Recent Labs Lab 11/23/16 1037 11/26/16 0436 11/28/16 0429  HGB 11.8* 11.9* 11.4*  HCT 35.8* 36.6* 34.0*  WBC 8.4 13.2* 15.0*  PLT 280 335 393   Ct Abdomen Pelvis W Contrast  Result Date: 11/27/2016 CLINICAL DATA:  Patient with multiple pulmonary nodules. Metastatic disease not excluded. EXAM: CT ABDOMEN AND PELVIS WITH CONTRAST TECHNIQUE: Multidetector CT imaging of the abdomen and pelvis was performed using the standard protocol following bolus administration of intravenous contrast. CONTRAST:  100 ISOVUE-300 IOPAMIDOL (ISOVUE-300) INJECTION 61% COMPARISON:  CT chest 11/24/2016. FINDINGS: Lower chest: Normal heart size. Multiple bilateral lower lobe pulmonary nodules are demonstrated measuring up to 3.2 cm within the subpleural left lower lobe. No pleural effusion. Hepatobiliary: Liver is normal in size and contour. Within the central aspect of the liver there is a 4.2 x 4.6 cm (image 22; series 2) low-attenuation lesion. Additionally within the lateral left hepatic lobe there is a 1.7 cm low-attenuation lesion. Within the peripheral right hepatic lobe there is a 1.0 cm low-attenuation lesion (image 18; series 2). Gallbladder is unremarkable. No intrahepatic or extrahepatic biliary ductal dilatation. Pancreas: Unremarkable Spleen: Unremarkable Adrenals/Urinary Tract: The adrenal glands are normal. Kidneys enhance  symmetrically with contrast. No hydronephrosis. Too small to characterize low-attenuation lesion interpolar region left kidney. Urinary bladder is unremarkable. Stomach/Bowel: Oral contrast material to the level of the rectum. Normal appendix. No abnormal bowel wall thickening or evidence for bowel obstruction.  Normal morphology of the stomach. Vascular/Lymphatic: Peripheral calcified atherosclerotic plaque. No retroperitoneal lymphadenopathy. Reproductive: Prostate unremarkable. Other: None. Musculoskeletal: Lumbar spine degenerative changes. No aggressive or acute appearing osseous lesions. IMPRESSION: Multiple nodules are demonstrated within the lower lobes bilaterally, many are subpleural in location. Findings raise the possibility of septic emboli. Metastatic disease is not excluded. Multiple low-attenuation lesions within the liver. The largest measures 4.5 cm. These are indeterminate in etiology and need definitive characterization with pre and post contrast-enhanced MRI in the non-acute setting. Electronically Signed   By: Lovey Newcomer M.D.   On: 11/27/2016 21:48   ASSESSMENT   Principal Problem:   Chest pain   HTN (hypertension)   Pulmonary lesion bilateral with suspected emobli   Substance abuse, coacaine and marijuana   Discussion: 41yo male smoker with history of drug abuse presenting with musculoskeletal chest pain and cough.  CT chest revealed bilateral pulmonary nodules thought to be metastatic disease v septic emboli.  TEE negative for endocarditis, HIV neg, pct essentially negative.   PLAN  41 year old male with drug abuse history presenting with diffuse pulmonary nodules in both lungs.  History would be more consistent with autoimmune disorder.  On exam, no SOB but does c/o CP with clear lungs.  I reviewed CT myself, diffuse opacification noted, suspect sarcoid or other metastatic cancer.  Discussed with [facility]-MD and PCCM-NP  Pulmonary nodules:  Ddx of autoimmune vs mets vs hypersensitivity reaction to cocaine (much more rare)  CRP very elevated.             - TEE negative             - No bronch until auto-immune panel is back             - Pan culture negative to date  - Spoke with patient at length, may need a bronchoscopy after auto-immune panel results  - Abdominal/pelvic CT with  a liver opacity  Liver opacity:  - MRI liver protocol ordered, pending results may consider biopsy  Autoimmune w/u  - ACE  - RF  - ANA  - ESR  - CRP 29.9  - Anti-GBM  - c-ANCA  - p-ANCA  - Quantiferon gold  Tobacco abuse:  - Smoking cessation  Cocaine abuse:  - Drug rehab  PCCM will follow  Rush Farmer, M.D. [facility] Pulmonary/Critical Care Medicine. Pager: [phone]. After hours pager: [phone].

## 2016-11-28 NOTE — Progress Notes (Signed)
PROGRESS NOTE    Jeremy Hood  [id number] DOB: October 28, 1975 DOA: 11/23/2016 PCP: No PCP Per Patient   Outpatient Specialists:     Brief Narrative:  Jeremy Hood is a 41 y.o. male  pmh of tobacco abuse and uncontrolled hypertension presents emergency room with chief complaint of chest pain. Patient states the family this is ever happened before. Denies any chest trauma. Denies any recent coughing. Patient states that the pain came out of nowhere it is a pressure. Rates it as severe. Did not radiate. No nausea vomiting diaphoresis associated with. Patient does have some sweating at baseline. Patient has no family history of heart attack and family members less than age 38. Patient states that his chest pain does get worse with movement. No hx of VTE.     Assessment & Plan:   Principal Problem:   Chest pain Active Problems:   Costochondritis   HTN (hypertension)   Lung mass   Substance abuse, coacaine and marijuana    Cough   Septic embolism (HCC)   Tobacco abuse   Cocaine abuse  Lung nodules/liver nodules- mets vs septic emboli -TEE negative -autoimmune work up in progress -pulm consult appreciated -CT abd/ pelvis- Multiple low-attenuation lesions within the liver. The largest measures 4.5 cm. These are indeterminate in etiology and need definitive characterization with pre and post contrast-enhanced MRI in the non-acute setting. -blood cultures negative  CP- suspect pleuretic Stress test done Echo: Left ventricle: The cavity size was normal. There was severe   concentric hypertrophy. Systolic function was normal. The   estimated ejection fraction was in the range of 55% to 60%. Wall   motion was normal; there were no regional wall motion   abnormalities. Doppler parameters are consistent with abnormal   left ventricular relaxation (grade 1 diastolic dysfunction).   Doppler parameters are consistent with elevated ventricular   end-diastolic filling  pressure. -d dimer was elevated but CTA negative for clot-- did show nodules-- TEE NEgative-denies IV drug use  Low potassium Replete -recheck in AM  Costochondritis When necessary Flexeril  Cocaine abuse -encouraged cessation  Tobacco abuse Advised smk cessation   DVT prophylaxis:  SQ Heparin  Code Status: Full Code   Family Communication: wife  Disposition Plan:     Consultants:   cards   Subjective: Cough medication helped lastPM  Objective: Vitals:   11/27/16 2042 11/27/16 2222 11/28/16 0500 11/28/16 0746  BP: 140/63  117/73 122/62  Pulse: (!) 106  90   Resp: 16  17   Temp: (!) 101.1 F (38.4 C) 99.5 F (37.5 C) 99.8 F (37.7 C) 99.5 F (37.5 C)  TempSrc:  Axillary  Oral  SpO2: 97%  94%   Weight:   117.9 kg (259 lb 14.4 oz)   Height:        Intake/Output Summary (Last 24 hours) at 11/28/16 1326 Last data filed at 11/28/16 0500  Gross per 24 hour  Intake              240 ml  Output                0 ml  Net              240 ml   Filed Weights   11/26/16 0420 11/27/16 0504 11/28/16 0500  Weight: 119.9 kg (264 lb 5.3 oz) 118.8 kg (262 lb) 117.9 kg (259 lb 14.4 oz)    Examination:  General exam: Appears calm and comfortable  Respiratory system:  diminished, no wheezing Cardiovascular system: S1 & S2 heard, RRR. No JVD, murmurs, rubs, gallops or clicks. No pedal edema. Gastrointestinal system: Abdomen is nondistended, soft and nontender. No organomegaly or masses felt. Normal bowel sounds heard. Central nervous system: Alert and oriented. No focal neurological deficits.      Data Reviewed: I have personally reviewed following labs and imaging studies  CBC:  Recent Labs Lab 11/23/16 1037 11/26/16 0436 11/28/16 0429  WBC 8.4 13.2* 15.0*  HGB 11.8* 11.9* 11.4*  HCT 35.8* 36.6* 34.0*  MCV 87.1 85.7 84.8  PLT 280 335 [id number]   Basic Metabolic Panel:  Recent Labs Lab 11/23/16 1037 11/24/16 0354 11/26/16 0436 11/28/16 0429    NA 137 137 134* 131*  K 3.1* 3.3* 3.3* 3.9  CL 102 102 93* 93*  CO2 23 27 27 26   GLUCOSE 146* 123* 124* 128*  BUN 16 10 17  22*  CREATININE 1.26* 1.17 1.24 1.50*  CALCIUM 8.6* 8.9 9.1 9.1  MG 2.0  --   --   --    GFR: Estimated Creatinine Clearance: 86.3 mL/min (by C-G formula based on SCr of 1.5 mg/dL (H)). Liver Function Tests: No results for input(s): AST, ALT, ALKPHOS, BILITOT, PROT, ALBUMIN in the last 168 hours. No results for input(s): LIPASE, AMYLASE in the last 168 hours. No results for input(s): AMMONIA in the last 168 hours. Coagulation Profile: No results for input(s): INR, PROTIME in the last 168 hours. Cardiac Enzymes:  Recent Labs Lab 11/23/16 1745 11/24/16 0836 11/24/16 1402  TROPONINI 0.05* 0.04* 0.04*   BNP (last 3 results) No results for input(s): PROBNP in the last 8760 hours. HbA1C: No results for input(s): HGBA1C in the last 72 hours. CBG: No results for input(s): GLUCAP in the last 168 hours. Lipid Profile: No results for input(s): CHOL, HDL, LDLCALC, TRIG, CHOLHDL, LDLDIRECT in the last 72 hours. Thyroid Function Tests: No results for input(s): TSH, T4TOTAL, FREET4, T3FREE, THYROIDAB in the last 72 hours. Anemia Panel: No results for input(s): VITAMINB12, FOLATE, FERRITIN, TIBC, IRON, RETICCTPCT in the last 72 hours. Urine analysis: No results found for: COLORURINE, APPEARANCEUR, LABSPEC, PHURINE, GLUCOSEU, HGBUR, BILIRUBINUR, KETONESUR, PROTEINUR, UROBILINOGEN, NITRITE, LEUKOCYTESUR   ) Recent Results (from the past 240 hour(s))  Culture, blood (Routine X 2) w Reflex to ID Panel     Status: None (Preliminary result)   Collection Time: 11/24/16  1:55 PM  Result Value Ref Range Status   Specimen Description BLOOD LEFT HAND  Final   Special Requests BOTTLES DRAWN AEROBIC AND ANAEROBIC 10CC  Final   Culture NO GROWTH 3 DAYS  Final   Report Status PENDING  Incomplete  Culture, blood (Routine X 2) w Reflex to ID Panel     Status: None  (Preliminary result)   Collection Time: 11/24/16  2:02 PM  Result Value Ref Range Status   Specimen Description BLOOD RIGHT ANTECUBITAL  Final   Special Requests BOTTLES DRAWN AEROBIC AND ANAEROBIC 10CC  Final   Culture NO GROWTH 3 DAYS  Final   Report Status PENDING  Incomplete      Anti-infectives    None       Radiology Studies: Ct Abdomen Pelvis W Contrast  Result Date: 11/27/2016 CLINICAL DATA:  Patient with multiple pulmonary nodules. Metastatic disease not excluded. EXAM: CT ABDOMEN AND PELVIS WITH CONTRAST TECHNIQUE: Multidetector CT imaging of the abdomen and pelvis was performed using the standard protocol following bolus administration of intravenous contrast. CONTRAST:  100 ISOVUE-300 IOPAMIDOL (ISOVUE-300) INJECTION 61% COMPARISON:  CT chest 11/24/2016. FINDINGS: Lower chest: Normal heart size. Multiple bilateral lower lobe pulmonary nodules are demonstrated measuring up to 3.2 cm within the subpleural left lower lobe. No pleural effusion. Hepatobiliary: Liver is normal in size and contour. Within the central aspect of the liver there is a 4.2 x 4.6 cm (image 22; series 2) low-attenuation lesion. Additionally within the lateral left hepatic lobe there is a 1.7 cm low-attenuation lesion. Within the peripheral right hepatic lobe there is a 1.0 cm low-attenuation lesion (image 18; series 2). Gallbladder is unremarkable. No intrahepatic or extrahepatic biliary ductal dilatation. Pancreas: Unremarkable Spleen: Unremarkable Adrenals/Urinary Tract: The adrenal glands are normal. Kidneys enhance symmetrically with contrast. No hydronephrosis. Too small to characterize low-attenuation lesion interpolar region left kidney. Urinary bladder is unremarkable. Stomach/Bowel: Oral contrast material to the level of the rectum. Normal appendix. No abnormal bowel wall thickening or evidence for bowel obstruction. Normal morphology of the stomach. Vascular/Lymphatic: Peripheral calcified atherosclerotic  plaque. No retroperitoneal lymphadenopathy. Reproductive: Prostate unremarkable. Other: None. Musculoskeletal: Lumbar spine degenerative changes. No aggressive or acute appearing osseous lesions. IMPRESSION: Multiple nodules are demonstrated within the lower lobes bilaterally, many are subpleural in location. Findings raise the possibility of septic emboli. Metastatic disease is not excluded. Multiple low-attenuation lesions within the liver. The largest measures 4.5 cm. These are indeterminate in etiology and need definitive characterization with pre and post contrast-enhanced MRI in the non-acute setting. Electronically Signed   By: Lovey Newcomer M.D.   On: 11/27/2016 21:48        Scheduled Meds: . aspirin EC  325 mg Oral Daily  . chlorthalidone  50 mg Oral Daily  . heparin  5,000 Units Subcutaneous Q8H  . pneumococcal 23 valent vaccine  0.5 mL Intramuscular Tomorrow-1000  . potassium chloride  40 mEq Oral BID  . regadenoson  0.4 mg Intravenous Once   Continuous Infusions: . sodium chloride       LOS: 3 days    Time spent: 25 min    [place], DO [facility] Pager [phone]  If 7PM-7AM, please contact night-coverage [email or web address] Password [email or web address] 11/28/2016, 1:26 PM

## 2016-11-29 LAB — CULTURE, BLOOD (ROUTINE X 2)
CULTURE: NO GROWTH
Culture: NO GROWTH

## 2016-11-29 NOTE — Progress Notes (Signed)
PROGRESS NOTE    Jeremy Hood  [id number] DOB: [birth year] DOA: 11/23/2016 PCP: No PCP Per Patient   Outpatient Specialists:     Brief Narrative:  Jeremy Hood is a 41 y.o. male  pmh of tobacco abuse and uncontrolled hypertension presents emergency room with chief complaint of chest pain. Workup unrevealing so far  Assessment & Plan:   Lung nodules/liver nodules- unclear etiology -TEE negative -autoimmune work up negative thus far-ANA, ANCA, GBM Ab -RF mildly elevated -pulm consult appreciated, HIV negative, ACE negative -MRI abd without contrast notes non specific liver lesions -Bronch a consideration -Quantiferon Tb gold pending, clinically appears less likely  CP- suspect pleuritic -due to above nodules, seen by cards and underwent Myoview which was felt to be low risk, but official read notes mild ant ischemia, workup down the road recommended -d dimer was elevated but CTA negative for PE-- did show nodules- - TEE NEgative-denies IV drug use  Low potassium Repleted  Cocaine abuse -encouraged cessation  Tobacco abuse Advised cessation   DVT prophylaxis:  SQ Heparin  Code Status:Full Code Family Communication:wife Disposition Plan: Home pending workup    Consultants:   cards   Subjective: Feels ok, some chest pain with ins and cough  Objective: Vitals:   11/28/16 1506 11/28/16 2030 11/29/16 0500 11/29/16 0901  BP: (!) 110/51 126/73 130/73 (!) 106/42  Pulse: 88 88 96 70  Resp:  19 (!) 21 18  Temp: 98.7 F (37.1 C) 99.6 F (37.6 C) 98.9 F (37.2 C) 99.4 F (37.4 C)  TempSrc: Oral   Oral  SpO2: 96% 97% 97% 96%  Weight:   117.8 kg (259 lb 12.8 oz)   Height:        Intake/Output Summary (Last 24 hours) at 11/29/16 1005 Last data filed at 11/29/16 0900  Gross per 24 hour  Intake          1853.75 ml  Output                0 ml  Net          1853.75 ml   Filed Weights   11/27/16 0504 11/28/16 0500 11/29/16 0500  Weight: 118.8  kg (262 lb) 117.9 kg (259 lb 14.4 oz) 117.8 kg (259 lb 12.8 oz)    Examination:  General exam: Appears calm and comfortable  Respiratory system: diminished, no wheezing Cardiovascular system: S1 & S2 heard, RRR. No JVD, murmurs, rubs, gallops or clicks. No pedal edema. Gastrointestinal system: Abdomen is nondistended, soft and nontender. No organomegaly or masses felt. Normal bowel sounds heard. Central nervous system: Alert and oriented. No focal neurological deficits. Ext: no edema    Data Reviewed: I have personally reviewed following labs and imaging studies  CBC:  Recent Labs Lab 11/23/16 1037 11/26/16 0436 11/28/16 0429  WBC 8.4 13.2* 15.0*  HGB 11.8* 11.9* 11.4*  HCT 35.8* 36.6* 34.0*  MCV 87.1 85.7 84.8  PLT 280 335 [id number]   Basic Metabolic Panel:  Recent Labs Lab 11/23/16 1037 11/24/16 0354 11/26/16 0436 11/28/16 0429  NA 137 137 134* 131*  K 3.1* 3.3* 3.3* 3.9  CL 102 102 93* 93*  CO2 23 27 27 26   GLUCOSE 146* 123* 124* 128*  BUN 16 10 17  22*  CREATININE 1.26* 1.17 1.24 1.50*  CALCIUM 8.6* 8.9 9.1 9.1  MG 2.0  --   --   --    GFR: Estimated Creatinine Clearance: 85.5 mL/min (by C-G formula based on SCr of 1.5  mg/dL (H)). Liver Function Tests: No results for input(s): AST, ALT, ALKPHOS, BILITOT, PROT, ALBUMIN in the last 168 hours. No results for input(s): LIPASE, AMYLASE in the last 168 hours. No results for input(s): AMMONIA in the last 168 hours. Coagulation Profile: No results for input(s): INR, PROTIME in the last 168 hours. Cardiac Enzymes:  Recent Labs Lab 11/23/16 1745 11/24/16 0836 11/24/16 1402  TROPONINI 0.05* 0.04* 0.04*   BNP (last 3 results) No results for input(s): PROBNP in the last 8760 hours. HbA1C: No results for input(s): HGBA1C in the last 72 hours. CBG: No results for input(s): GLUCAP in the last 168 hours. Lipid Profile: No results for input(s): CHOL, HDL, LDLCALC, TRIG, CHOLHDL, LDLDIRECT in the last 72  hours. Thyroid Function Tests: No results for input(s): TSH, T4TOTAL, FREET4, T3FREE, THYROIDAB in the last 72 hours. Anemia Panel: No results for input(s): VITAMINB12, FOLATE, FERRITIN, TIBC, IRON, RETICCTPCT in the last 72 hours. Urine analysis: No results found for: COLORURINE, APPEARANCEUR, LABSPEC, PHURINE, GLUCOSEU, HGBUR, BILIRUBINUR, KETONESUR, PROTEINUR, UROBILINOGEN, NITRITE, LEUKOCYTESUR   ) Recent Results (from the past 240 hour(s))  Culture, blood (Routine X 2) w Reflex to ID Panel     Status: None (Preliminary result)   Collection Time: 11/24/16  1:55 PM  Result Value Ref Range Status   Specimen Description BLOOD LEFT HAND  Final   Special Requests BOTTLES DRAWN AEROBIC AND ANAEROBIC 10CC  Final   Culture NO GROWTH 4 DAYS  Final   Report Status PENDING  Incomplete  Culture, blood (Routine X 2) w Reflex to ID Panel     Status: None (Preliminary result)   Collection Time: 11/24/16  2:02 PM  Result Value Ref Range Status   Specimen Description BLOOD RIGHT ANTECUBITAL  Final   Special Requests BOTTLES DRAWN AEROBIC AND ANAEROBIC 10CC  Final   Culture NO GROWTH 4 DAYS  Final   Report Status PENDING  Incomplete      Anti-infectives    None       Radiology Studies: Ct Abdomen Pelvis W Contrast  Result Date: 11/27/2016 CLINICAL DATA:  Patient with multiple pulmonary nodules. Metastatic disease not excluded. EXAM: CT ABDOMEN AND PELVIS WITH CONTRAST TECHNIQUE: Multidetector CT imaging of the abdomen and pelvis was performed using the standard protocol following bolus administration of intravenous contrast. CONTRAST:  100 ISOVUE-300 IOPAMIDOL (ISOVUE-300) INJECTION 61% COMPARISON:  CT chest 11/24/2016. FINDINGS: Lower chest: Normal heart size. Multiple bilateral lower lobe pulmonary nodules are demonstrated measuring up to 3.2 cm within the subpleural left lower lobe. No pleural effusion. Hepatobiliary: Liver is normal in size and contour. Within the central aspect of the  liver there is a 4.2 x 4.6 cm (image 22; series 2) low-attenuation lesion. Additionally within the lateral left hepatic lobe there is a 1.7 cm low-attenuation lesion. Within the peripheral right hepatic lobe there is a 1.0 cm low-attenuation lesion (image 18; series 2). Gallbladder is unremarkable. No intrahepatic or extrahepatic biliary ductal dilatation. Pancreas: Unremarkable Spleen: Unremarkable Adrenals/Urinary Tract: The adrenal glands are normal. Kidneys enhance symmetrically with contrast. No hydronephrosis. Too small to characterize low-attenuation lesion interpolar region left kidney. Urinary bladder is unremarkable. Stomach/Bowel: Oral contrast material to the level of the rectum. Normal appendix. No abnormal bowel wall thickening or evidence for bowel obstruction. Normal morphology of the stomach. Vascular/Lymphatic: Peripheral calcified atherosclerotic plaque. No retroperitoneal lymphadenopathy. Reproductive: Prostate unremarkable. Other: None. Musculoskeletal: Lumbar spine degenerative changes. No aggressive or acute appearing osseous lesions. IMPRESSION: Multiple nodules are demonstrated within the lower lobes  bilaterally, many are subpleural in location. Findings raise the possibility of septic emboli. Metastatic disease is not excluded. Multiple low-attenuation lesions within the liver. The largest measures 4.5 cm. These are indeterminate in etiology and need definitive characterization with pre and post contrast-enhanced MRI in the non-acute setting. Electronically Signed   By: Lovey Newcomer M.D.   On: 11/27/2016 21:48   Mr Liver Wo Conrtast  Result Date: 11/29/2016 CLINICAL DATA:  41 year old male inpatient with multiple pulmonary nodules on chest CT performed for dyspnea and chest pain, presents for evaluation of multiple liver lesions on CT abdomen/pelvis study. EXAM: MRI ABDOMEN WITHOUT CONTRAST TECHNIQUE: Multiplanar multisequence MR imaging was performed without the administration of  intravenous contrast. COMPARISON:  11/27/2016 CT abdomen/ pelvis. FINDINGS: Motion degraded scan. Examination was discontinued prior to completion at the patient's request. No T1 weighted imaging or contrast-enhanced imaging was obtained. Lower chest: Re- demonstrated are multiple scattered poorly marginated nodular opacities of various sizes throughout both lungs, not definitely changed from the recent chest CT, measuring up to the 3.2 cm in the posterior lower left lung (series 7/image 1). Hepatobiliary: Normal liver size and configuration. There are three similar-appearing mildly T2 hyperintense (with the T2 hyperintensity intermediate between bile and the spleen) liver masses scattered throughout the liver, measuring 6.0 x 4.2 cm in the caudate lobe (series 12/ image 18), 1.8 x 1.5 cm in the lateral segment left liver lobe (series 12/ image 16) and 0.9 x 0.9 cm in the segment 8 right liver lobe (series 12/ image 11). Normal gallbladder with no cholelithiasis. No biliary ductal dilatation. Common bile duct diameter 3 mm. No choledocholithiasis. Pancreas: No pancreatic mass or duct dilation.  No pancreas divisum. Spleen: Normal size. No mass. Adrenals/Urinary Tract: Normal adrenals. No hydronephrosis. Normal size kidneys. There a few scattered tiny simple appearing renal cysts in both kidneys measuring up to 1.0 cm in the lower left kidney, incompletely characterized on this limited noncontrast MRI. No T2 hypointense renal lesions. Stomach/Bowel: Grossly normal stomach. Visualized small and large bowel is normal caliber, with no bowel wall thickening. Vascular/Lymphatic: Normal caliber abdominal aorta. No pathologically enlarged lymph nodes in the abdomen. Other: No abdominal ascites or focal fluid collection. Musculoskeletal: No aggressive appearing focal osseous lesions. IMPRESSION: 1. Motion degraded incomplete noncontrast MRI study, which was discontinued prior to completion at the patient's request. 2. Re-  demonstration of multiple scattered poorly marginated nodular opacities of various sizes throughout both lungs, not definitely changed from the recent chest CT. The differential diagnosis for these nodular opacities is broad and includes infection, vasculitis or metastatic disease. 3. Three similar-appearing mildly T2 hyperintense liver masses scattered throughout the liver, which are incompletely characterized on this limited noncontrast MRI study and remain indeterminate. The differential includes benign causes such as hemangiomas, with malignancy not excluded. A repeat MRI abdomen without and with IV contrast is recommended on a short-term basis when clinically feasible. 4. Small scattered simple appearing renal cysts, incompletely characterized. Electronically Signed   By: Ilona Sorrel M.D.   On: 11/29/2016 09:05        Scheduled Meds: . aspirin EC  325 mg Oral Daily  . chlorthalidone  50 mg Oral Daily  . heparin  5,000 Units Subcutaneous Q8H  . pneumococcal 23 valent vaccine  0.5 mL Intramuscular Tomorrow-1000  . potassium chloride  40 mEq Oral BID  . regadenoson  0.4 mg Intravenous Once   Continuous Infusions: . sodium chloride 75 mL/hr at 11/28/16 1516     LOS: 4  days    Time spent: 25 min    Domenic Polite, MD [facility] Pager [phone]  If 7PM-7AM, please contact night-coverage [email or web address] Password [email or web address] 11/29/2016, 10:05 AM

## 2016-11-29 NOTE — Progress Notes (Signed)
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 Non productive cough, + sweats but documented fevers. CT chest 11/24/16 revealed bilateral pulmonary nodules ? metastatic dz vz septic emboli. Note toxicology screen  + for above mentioned drugs. He is for TEE 2/7 per cards and PCCM asked to evaluate. We will follow and make recommendations depending on outcome of TEE.  SIGNIFICANT EVENTS    STUDIES:  CTA chest 2/5>>> 1. Negative for acute PE or thoracic aortic dissection. 2. Bilateral pulmonary nodules. May represent metastatic disease versus septic emboli. 3. Trace bilateral pleural effusions.  Pertinent Labs:  TEE 2/7>> no evidence endocarditis  HIV 2/7>> NR Pct 2/7>> 0.11 ESR 2/8> 57 CRP 2/8> 29 RA 2/8> 19 ACE, GBM ab > neg   SUBJECTIVE:  Asking pain med for chest pain. Cough productive first time today "clear mucus".  Per pt -- no known family hx autoimmune disease. Mother and sister had breast cancer but no known family hx lung, pancreatic, colon cancers.   He does smoke marijuana and snorts cocaine occasionally, does not smoke crack cocaine. Does NOT inject any drugs and has never.   VITAL SIGNS: Temp:  [98.7 F (37.1 C)-99.6 F (37.6 C)] 99.4 F (37.4 C) (02/10 0901) Pulse Rate:  [70-96] 70 (02/10 0901) Resp:  [18-21] 18 (02/10 0901) BP: (106-130)/(42-73) 106/42 (02/10 0901) SpO2:  [96 %-97 %] 96 % (02/10 0901) Weight:  [259 lb 12.8 oz (117.8 kg)] 259 lb 12.8 oz (117.8 kg) (02/10 0500)  PHYSICAL EXAMINATION: General:  Pleasant male, NAD in bed, obese HEENT: [place]/AT, PERRL, EOM-I and MMM Neuro: Awake and interactive, moving all ext to command  CV:  RRR, Nl S1/S2, -M/R/G PULM: even/non-labored, lungs bilaterally clear  EL:FYBO, non-tender, bsx4 active  Extremities: warm/dry, no edema  Skin: no rashes or lesions Nodes- none enlarged  Recent Labs Lab 11/24/16 0354 11/26/16 0436 11/28/16 0429  NA 137 134* 131*  K 3.3* 3.3* 3.9  CL 102 93* 93*  CO2 '27 27 26  '$ BUN 10 17 22*  CREATININE 1.17 1.24 1.50*  GLUCOSE 123* 124* 128*    Recent Labs Lab 11/23/16 1037 11/26/16 0436 11/28/16 0429  HGB 11.8* 11.9* 11.4*  HCT 35.8* 36.6* 34.0*  WBC 8.4 13.2* 15.0*  PLT 280 335 393   Ct Abdomen Pelvis W Contrast  Result Date: 11/27/2016 CLINICAL DATA:  Patient with multiple pulmonary nodules. Metastatic disease not excluded. EXAM: CT ABDOMEN AND PELVIS WITH CONTRAST TECHNIQUE: Multidetector CT imaging of the abdomen and pelvis was performed using the standard protocol following bolus administration of intravenous contrast. CONTRAST:  100 ISOVUE-300 IOPAMIDOL (ISOVUE-300) INJECTION 61% COMPARISON:  CT chest 11/24/2016. FINDINGS: Lower chest: Normal heart size. Multiple bilateral lower lobe pulmonary nodules are demonstrated measuring up to 3.2 cm within the subpleural left lower lobe. No pleural effusion. Hepatobiliary: Liver is normal in size and contour. Within the central aspect of the liver there is a 4.2 x 4.6 cm (image 22; series 2) low-attenuation lesion. Additionally within the lateral left hepatic lobe there is a 1.7 cm low-attenuation lesion. Within the peripheral right hepatic lobe there is a 1.0 cm low-attenuation lesion (image 18; series 2). Gallbladder is unremarkable.  No intrahepatic or extrahepatic biliary ductal dilatation. Pancreas: Unremarkable Spleen: Unremarkable Adrenals/Urinary Tract: The adrenal glands are normal. Kidneys enhance symmetrically with contrast. No hydronephrosis. Too small to characterize low-attenuation lesion interpolar region left kidney. Urinary bladder is unremarkable. Stomach/Bowel: Oral contrast material to  the level of the rectum. Normal appendix. No abnormal bowel wall thickening or evidence for bowel obstruction. Normal morphology of the stomach. Vascular/Lymphatic: Peripheral calcified atherosclerotic plaque. No retroperitoneal lymphadenopathy. Reproductive: Prostate unremarkable. Other: None. Musculoskeletal: Lumbar spine degenerative changes. No aggressive or acute appearing osseous lesions. IMPRESSION: Multiple nodules are demonstrated within the lower lobes bilaterally, many are subpleural in location. Findings raise the possibility of septic emboli. Metastatic disease is not excluded. Multiple low-attenuation lesions within the liver. The largest measures 4.5 cm. These are indeterminate in etiology and need definitive characterization with pre and post contrast-enhanced MRI in the non-acute setting. Electronically Signed   By: Lovey Newcomer M.D.   On: 11/27/2016 21:48   Mr Liver Wo Conrtast  Result Date: 11/29/2016 CLINICAL DATA:  41 year old male inpatient with multiple pulmonary nodules on chest CT performed for dyspnea and chest pain, presents for evaluation of multiple liver lesions on CT abdomen/pelvis study. EXAM: MRI ABDOMEN WITHOUT CONTRAST TECHNIQUE: Multiplanar multisequence MR imaging was performed without the administration of intravenous contrast. COMPARISON:  11/27/2016 CT abdomen/ pelvis. FINDINGS: Motion degraded scan. Examination was discontinued prior to completion at the patient's request. No T1 weighted imaging or contrast-enhanced imaging was obtained. Lower chest: Re- demonstrated are multiple scattered poorly marginated nodular opacities of various sizes throughout both lungs, not definitely changed from the recent chest CT, measuring up to the 3.2 cm in the posterior lower left lung (series 7/image 1). Hepatobiliary: Normal liver size and configuration. There are three similar-appearing mildly T2 hyperintense (with the T2 hyperintensity intermediate between bile and the spleen) liver  masses scattered throughout the liver, measuring 6.0 x 4.2 cm in the caudate lobe (series 12/ image 18), 1.8 x 1.5 cm in the lateral segment left liver lobe (series 12/ image 16) and 0.9 x 0.9 cm in the segment 8 right liver lobe (series 12/ image 11). Normal gallbladder with no cholelithiasis. No biliary ductal dilatation. Common bile duct diameter 3 mm. No choledocholithiasis. Pancreas: No pancreatic mass or duct dilation.  No pancreas divisum. Spleen: Normal size. No mass. Adrenals/Urinary Tract: Normal adrenals. No hydronephrosis. Normal size kidneys. There a few scattered tiny simple appearing renal cysts in both kidneys measuring up to 1.0 cm in the lower left kidney, incompletely characterized on this limited noncontrast MRI. No T2 hypointense renal lesions. Stomach/Bowel: Grossly normal stomach. Visualized small and large bowel is normal caliber, with no bowel wall thickening. Vascular/Lymphatic: Normal caliber abdominal aorta. No pathologically enlarged lymph nodes in the abdomen. Other: No abdominal ascites or focal fluid collection. Musculoskeletal: No aggressive appearing focal osseous lesions. IMPRESSION: 1. Motion degraded incomplete noncontrast MRI study, which was discontinued prior to completion at the patient's request. 2. Re- demonstration of multiple scattered poorly marginated nodular opacities of various sizes throughout both lungs, not definitely changed from the recent chest CT. The differential diagnosis for these nodular opacities is broad and includes infection, vasculitis or metastatic disease. 3. Three similar-appearing mildly T2 hyperintense liver masses scattered throughout the liver, which are incompletely characterized on this limited noncontrast MRI study and remain indeterminate. The differential includes benign causes such as hemangiomas, with malignancy not excluded. A repeat MRI abdomen without and with IV contrast is recommended on a short-term basis when clinically feasible.  4.  Small scattered simple appearing renal cysts, incompletely characterized. Electronically Signed   By: Ilona Sorrel M.D.   On: 11/29/2016 09:05   ASSESSMENT   Principal Problem:   Chest pain   HTN (hypertension)   Pulmonary lesion bilateral with suspected emobli   Substance abuse, coacaine and marijuana   Discussion: 41yo male smoker with history of drug abuse presenting with musculoskeletal chest pain and cough.  CT chest revealed bilateral pulmonary nodules thought to be metastatic disease v septic emboli.  TEE negative for endocarditis, HIV neg, pct essentially negative.  Note rising WBC, elevated non-specific inflammatory markers  PLAN   Pulmonary nodules:  Ddx of autoimmune vs mets vs hypersensitivity reaction to cocaine (much more rare)  CRP very elevated. 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 I think bronchoscopy ( not available before Tuesday) or open bx may be options. Awaiting Quant TB, ANCA.           Liver opacity:  - MRI liver protocol ordered, pending results may consider biopsy. If these may be vascular lesions w potential to bleed, then needle bx not good option.  Autoimmune w/u- results noted  - ACE  - RF  - ANA  - ESR  - CRP 29.9  - Anti-GBM  - c-ANCA  - p-ANCA  - Quantiferon gold  Tobacco abuse:  - Smoking cessation  Cocaine abuse:  - Drug rehab  PCCM will follow  CD Young, M.D. [facility] Pulmonary/Critical Care Medicine. Pager: [phone]. After hours pager: [phone].

## 2016-11-30 DIAGNOSIS — R918 Other nonspecific abnormal finding of lung field: Secondary | ICD-10-CM

## 2016-11-30 DIAGNOSIS — R071 Chest pain on breathing: Secondary | ICD-10-CM

## 2016-11-30 LAB — QUANTIFERON IN TUBE
QFT TB AG MINUS NIL VALUE: 0 IU/mL
QUANTIFERON MITOGEN VALUE: 5.47 IU/mL
QUANTIFERON TB AG VALUE: 0.02 [IU]/mL
QUANTIFERON TB GOLD: NEGATIVE
Quantiferon Nil Value: 0.02 IU/mL

## 2016-11-30 LAB — PROCALCITONIN: PROCALCITONIN: 0.15 ng/mL

## 2016-11-30 LAB — QUANTIFERON TB GOLD ASSAY (BLOOD)

## 2016-11-30 MED ORDER — HYDROCOD POLST-CPM POLST ER 10-8 MG/5ML PO SUER
5.0000 mL | Freq: Two times a day (BID) | ORAL | Status: DC | PRN
  Administered 2016-11-30 – 2016-12-03 (×3): 5 mL via ORAL
  Filled 2016-11-30 (×3): qty 5

## 2016-11-30 NOTE — Progress Notes (Signed)
PROGRESS NOTE    Jeremy Hood  [id number] DOB: 17-Apr-1976 DOA: 11/23/2016 PCP: No PCP Per Patient   Outpatient Specialists:   Brief Narrative:  Jeremy Hood is a 41 y.o. male  pmh of tobacco abuse and uncontrolled hypertension presents emergency room with chief complaint of chest pain.  CT chest with Lung nodules, Pulm consulting Workup unrevealing so far.  Assessment & Plan:   Lung nodules/liver nodules- unclear etiology -TEE negative -autoimmune work up negative thus far-ANA, ANCA, GBM Ab -RF mildly elevated -pulm consult appreciated, HIV negative, ACE negative -MRI abd without contrast notes non specific liver lesions -Bronch a consideration -Quantiferon Tb gold pending, clinically appears less likely  CP- suspect pleuritic -due to above nodules, seen by cards and underwent Myoview which was felt to be low risk, but official read notes mild ant ischemia, workup down the road recommended -d dimer was elevated but CTA negative for PE-- did show nodules- - TEE Negative-denies IV drug use  Low potassium -Repleted  Cocaine abuse -encouraged cessation  Tobacco abuse - Advised cessation  DVT prophylaxis: SQ Heparin  Code Status:Full Code Family Communication:wife Disposition Plan: Home pending workup    Consultants:   cards   Subjective: Feels ok, some chest pain with ins and cough  Objective: Vitals:   11/29/16 1945 11/29/16 2000 11/29/16 2142 11/30/16 0400  BP: (!) 145/80 (!) 145/80 140/90 139/76  Pulse: 90 90 (!) 112 86  Resp: 17 17 20 18   Temp: 98.9 F (37.2 C) 98.9 F (37.2 C) 99.6 F (37.6 C) 99 F (37.2 C)  TempSrc:      SpO2: 97% 97% 98% 97%  Weight:    117.8 kg (259 lb 9.6 oz)  Height:        Intake/Output Summary (Last 24 hours) at 11/30/16 1312 Last data filed at 11/30/16 0400  Gross per 24 hour  Intake              840 ml  Output                0 ml  Net              840 ml   Filed Weights   11/28/16 0500 11/29/16  0500 11/30/16 0400  Weight: 117.9 kg (259 lb 14.4 oz) 117.8 kg (259 lb 12.8 oz) 117.8 kg (259 lb 9.6 oz)    Examination:  General exam: Appears calm and comfortable  Respiratory system: diminished, no wheezing Cardiovascular system: S1 & S2 heard, RRR. No JVD, murmurs, rubs, gallops or clicks. No pedal edema. Gastrointestinal system: Abdomen is nondistended, soft and nontender. No organomegaly or masses felt. Normal bowel sounds heard. Central nervous system: Alert and oriented. No focal neurological deficits. Ext: no edema    Data Reviewed: I have personally reviewed following labs and imaging studies  CBC:  Recent Labs Lab 11/26/16 0436 11/28/16 0429  WBC 13.2* 15.0*  HGB 11.9* 11.4*  HCT 36.6* 34.0*  MCV 85.7 84.8  PLT 335 [id number]   Basic Metabolic Panel:  Recent Labs Lab 11/24/16 0354 11/26/16 0436 11/28/16 0429  NA 137 134* 131*  K 3.3* 3.3* 3.9  CL 102 93* 93*  CO2 27 27 26   GLUCOSE 123* 124* 128*  BUN 10 17 22*  CREATININE 1.17 1.24 1.50*  CALCIUM 8.9 9.1 9.1   GFR: Estimated Creatinine Clearance: 85.5 mL/min (by C-G formula based on SCr of 1.5 mg/dL (H)). Liver Function Tests: No results for input(s): AST, ALT, ALKPHOS, BILITOT, PROT,  ALBUMIN in the last 168 hours. No results for input(s): LIPASE, AMYLASE in the last 168 hours. No results for input(s): AMMONIA in the last 168 hours. Coagulation Profile: No results for input(s): INR, PROTIME in the last 168 hours. Cardiac Enzymes:  Recent Labs Lab 11/23/16 1745 11/24/16 0836 11/24/16 1402  TROPONINI 0.05* 0.04* 0.04*   BNP (last 3 results) No results for input(s): PROBNP in the last 8760 hours. HbA1C: No results for input(s): HGBA1C in the last 72 hours. CBG: No results for input(s): GLUCAP in the last 168 hours. Lipid Profile: No results for input(s): CHOL, HDL, LDLCALC, TRIG, CHOLHDL, LDLDIRECT in the last 72 hours. Thyroid Function Tests: No results for input(s): TSH, T4TOTAL, FREET4,  T3FREE, THYROIDAB in the last 72 hours. Anemia Panel: No results for input(s): VITAMINB12, FOLATE, FERRITIN, TIBC, IRON, RETICCTPCT in the last 72 hours. Urine analysis: No results found for: COLORURINE, APPEARANCEUR, LABSPEC, PHURINE, GLUCOSEU, HGBUR, BILIRUBINUR, KETONESUR, PROTEINUR, UROBILINOGEN, NITRITE, LEUKOCYTESUR   ) Recent Results (from the past 240 hour(s))  Culture, blood (Routine X 2) w Reflex to ID Panel     Status: None   Collection Time: 11/24/16  1:55 PM  Result Value Ref Range Status   Specimen Description BLOOD LEFT HAND  Final   Special Requests BOTTLES DRAWN AEROBIC AND ANAEROBIC 10CC  Final   Culture NO GROWTH 5 DAYS  Final   Report Status 11/29/2016 FINAL  Final  Culture, blood (Routine X 2) w Reflex to ID Panel     Status: None   Collection Time: 11/24/16  2:02 PM  Result Value Ref Range Status   Specimen Description BLOOD RIGHT ANTECUBITAL  Final   Special Requests BOTTLES DRAWN AEROBIC AND ANAEROBIC 10CC  Final   Culture NO GROWTH 5 DAYS  Final   Report Status 11/29/2016 FINAL  Final      Anti-infectives    None       Radiology Studies: Mr Liver Wo Conrtast  Result Date: 11/29/2016 CLINICAL DATA:  41 year old male inpatient with multiple pulmonary nodules on chest CT performed for dyspnea and chest pain, presents for evaluation of multiple liver lesions on CT abdomen/pelvis study. EXAM: MRI ABDOMEN WITHOUT CONTRAST TECHNIQUE: Multiplanar multisequence MR imaging was performed without the administration of intravenous contrast. COMPARISON:  11/27/2016 CT abdomen/ pelvis. FINDINGS: Motion degraded scan. Examination was discontinued prior to completion at the patient's request. No T1 weighted imaging or contrast-enhanced imaging was obtained. Lower chest: Re- demonstrated are multiple scattered poorly marginated nodular opacities of various sizes throughout both lungs, not definitely changed from the recent chest CT, measuring up to the 3.2 cm in the  posterior lower left lung (series 7/image 1). Hepatobiliary: Normal liver size and configuration. There are three similar-appearing mildly T2 hyperintense (with the T2 hyperintensity intermediate between bile and the spleen) liver masses scattered throughout the liver, measuring 6.0 x 4.2 cm in the caudate lobe (series 12/ image 18), 1.8 x 1.5 cm in the lateral segment left liver lobe (series 12/ image 16) and 0.9 x 0.9 cm in the segment 8 right liver lobe (series 12/ image 11). Normal gallbladder with no cholelithiasis. No biliary ductal dilatation. Common bile duct diameter 3 mm. No choledocholithiasis. Pancreas: No pancreatic mass or duct dilation.  No pancreas divisum. Spleen: Normal size. No mass. Adrenals/Urinary Tract: Normal adrenals. No hydronephrosis. Normal size kidneys. There a few scattered tiny simple appearing renal cysts in both kidneys measuring up to 1.0 cm in the lower left kidney, incompletely characterized on this limited noncontrast  MRI. No T2 hypointense renal lesions. Stomach/Bowel: Grossly normal stomach. Visualized small and large bowel is normal caliber, with no bowel wall thickening. Vascular/Lymphatic: Normal caliber abdominal aorta. No pathologically enlarged lymph nodes in the abdomen. Other: No abdominal ascites or focal fluid collection. Musculoskeletal: No aggressive appearing focal osseous lesions. IMPRESSION: 1. Motion degraded incomplete noncontrast MRI study, which was discontinued prior to completion at the patient's request. 2. Re- demonstration of multiple scattered poorly marginated nodular opacities of various sizes throughout both lungs, not definitely changed from the recent chest CT. The differential diagnosis for these nodular opacities is broad and includes infection, vasculitis or metastatic disease. 3. Three similar-appearing mildly T2 hyperintense liver masses scattered throughout the liver, which are incompletely characterized on this limited noncontrast MRI study  and remain indeterminate. The differential includes benign causes such as hemangiomas, with malignancy not excluded. A repeat MRI abdomen without and with IV contrast is recommended on a short-term basis when clinically feasible. 4. Small scattered simple appearing renal cysts, incompletely characterized. Electronically Signed   By: Ilona Sorrel M.D.   On: 11/29/2016 09:05        Scheduled Meds: . aspirin EC  325 mg Oral Daily  . chlorthalidone  50 mg Oral Daily  . heparin  5,000 Units Subcutaneous Q8H  . pneumococcal 23 valent vaccine  0.5 mL Intramuscular Tomorrow-1000  . potassium chloride  40 mEq Oral BID  . regadenoson  0.4 mg Intravenous Once   Continuous Infusions:    LOS: 5 days    Time spent: 25 min    Domenic Polite, MD [facility] Pager [phone]  If 7PM-7AM, please contact night-coverage [email or web address] Password [email or web address] 11/30/2016, 1:12 PM

## 2016-11-30 NOTE — Progress Notes (Signed)
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 Non productive cough, + sweats but documented fevers. CT chest 11/24/16 revealed bilateral pulmonary nodules ? metastatic dz vz septic emboli. Note toxicology screen  + for above mentioned drugs. He is for TEE 2/7 per cards and PCCM asked to evaluate. We will follow and make recommendations depending on outcome of TEE.  SIGNIFICANT EVENTS    STUDIES:  CTA chest 2/5>>> 1. Negative for acute PE or thoracic aortic dissection. 2. Bilateral pulmonary nodules. May represent metastatic disease versus septic emboli. 3. Trace bilateral pleural effusions.  Pertinent Labs:  TEE 2/7>> no evidence endocarditis  HIV 2/7>> NR Pct 2/7>> 0.11 ESR 2/8> 57 CRP 2/8> 29 RA 2/8> 19 ACE, GBM ab > neg   SUBJECTIVE:  Has again coughed "clear mucus". Denies pain, nodes, rash or focal concerns this AM and feeling better.  Per pt -- no known family hx autoimmune disease. Mother and sister had breast cancer but no known family hx lung, pancreatic, colon cancers.   He does smoke marijuana and snorts cocaine occasionally, does not smoke crack cocaine. Does NOT inject any drugs and has never.   VITAL SIGNS: Temp:  [98.9 F (37.2 C)-99.6 F (37.6 C)] 99 F (37.2 C) (02/11 0400) Pulse Rate:  [70-112] 86 (02/11 0400) Resp:  [17-20] 18 (02/11 0400) BP: (106-145)/(42-90) 139/76 (02/11 0400) SpO2:  [96 %-98 %] 97 % (02/11 0400) Weight:  [259 lb 9.6 oz (117.8 kg)] 259 lb 9.6 oz (117.8 kg) (02/11 0400)  PHYSICAL EXAMINATION: Exam unchanged cw 2/10 General:  Pleasant male, NAD in bed, obese HEENT: [place]/AT, PERRL, EOM-I and MMM Neuro: Awake and  interactive, moving all ext to command  CV: RRR, Nl S1/S2, -M/R/G PULM: even/non-labored, lungs bilaterally clear  JK:DTOI, non-tender, bsx4 active  Extremities: warm/dry, no edema  Skin: no rashes or lesions Nodes- none enlarged  Recent Labs Lab 11/24/16 0354 11/26/16 0436 11/28/16 0429  NA 137 134* 131*  K 3.3* 3.3* 3.9  CL 102 93* 93*  CO2 '27 27 26  '$ BUN 10 17 22*  CREATININE 1.17 1.24 1.50*  GLUCOSE 123* 124* 128*    Recent Labs Lab 11/23/16 1037 11/26/16 0436 11/28/16 0429  HGB 11.8* 11.9* 11.4*  HCT 35.8* 36.6* 34.0*  WBC 8.4 13.2* 15.0*  PLT 280 335 393   Mr Liver Wo Conrtast  Result Date: 11/29/2016 CLINICAL DATA:  41 year old male inpatient with multiple pulmonary nodules on chest CT performed for dyspnea and chest pain, presents for evaluation of multiple liver lesions on CT abdomen/pelvis study. EXAM: MRI ABDOMEN WITHOUT CONTRAST TECHNIQUE: Multiplanar multisequence MR imaging was performed without the administration of intravenous contrast. COMPARISON:  11/27/2016 CT abdomen/ pelvis. FINDINGS: Motion degraded scan. Examination was discontinued prior to completion at the patient's request. No T1 weighted imaging or contrast-enhanced imaging was obtained. Lower chest: Re- demonstrated are multiple scattered poorly marginated nodular opacities of various sizes throughout both lungs, not definitely changed from the recent chest CT, measuring up to the 3.2 cm in the posterior lower left lung (series 7/image 1). Hepatobiliary: Normal liver size and configuration. There are three similar-appearing mildly T2 hyperintense (with the T2 hyperintensity intermediate between  bile and the spleen) liver masses scattered throughout the liver, measuring 6.0 x 4.2 cm in the caudate lobe (series 12/ image 18), 1.8 x 1.5 cm in the lateral segment left liver lobe (series 12/ image 16) and 0.9 x 0.9 cm in the segment 8 right liver lobe (series 12/ image 11). Normal gallbladder with no  cholelithiasis. No biliary ductal dilatation. Common bile duct diameter 3 mm. No choledocholithiasis. Pancreas: No pancreatic mass or duct dilation.  No pancreas divisum. Spleen: Normal size. No mass. Adrenals/Urinary Tract: Normal adrenals. No hydronephrosis. Normal size kidneys. There a few scattered tiny simple appearing renal cysts in both kidneys measuring up to 1.0 cm in the lower left kidney, incompletely characterized on this limited noncontrast MRI. No T2 hypointense renal lesions. Stomach/Bowel: Grossly normal stomach. Visualized small and large bowel is normal caliber, with no bowel wall thickening. Vascular/Lymphatic: Normal caliber abdominal aorta. No pathologically enlarged lymph nodes in the abdomen. Other: No abdominal ascites or focal fluid collection. Musculoskeletal: No aggressive appearing focal osseous lesions. IMPRESSION: 1. Motion degraded incomplete noncontrast MRI study, which was discontinued prior to completion at the patient's request. 2. Re- demonstration of multiple scattered poorly marginated nodular opacities of various sizes throughout both lungs, not definitely changed from the recent chest CT. The differential diagnosis for these nodular opacities is broad and includes infection, vasculitis or metastatic disease. 3. Three similar-appearing mildly T2 hyperintense liver masses scattered throughout the liver, which are incompletely characterized on this limited noncontrast MRI study and remain indeterminate. The differential includes benign causes such as hemangiomas, with malignancy not excluded. A repeat MRI abdomen without and with IV contrast is recommended on a short-term basis when clinically feasible. 4. Small scattered simple appearing renal cysts, incompletely characterized. Electronically Signed   By: Ilona Sorrel M.D.   On: 11/29/2016 09:05   ASSESSMENT   Principal Problem:   Chest pain   HTN (hypertension)   Pulmonary lesion bilateral with suspected emobli    Substance abuse, coacaine and marijuana   Discussion: 41yo male smoker with history of drug abuse presenting with musculoskeletal chest pain and cough.  CT chest revealed bilateral pulmonary nodules thought to be metastatic disease v septic emboli.  TEE negative for endocarditis, HIV neg, pct essentially negative.  Note rising WBC, elevated non-specific inflammatory markers  PLAN   Pulmonary nodules:  Ddx of autoimmune vs mets vs hypersensitivity reaction to cocaine (much more rare)  CRP very elevated. 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 I think bronchoscopy ( not available before Tuesday) or open bx may be options. Awaiting Quant TB, ANCA.           Liver opacity:  - MRI liver protocol ordered, pending results may consider biopsy. If these may be vascular lesions w potential to bleed, then needle bx not good option.  Autoimmune w/u- results noted  - ACE  - RF  - ANA  - ESR  - CRP 29.9  - Anti-GBM  - c-ANCA  - p-ANCA  - Quantiferon gold  Tobacco abuse:  - Smoking cessation  Cocaine abuse:  - Drug rehab   I have ordered 2V CXR, CBC w diff, ESR, CRP, BMET for 2/12  PCCM will follow  CD Annamaria Boots, M.D. [facility] Pulmonary/Critical Care Medicine. Pager: [phone]. After hours pager: [phone].

## 2016-12-01 ENCOUNTER — Inpatient Hospital Stay (HOSPITAL_COMMUNITY): Payer: Self-pay

## 2016-12-01 ENCOUNTER — Inpatient Hospital Stay: Payer: Self-pay

## 2016-12-01 ENCOUNTER — Telehealth: Payer: Self-pay

## 2016-12-01 LAB — CBC WITH DIFFERENTIAL/PLATELET
BASOS PCT: 0 %
Basophils Absolute: 0 10*3/uL (ref 0.0–0.1)
EOS PCT: 3 %
Eosinophils Absolute: 0.6 10*3/uL (ref 0.0–0.7)
HEMATOCRIT: 32.2 % — AB (ref 39.0–52.0)
HEMOGLOBIN: 10.9 g/dL — AB (ref 13.0–17.0)
LYMPHS PCT: 12 %
Lymphs Abs: 2.3 10*3/uL (ref 0.7–4.0)
MCH: 28.8 pg (ref 26.0–34.0)
MCHC: 33.9 g/dL (ref 30.0–36.0)
MCV: 85 fL (ref 78.0–100.0)
MONOS PCT: 6 %
Monocytes Absolute: 1.1 10*3/uL — ABNORMAL HIGH (ref 0.1–1.0)
NEUTROS ABS: 15.1 10*3/uL — AB (ref 1.7–7.7)
NEUTROS PCT: 79 %
Platelets: 556 10*3/uL — ABNORMAL HIGH (ref 150–400)
RBC: 3.79 MIL/uL — ABNORMAL LOW (ref 4.22–5.81)
RDW: 14.3 % (ref 11.5–15.5)
WBC: 19.1 10*3/uL — ABNORMAL HIGH (ref 4.0–10.5)

## 2016-12-01 LAB — BASIC METABOLIC PANEL
Anion gap: 13 (ref 5–15)
BUN: 20 mg/dL (ref 6–20)
CHLORIDE: 94 mmol/L — AB (ref 101–111)
CO2: 24 mmol/L (ref 22–32)
CREATININE: 1.45 mg/dL — AB (ref 0.61–1.24)
Calcium: 9.4 mg/dL (ref 8.9–10.3)
GFR calc Af Amer: >60 mL/min (ref >60)
GFR calc non Af Amer: 59 mL/min — ABNORMAL LOW (ref >60)
Glucose, Bld: 139 mg/dL — ABNORMAL HIGH (ref 65–99)
POTASSIUM: 4.1 mmol/L (ref 3.5–5.1)
SODIUM: 131 mmol/L — AB (ref 135–145)

## 2016-12-01 LAB — PROTIME-INR
INR: 1.2
Prothrombin Time: 15.3 seconds — ABNORMAL HIGH (ref 11.4–15.2)

## 2016-12-01 LAB — APTT: APTT: 37 s — AB (ref 24–36)

## 2016-12-01 LAB — SEDIMENTATION RATE: SED RATE: 95 mm/h — AB (ref 0–16)

## 2016-12-01 LAB — C-REACTIVE PROTEIN: CRP: 24.7 mg/dL — AB (ref <1.0)

## 2016-12-01 MED ORDER — VANCOMYCIN HCL IN DEXTROSE 750-5 MG/150ML-% IV SOLN
750.0000 mg | Freq: Two times a day (BID) | INTRAVENOUS | Status: DC
  Administered 2016-12-01 – 2016-12-02 (×3): 750 mg via INTRAVENOUS
  Filled 2016-12-01 (×4): qty 150

## 2016-12-01 MED ORDER — DEXTROSE 5 % IV SOLN
2.0000 g | INTRAVENOUS | Status: DC
  Administered 2016-12-01: 2 g via INTRAVENOUS
  Filled 2016-12-01 (×2): qty 2

## 2016-12-01 MED ORDER — VANCOMYCIN HCL 10 G IV SOLR
2000.0000 mg | Freq: Once | INTRAVENOUS | Status: AC
  Administered 2016-12-01: 2000 mg via INTRAVENOUS
  Filled 2016-12-01: qty 2000

## 2016-12-01 NOTE — Progress Notes (Signed)
 [facility] appt resched til 12-08-16 at 3: 45pm.

## 2016-12-01 NOTE — Progress Notes (Signed)
Pharmacy Antibiotic Note  Jeremy Hood is a 41 y.o. male admitted on 11/23/2016 with chest pain.  Pharmacy has been consulted for Vancomycin dosing for Endocarditis.  Plan: Vancomycin 2g IV x1 then 750 mg IV q12h Monitor clinical status, renal function, culture Steady state vanc trough as needed.  Height: 5\' 11"  (180.3 cm) Weight: 259 lb 9.6 oz (117.8 kg) IBW/kg (Calculated) : 75.3  Adjusted body wt 101 kg  Temp (24hrs), Avg:99.8 F (37.7 C), Min:99 F (37.2 C), Max:100.3 F (37.9 C)   Recent Labs Lab 11/26/16 0436 11/28/16 0429 12/01/16 0608  WBC 13.2* 15.0* 19.1*  CREATININE 1.24 1.50* 1.45*    Estimated Creatinine Clearance: 88.4 mL/min (by C-G formula based on SCr of 1.45 mg/dL (H)).    No Known Allergies  Antimicrobials this admission: 2/12 Ceftriaxone>>  2/12 Vancomycin >>  Dose adjustments this admission:   Microbiology results: 2/5 BCx: neg   Thank you for allowing pharmacy to be a part of this patient's care.  Nicole Cella, RPh Clinical Pharmacist Pager: [phone] 8A-4P [phone] 4P-10P [phone] [place] [phone] 12/01/2016 8:47 AM

## 2016-12-01 NOTE — Telephone Encounter (Signed)
Message received from Elissa Hefty, RN CM noting that the patient is still  Hospitalized and she would like to cancel his appointment at [facility] for today and reschedule.  The appointment for today was cancelled and rescheduled for 12/08/16 @ 1545 and the information was placed on the AVS. Update provided to D. Nelida Gores, RN CM

## 2016-12-01 NOTE — Progress Notes (Signed)
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 Non productive cough, + sweats but documented fevers. CT chest 11/24/16 revealed bilateral pulmonary nodules ? metastatic dz vz septic emboli. Note toxicology screen  + for above mentioned drugs  SIGNIFICANT EVENTS    STUDIES:  CTA chest 2/5>>> 1. Negative for acute PE or thoracic aortic dissection. 2. Bilateral pulmonary nodules. May represent metastatic disease versus septic emboli. 3. Trace bilateral pleural effusions.  2/12 CT chest>>>  Pertinent Labs:  TEE 2/7>> no evidence endocarditis  HIV 2/7>> NR Pct 2/7>> 0.11 ESR 2/8> 57 CRP 2/8> 29 RA 2/8> 19 ACE, GBM ab > neg   SUBJECTIVE:   VITAL SIGNS: Temp:  [99 F (37.2 C)-100.3 F (37.9 C)] 100.3 F (37.9 C) (02/12 0523) Pulse Rate:  [87-98] 98 (02/12 0523) Resp:  [18-21] 21 (02/12 0523) BP: (122-151)/(63-76) 151/68 (02/12 0523) SpO2:  [96 %-99 %] 99 % (02/12 0523) Weight:  [117.8 kg (259 lb 9.6 oz)] 117.8 kg (259 lb 9.6 oz) (02/12 0523)  PHYSICAL EXAMINATION: Exam unchanged cw 2/10 General:  Pleasant male, NAD in bed, obese, on RA HEENT: jvd wnl, no sig lymphad Neuro: Awake and interactive, moving all ext to command  CV: RRR, Nl S1/S2, -M/R/G PULM: no distress,. Clear FK:CLEX, non-tender, bsx4 active  Extremities: warm/dry, no edema  Skin: no rashes or lesions Nodes- none enlarged  Recent Labs Lab 11/26/16 0436 11/28/16 0429 12/01/16 0608  NA 134* 131* 131*  K 3.3* 3.9 4.1  CL 93* 93* 94*  CO2 _0 BUN 17 22* 20  CREATININE 1.24 1.50* 1.45*  GLUCOSE 124* 128* 139*    Recent Labs Lab 11/26/16 0436 11/28/16 0429 12/01/16 0608   HGB 11.9* 11.4* 10.9*  HCT 36.6* 34.0* 32.2*  WBC 13.2* 15.0* 19.1*  PLT 335 393 556*   Dg Chest 2 View  Result Date: 12/01/2016 CLINICAL DATA:  Cough and shortness of breath. Pulmonary nodular lesions. EXAM: CHEST  2 VIEW COMPARISON:  November 26, 2016 FINDINGS: Nodular opacities have become larger bilaterally compared to recent study. Largest nodular lesion is in the right upper lobe, currently measuring 3.2 x 2.3 cm. There is a new nodular lesion in the left mid lung which has an area of central cavitation. This lesion measures 2.4 x 2.2 cm. There is ill-defined opacity in the right base which is new. Heart size and pulmonary vascularity are normal. No adenopathy. No bone lesions. IMPRESSION: Several new and several enlarging nodular lesions bilaterally. Nodular lesion in the left mid lung has central cavitation. This appearance is most consistent with septic emboli. New area of apparent airspace consolidation right base, likely superimposed pneumonia. Heart size within normal limits. No adenopathy evident. These results will be called to the ordering clinician or representative by the Radiologist Assistant, and communication documented in the PACS or zVision Dashboard. Electronically Signed   By: Lowella Grip III M.D.   On: 12/01/2016 07:57   ASSESSMENT   Principal Problem:   Chest pain   HTN (hypertension)   Pulmonary lesion bilateral with suspected emobli   Substance abuse, coacaine and marijuana   Discussion: 41yo male smoker with history of drug abuse  presenting with musculoskeletal chest pain and cough.  CT chest revealed bilateral pulmonary nodules thought to be metastatic disease v septic emboli.  TEE negative for endocarditis, HIV neg, pct essentially negative.  Note rising WBC, elevated non-specific inflammatory markers WORSENING LESIONS on pcxr 2/12  PLAN   Pulmonary nodules:  Ddx of autoimmune vs mets vs hypersensitivity reaction to cocaine (much more rare) TEE neg,  cant r/o infected endocarditis still given clinical circumstance R/o autoimmune lesions Worsening lesions today on pcxr favors infective -given worsening lesions and continued fevers, would add empiric abx now, get ID consult -would repeat CT chest now, may need empiric steroids with progression          -pending above, I may Bronch bal tues ( keep NPO midnight)- scheduled 930 am, I consented pt also - will ensure have coags  Liver opacity:  - MRI done  Autoimmune w/u- results noted  - ACE 29 neg  - RF - 19  high  - ANA neg  - ESR 95 high  - CRP 29.9--> 24.7  - Anti-GBM- neg  - c-ANCA neg  - p-ANCA  - Quantiferon gold  Tobacco abuse:  - Smoking cessation  Cocaine abuse:  - Drug rehab  updated pt and wife   [name]. Titus Mould, MD, [place] Pgr: [facility]

## 2016-12-01 NOTE — Progress Notes (Signed)
PROGRESS NOTE    Jeremy Hood  [id number] DOB: [birth year] DOA: 11/23/2016 PCP: No PCP Per Patient   Outpatient Specialists:   Brief Narrative:  Jeremy Hood is a 41 y.o. male  pmh of tobacco abuse and uncontrolled hypertension presents emergency room with chief complaint of chest pain.  CT chest with Lung nodules, Pulm consulting Workup unrevealing so far.  Assessment & Plan:   Lung nodules/liver nodules- unclear etiology -TEE negative -autoimmune work up negative thus far-ANA, ANCA, GBM Ab -RF mildly elevated -pulm consult appreciated, HIV negative, ACE negative,Quantiferon Tb gold pending -MRI abd without contrast notes non specific liver lesions -Bronch planned for tomorrow, low grade fevers overnight and WBC trending up, CT chest ordered  CP- suspect pleuritic -due to above nodules, seen by cards and underwent Myoview which was felt to be low risk, but official read notes mild ant ischemia, workup down the road recommended -d dimer was elevated but CTA negative for PE-- did show nodules- - TEE Negative-denies IV drug use - see above  Low potassium -Repleted  Cocaine abuse -encouraged cessation  Tobacco abuse - Advised cessation  DVT prophylaxis: SQ Heparin  Code Status:Full Code Family Communication:wife Disposition Plan: Home pending workup    Consultants:   cards   Subjective: Feels ok, some chest pain with inspiration and cough, low grade fever overnight  Objective: Vitals:   11/30/16 0400 11/30/16 1330 11/30/16 2118 12/01/16 0523  BP: 139/76 123/76 122/63 (!) 151/68  Pulse: 86 87 97 98  Resp: 18 18 20  (!) 21  Temp: 99 F (37.2 C) 99 F (37.2 C) 100.1 F (37.8 C) 100.3 F (37.9 C)  TempSrc:  Oral Oral   SpO2: 97% 96% 98% 99%  Weight: 117.8 kg (259 lb 9.6 oz)   117.8 kg (259 lb 9.6 oz)  Height:        Intake/Output Summary (Last 24 hours) at 12/01/16 1157 Last data filed at 12/01/16 1100  Gross per 24 hour  Intake              2040 ml  Output                8 ml  Net             2032 ml   Filed Weights   11/29/16 0500 11/30/16 0400 12/01/16 0523  Weight: 117.8 kg (259 lb 12.8 oz) 117.8 kg (259 lb 9.6 oz) 117.8 kg (259 lb 9.6 oz)    Examination:  General exam: Appears calm and comfortable  Respiratory system: diminished, no wheezing Cardiovascular system: S1 & S2 heard, RRR. No JVD, murmurs, rubs, gallops or clicks. No pedal edema. Gastrointestinal system: Abdomen is nondistended, soft and nontender. No organomegaly or masses felt. Normal bowel sounds heard. Central nervous system: Alert and oriented. No focal neurological deficits. Ext: no edema    Data Reviewed: I have personally reviewed following labs and imaging studies  CBC:  Recent Labs Lab 11/26/16 0436 11/28/16 0429 12/01/16 0608  WBC 13.2* 15.0* 19.1*  NEUTROABS  --   --  15.1*  HGB 11.9* 11.4* 10.9*  HCT 36.6* 34.0* 32.2*  MCV 85.7 84.8 85.0  PLT 335 393 [id number]*   Basic Metabolic Panel:  Recent Labs Lab 11/26/16 0436 11/28/16 0429 12/01/16 0608  NA 134* 131* 131*  K 3.3* 3.9 4.1  CL 93* 93* 94*  CO2 27 26 24   GLUCOSE 124* 128* 139*  BUN 17 22* 20  CREATININE 1.24 1.50* 1.45*  CALCIUM 9.1  9.1 9.4   GFR: Estimated Creatinine Clearance: 88.4 mL/min (by C-G formula based on SCr of 1.45 mg/dL (H)). Liver Function Tests: No results for input(s): AST, ALT, ALKPHOS, BILITOT, PROT, ALBUMIN in the last 168 hours. No results for input(s): LIPASE, AMYLASE in the last 168 hours. No results for input(s): AMMONIA in the last 168 hours. Coagulation Profile:  Recent Labs Lab 12/01/16 1101  INR 1.20   Cardiac Enzymes:  Recent Labs Lab 11/24/16 1402  TROPONINI 0.04*   BNP (last 3 results) No results for input(s): PROBNP in the last 8760 hours. HbA1C: No results for input(s): HGBA1C in the last 72 hours. CBG: No results for input(s): GLUCAP in the last 168 hours. Lipid Profile: No results for input(s): CHOL, HDL,  LDLCALC, TRIG, CHOLHDL, LDLDIRECT in the last 72 hours. Thyroid Function Tests: No results for input(s): TSH, T4TOTAL, FREET4, T3FREE, THYROIDAB in the last 72 hours. Anemia Panel: No results for input(s): VITAMINB12, FOLATE, FERRITIN, TIBC, IRON, RETICCTPCT in the last 72 hours. Urine analysis: No results found for: COLORURINE, APPEARANCEUR, LABSPEC, PHURINE, GLUCOSEU, HGBUR, BILIRUBINUR, KETONESUR, PROTEINUR, UROBILINOGEN, NITRITE, LEUKOCYTESUR   ) Recent Results (from the past 240 hour(s))  Culture, blood (Routine X 2) w Reflex to ID Panel     Status: None   Collection Time: 11/24/16  1:55 PM  Result Value Ref Range Status   Specimen Description BLOOD LEFT HAND  Final   Special Requests BOTTLES DRAWN AEROBIC AND ANAEROBIC 10CC  Final   Culture NO GROWTH 5 DAYS  Final   Report Status 11/29/2016 FINAL  Final  Culture, blood (Routine X 2) w Reflex to ID Panel     Status: None   Collection Time: 11/24/16  2:02 PM  Result Value Ref Range Status   Specimen Description BLOOD RIGHT ANTECUBITAL  Final   Special Requests BOTTLES DRAWN AEROBIC AND ANAEROBIC 10CC  Final   Culture NO GROWTH 5 DAYS  Final   Report Status 11/29/2016 FINAL  Final      Anti-infectives    Start     Dose/Rate Route Frequency Ordered Stop   12/01/16 2200  vancomycin (VANCOCIN) IVPB 750 mg/150 ml premix     750 mg 150 mL/hr over 60 Minutes Intravenous Every 12 hours 12/01/16 0855     12/01/16 1000  vancomycin (VANCOCIN) 2,000 mg in sodium chloride 0.9 % 500 mL IVPB     2,000 mg 250 mL/hr over 120 Minutes Intravenous  Once 12/01/16 0855     12/01/16 0930  cefTRIAXone (ROCEPHIN) 2 g in dextrose 5 % 50 mL IVPB     2 g 100 mL/hr over 30 Minutes Intravenous Every 24 hours 12/01/16 0834         Radiology Studies: Dg Chest 2 View  Result Date: 12/01/2016 CLINICAL DATA:  Cough and shortness of breath. Pulmonary nodular lesions. EXAM: CHEST  2 VIEW COMPARISON:  November 26, 2016 FINDINGS: Nodular opacities have  become larger bilaterally compared to recent study. Largest nodular lesion is in the right upper lobe, currently measuring 3.2 x 2.3 cm. There is a new nodular lesion in the left mid lung which has an area of central cavitation. This lesion measures 2.4 x 2.2 cm. There is ill-defined opacity in the right base which is new. Heart size and pulmonary vascularity are normal. No adenopathy. No bone lesions. IMPRESSION: Several new and several enlarging nodular lesions bilaterally. Nodular lesion in the left mid lung has central cavitation. This appearance is most consistent with septic emboli. New area  of apparent airspace consolidation right base, likely superimposed pneumonia. Heart size within normal limits. No adenopathy evident. These results will be called to the ordering clinician or representative by the Radiologist Assistant, and communication documented in the PACS or zVision Dashboard. Electronically Signed   By: Lowella Grip III M.D.   On: 12/01/2016 07:57   Ct Chest Wo Contrast  Result Date: 12/01/2016 CLINICAL DATA:  Pulmonary nodules.  Cough. EXAM: CT CHEST WITHOUT CONTRAST TECHNIQUE: Multidetector CT imaging of the chest was performed following the standard protocol without IV contrast. COMPARISON:  11/24/2016. FINDINGS: Cardiovascular: Vascular structures are unremarkable. Heart size normal. No pericardial effusion. Mediastinum/Nodes: Mediastinal lymph nodes are not enlarged by CT size criteria. Hilar regions are difficult to evaluate without IV contrast. No axillary adenopathy. Esophagus is grossly unremarkable. Lungs/Pleura: New and enlarging rounded areas of consolidation with surrounding ground-glass are seen bilaterally. Some are now cavitary, for example 2 lesions in the left upper lobe. No pleural fluid. Airway is unremarkable. Upper Abdomen: Visualized portions of the liver, adrenal glands, right kidney, spleen, pancreas, stomach and bowel are grossly unremarkable. Musculoskeletal: No  worrisome lytic or sclerotic lesions. IMPRESSION: New and enlarging areas of rounded consolidation with surrounding ground-glass and developing cavitation, most indicative of septic emboli. Electronically Signed   By: Lorin Picket M.D.   On: 12/01/2016 11:06        Scheduled Meds: . aspirin EC  325 mg Oral Daily  . cefTRIAXone (ROCEPHIN)  IV  2 g Intravenous Q24H  . chlorthalidone  50 mg Oral Daily  . heparin  5,000 Units Subcutaneous Q8H  . pneumococcal 23 valent vaccine  0.5 mL Intramuscular Tomorrow-1000  . potassium chloride  40 mEq Oral BID  . regadenoson  0.4 mg Intravenous Once  . vancomycin  2,000 mg Intravenous Once  . vancomycin  750 mg Intravenous Q12H   Continuous Infusions:    LOS: 6 days    Time spent: 25 min    Domenic Polite, MD [facility] Pager [phone]  If 7PM-7AM, please contact night-coverage [email or web address] Password [facility] 12/01/2016, 11:57 AM

## 2016-12-02 ENCOUNTER — Encounter (HOSPITAL_COMMUNITY)
Admission: EM | Disposition: A | Discharge status: Home / Self Care | Payer: Self-pay | Source: Home / Self Care | Attending: Internal Medicine

## 2016-12-02 ENCOUNTER — Telehealth: Payer: Self-pay

## 2016-12-02 ENCOUNTER — Inpatient Hospital Stay (HOSPITAL_COMMUNITY): Payer: Self-pay

## 2016-12-02 DIAGNOSIS — K0889 Other specified disorders of teeth and supporting structures: Secondary | ICD-10-CM

## 2016-12-02 DIAGNOSIS — Z9089 Acquired absence of other organs: Secondary | ICD-10-CM

## 2016-12-02 DIAGNOSIS — Z833 Family history of diabetes mellitus: Secondary | ICD-10-CM

## 2016-12-02 DIAGNOSIS — Z8249 Family history of ischemic heart disease and other diseases of the circulatory system: Secondary | ICD-10-CM

## 2016-12-02 DIAGNOSIS — K047 Periapical abscess without sinus: Secondary | ICD-10-CM

## 2016-12-02 DIAGNOSIS — M940 Chondrocostal junction syndrome [Tietze]: Principal | ICD-10-CM

## 2016-12-02 DIAGNOSIS — J984 Other disorders of lung: Secondary | ICD-10-CM

## 2016-12-02 HISTORY — PX: VIDEO BRONCHOSCOPY: SHX5072

## 2016-12-02 SURGERY — VIDEO BRONCHOSCOPY WITHOUT FLUORO
Anesthesia: Moderate Sedation | Laterality: Bilateral

## 2016-12-02 MED ORDER — LIDOCAINE HCL (PF) 1 % IJ SOLN
INTRAMUSCULAR | Status: DC | PRN
  Administered 2016-12-02: 6 mL

## 2016-12-02 MED ORDER — SODIUM CHLORIDE 0.9 % IV SOLN
INTRAVENOUS | Status: DC
  Administered 2016-12-02: 09:00:00 via INTRAVENOUS

## 2016-12-02 MED ORDER — SODIUM CHLORIDE 0.9 % IV SOLN
3.0000 g | Freq: Four times a day (QID) | INTRAVENOUS | Status: DC
  Administered 2016-12-02 – 2016-12-03 (×7): 3 g via INTRAVENOUS
  Filled 2016-12-02 (×10): qty 3

## 2016-12-02 MED ORDER — FENTANYL CITRATE (PF) 100 MCG/2ML IJ SOLN
INTRAMUSCULAR | Status: AC
  Filled 2016-12-02: qty 4

## 2016-12-02 MED ORDER — PHENYLEPHRINE HCL 0.25 % NA SOLN
NASAL | Status: DC | PRN
  Administered 2016-12-02: 2 via NASAL

## 2016-12-02 MED ORDER — PHENYLEPHRINE HCL 1 % NA SOLN: 1.0000 [drp] | Freq: Four times a day (QID) | NASAL | Status: DC | PRN

## 2016-12-02 MED ORDER — FENTANYL CITRATE (PF) 100 MCG/2ML IJ SOLN
INTRAMUSCULAR | Status: DC | PRN
Start: 2016-12-02 — End: 2016-12-02
  Administered 2016-12-02 (×2): 50 ug via INTRAVENOUS

## 2016-12-02 MED ORDER — MIDAZOLAM HCL 5 MG/ML IJ SOLN
INTRAMUSCULAR | Status: AC
  Filled 2016-12-02: qty 2

## 2016-12-02 MED ORDER — LIDOCAINE HCL 2 % EX GEL
CUTANEOUS | Status: DC | PRN
  Administered 2016-12-02: 1

## 2016-12-02 MED ORDER — LIDOCAINE HCL 2 % EX GEL: 1.0000 "application " | Freq: Once | CUTANEOUS | Status: AC

## 2016-12-02 MED ORDER — MIDAZOLAM HCL 10 MG/2ML IJ SOLN
INTRAMUSCULAR | Status: DC | PRN
  Administered 2016-12-02 (×2): 1 mg via INTRAVENOUS

## 2016-12-02 MED ORDER — BUTAMBEN-TETRACAINE-BENZOCAINE 2-2-14 % EX AERO: 1.0000 | INHALATION_SPRAY | Freq: Once | CUTANEOUS | Status: DC

## 2016-12-02 NOTE — Telephone Encounter (Signed)
Message received from Jonnie Finner, RN CM inquiring if the patient's appointment for 12/08/16 can be changed to later in the week. He is going home today. Call returned and message left informing her that at this time, there are not any hospital follow up appointments available for later next week.  The patient can always call to reschedule if needed; but at this time it is best to keep this appointment

## 2016-12-02 NOTE — Progress Notes (Signed)
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 Non productive cough, + sweats but documented fevers. CT chest 11/24/16 revealed bilateral pulmonary nodules ? metastatic dz vz septic emboli. Note toxicology screen  + for above mentioned drugs  SIGNIFICANT EVENTS  Bronch not completed in full secondary to pt refusing ABX started 2/12 Remains culture neg  STUDIES:  CTA chest 2/5>>> 1. Negative for acute PE or thoracic aortic dissection. 2. Bilateral pulmonary nodules. May represent metastatic disease versus septic emboli. 3. Trace bilateral pleural effusions.  2/12 CT chest>>>New and enlarging areas of rounded consolidation with surrounding ground-glass and developing cavitation, most indicative of septic emboli.  Pertinent Labs:  TEE 2/7>> no evidence endocarditis  HIV 2/7>> NR Pct 2/7>> 0.11 ESR 2/8> 57 CRP 2/8> 29 RA 2/8> 19 ACE, GBM ab > neg   SUBJECTIVE: no sob  VITAL SIGNS: Temp:  [98.6 F (37 C)-99.5 F (37.5 C)] 98.6 F (37 C) (02/13 0950) Pulse Rate:  [87-97] 91 (02/13 0950) Resp:  [11-35] 24 (02/13 0925) BP: (119-171)/(69-105) 134/86 (02/13 0950) SpO2:  [95 %-100 %] 95 % (02/13 0925) Weight:  [118.9 kg (262 lb 3.2 oz)] 118.9 kg (262 lb 3.2 oz) (02/13 0500)  PHYSICAL EXAMINATION:  General:  No distress HEENT: jvd wnl, no lymphad Neuro: Awake and interactive, moving all ext to command , little more frustrated this am  CV: RRR, Nl S1/S2, -M/R/G PULM: CTA, slight coarse left upper LG:XQJJ, non-tender, bsx4 active  Extremities: warm/dry, no edema  Skin: no rashes or lesions Nodes- none enlarged  Recent Labs Lab 11/26/16 0436  11/28/16 0429 12/01/16 0608  NA 134* 131* 131*  K 3.3* 3.9 4.1  CL 93* 93* 94*  CO2 '27 26 24  '$ BUN 17 22* 20  CREATININE 1.24 1.50* 1.45*  GLUCOSE 124* 128* 139*    Recent Labs Lab 11/26/16 0436 11/28/16 0429 12/01/16 0608  HGB 11.9* 11.4* 10.9*  HCT 36.6* 34.0* 32.2*  WBC 13.2* 15.0* 19.1*  PLT 335 393 556*   Dg Chest 2 View  Result Date: 12/01/2016 CLINICAL DATA:  Cough and shortness of breath. Pulmonary nodular lesions. EXAM: CHEST  2 VIEW COMPARISON:  November 26, 2016 FINDINGS: Nodular opacities have become larger bilaterally compared to recent study. Largest nodular lesion is in the right upper lobe, currently measuring 3.2 x 2.3 cm. There is a new nodular lesion in the left mid lung which has an area of central cavitation. This lesion measures 2.4 x 2.2 cm. There is ill-defined opacity in the right base which is new. Heart size and pulmonary vascularity are normal. No adenopathy. No bone lesions. IMPRESSION: Several new and several enlarging nodular lesions bilaterally. Nodular lesion in the left mid lung has central cavitation. This appearance is most consistent with septic emboli. New area of apparent airspace consolidation right base, likely superimposed pneumonia. Heart size within normal limits. No adenopathy evident. These results will be called to the ordering clinician or representative by the Radiologist Assistant, and communication documented in the PACS or zVision Dashboard. Electronically Signed   By: Lowella Grip III M.D.   On: 12/01/2016 07:57   Ct Chest Wo Contrast  Result Date: 12/01/2016 CLINICAL  DATA:  Pulmonary nodules.  Cough. EXAM: CT CHEST WITHOUT CONTRAST TECHNIQUE: Multidetector CT imaging of the chest was performed following the standard protocol without IV contrast. COMPARISON:  11/24/2016. FINDINGS: Cardiovascular: Vascular structures are unremarkable. Heart size normal. No pericardial effusion. Mediastinum/Nodes: Mediastinal lymph nodes are not  enlarged by CT size criteria. Hilar regions are difficult to evaluate without IV contrast. No axillary adenopathy. Esophagus is grossly unremarkable. Lungs/Pleura: New and enlarging rounded areas of consolidation with surrounding ground-glass are seen bilaterally. Some are now cavitary, for example 2 lesions in the left upper lobe. No pleural fluid. Airway is unremarkable. Upper Abdomen: Visualized portions of the liver, adrenal glands, right kidney, spleen, pancreas, stomach and bowel are grossly unremarkable. Musculoskeletal: No worrisome lytic or sclerotic lesions. IMPRESSION: New and enlarging areas of rounded consolidation with surrounding ground-glass and developing cavitation, most indicative of septic emboli. Electronically Signed   By: Lorin Picket M.D.   On: 12/01/2016 11:06   ASSESSMENT   Principal Problem:   Chest pain   HTN (hypertension)   Pulmonary lesion bilateral with suspected emobli   Substance abuse, coacaine and marijuana   Discussion: 41yo male smoker with history of drug abuse presenting with musculoskeletal chest pain and cough.  CT chest revealed bilateral pulmonary nodules thought to be metastatic disease v septic emboli.  TEE negative for endocarditis, HIV neg, pct essentially negative. Ct repeat in house now cavitations WORSENING LESIONS on pcxr 2/12 Bronch 2/13 refusal after multiple attempts  Pulmonary nodules:  Ddx of autoimmune vs mets vs hypersensitivity reaction to cocaine (much more rare) TEE neg, still cant r/o infected endocarditis still given clinical circumstance R/o autoimmune lesions  R/o liver abscess>? Vs met  PLAN; Quant was neg , CT worsening, again concern is infective endocarditis Would get ID consult continued current abx, and will d/w ID to treat empiric and follow vs do bronch in OR under deep sedation Follow pcxr No steroids   Autoimmune w/u- results noted  - ACE 29 neg  - RF - 19  high  - ANA neg  - ESR 95 high  - CRP 29.9-->  24.7  - Anti-GBM- neg  - c-ANCA neg  - p-ANCA  - Quantiferon gold neg 2/8  updated pt   Jeremy Hood. Jeremy Mould, MD, [place] Pgr: [facility]

## 2016-12-02 NOTE — Progress Notes (Signed)
Pharmacy Antibiotic Note  Jeremy Hood is a 41 y.o. male admitted on 11/23/2016 with chest pain.  Pharmacy has been consulted for Vancomycin dosing for Worsening lesions on pcxr favors infective/bilateral areas of consolidation  ? Septic embolization . CCM noted TEE neg, can not  r/o infected endocarditis given clinical circumstance. ID consulted , Dr. Tommy Medal has discontinued Ceftriaxone and ordered pharmacy consult dosing for Unasyn. Plan for bronch today.   Plan: Unasyn 3 g IV q6h  Vancomycin continued: 750 mg IV q12h Monitor clinical status, renal function, culture Steady state vanc trough as needed.  Height: 5\' 11"  (180.3 cm) Weight: 262 lb 3.2 oz (118.9 kg) IBW/kg (Calculated) : 75.3  Adjusted body wt 101 kg  Temp (24hrs), Avg:99 F (37.2 C), Min:98.6 F (37 C), Max:99.5 F (37.5 C)   Recent Labs Lab 11/26/16 0436 11/28/16 0429 12/01/16 0608  WBC 13.2* 15.0* 19.1*  CREATININE 1.24 1.50* 1.45*    Estimated Creatinine Clearance: 88.8 mL/min (by C-G formula based on SCr of 1.45 mg/dL (H)).    No Known Allergies  Antimicrobials this admission: 2/12 Ceftriaxone>> 2/13 2/12 Vancomycin >> 2/13 Unasyn >>  Dose adjustments this admission:   Microbiology results: 2/5 BCx: neg   Thank you for allowing pharmacy to be a part of this patient's care.  Nicole Cella, RPh Clinical Pharmacist Pager: [phone] 8A-4P [phone] 4P-10P [phone] [place] [phone] 12/02/2016 11:05 AM

## 2016-12-02 NOTE — Care Management Note (Addendum)
Case Management Note  Patient Details  Name: Jeremy Hood MRN: [id number] Date of Birth: 31-Dec-1975  Subjective/Objective:     Bilateral pleural effusions, bilateral pulmonary nodules               Action/Plan: Discharge Planning: AVS reviewed:  Pt has appt arranged with [place] on 12/08/16 @ 3:45 pm. Pt can pick up meds from [facility] NCM spoke to pt and unable to complete Bronchoscopy. Contacted attending to follow up on dc.   Contacted [facility] to reschedule hospital follow up. Did not have any appts for next week. Told rep that to keep appt on 12/08/2016.    Expected Discharge Date:  12/02/16               Expected Discharge Plan:  Home/Self Care  In-House Referral:  NA  Discharge planning Services  CM Consult, Follow-up appt scheduled, [facility], Medication Assistance  Post Acute Care Choice:  NA Choice offered to:  NA  DME Arranged:  N/A DME Agency:  NA  HH Arranged:  NA HH Agency:  NA  Status of Service:  Completed, signed off  If discussed at [place] of Stay Meetings, dates discussed:    Additional Comments:  Erenest Rasher, RN 12/02/2016, 10:33 AM

## 2016-12-02 NOTE — Progress Notes (Signed)
Video bronchoscopy attempted by Dr Titus Mould, but pt kept pulling the scope out each time. Pt refused to try anymore.  Kathie Dike RRT

## 2016-12-02 NOTE — Procedures (Signed)
All pre procedure checklist followed Versed 1 mg , fent 50 mic x 2 Lido airway done prior  Attempted x 4 to place scope in nose rt nares and mouth, noted easly airway but pt pulled scope out each time Refusing procedure Will see if can do in OR  Will observe him in bronch lab x 1 hour sats 100% He never let me enter cords, did spray cords with lido x 1 No mass lesions in airway, epiglottis WNL  [name]. Titus Mould, MD, FACP Pgr: [place]. Titus Mould, MD, [place] Pgr: [facility]

## 2016-12-02 NOTE — Progress Notes (Addendum)
PROGRESS NOTE    Jeremy Hood  [id number] DOB: 1976-02-09 DOA: 11/23/2016 PCP: No PCP Per Patient   Outpatient Specialists:   Brief Narrative:  Jeremy Hood is a 41 y.o. male  pmh of tobacco abuse and uncontrolled hypertension presents emergency room with chief complaint of chest pain.  CT chest with Lung nodules, Pulm consulting, septic emboli a consideration, TEE negative, ID consulted Workup unrevealing so far.  Assessment & Plan:   Bilateral nodules now progressing to consolidation ? Septic embolization - unclear etiology, CT with progression of nodules with consolidation and early cavitation -TEE 2/7 negative for endocarditis, could this have been done after he had already embolized, adamantly denies IVDA, reports that he snorts cocaine -ID consulted d/w Dr.Van Dam -autoimmune work up negative thus far-ANA, ANCA, GBM Ab -RF mildly elevated -pulm consult appreciated, HIV negative, ACE negative,Quantiferon Tb gold negative -MRI abd without contrast notes non specific liver lesions -Bronch today -started IV Abx 2/12  CP- suspect pleuritic -due to above, also seen by cards and underwent Myoview which was felt to be low risk, but official read notes mild ant ischemia, workup down the road recommended -d dimer was elevated but CTA negative for PE-- did show nodules- - TEE Negative-denies IV drug use - see above  Low potassium -Repleted  Cocaine abuse -encouraged cessation  Tobacco abuse - Advised cessation  DVT prophylaxis: SQ Heparin  Code Status:Full Code Family Communication:wife Disposition Plan: Home pending workup    Consultants:   cards   Subjective: Feels ok, some chest pain with inspiration and cough, low grade fever overnight  Objective: Vitals:   12/02/16 0825 12/02/16 0830 12/02/16 0835 12/02/16 0840  BP: (!) 146/100 (!) 150/97 (!) 137/92 (!) 143/94  Pulse: 94 95 92 88  Resp: 14 11 14 13   Temp:      TempSrc:      SpO2: 98%  99% 99% 100%  Weight:      Height:        Intake/Output Summary (Last 24 hours) at 12/02/16 0909 Last data filed at 12/01/16 1512  Gross per 24 hour  Intake             1030 ml  Output                0 ml  Net             1030 ml   Filed Weights   11/30/16 0400 12/01/16 0523 12/02/16 0500  Weight: 117.8 kg (259 lb 9.6 oz) 117.8 kg (259 lb 9.6 oz) 118.9 kg (262 lb 3.2 oz)    Examination:  General exam: Appears calm and comfortable  Respiratory system: diminished, no wheezing Cardiovascular system: S1 & S2 heard, RRR. No JVD, murmurs, rubs, gallops or clicks. No pedal edema. Gastrointestinal system: Abdomen is nondistended, soft and nontender. No organomegaly or masses felt. Normal bowel sounds heard. Central nervous system: Alert and oriented. No focal neurological deficits. Ext: no edema    Data Reviewed: I have personally reviewed following labs and imaging studies  CBC:  Recent Labs Lab 11/26/16 0436 11/28/16 0429 12/01/16 0608  WBC 13.2* 15.0* 19.1*  NEUTROABS  --   --  15.1*  HGB 11.9* 11.4* 10.9*  HCT 36.6* 34.0* 32.2*  MCV 85.7 84.8 85.0  PLT 335 393 [id number]*   Basic Metabolic Panel:  Recent Labs Lab 11/26/16 0436 11/28/16 0429 12/01/16 0608  NA 134* 131* 131*  K 3.3* 3.9 4.1  CL 93* 93* 94*  CO2 27 26 24   GLUCOSE 124* 128* 139*  BUN 17 22* 20  CREATININE 1.24 1.50* 1.45*  CALCIUM 9.1 9.1 9.4   GFR: Estimated Creatinine Clearance: 88.8 mL/min (by C-G formula based on SCr of 1.45 mg/dL (H)). Liver Function Tests: No results for input(s): AST, ALT, ALKPHOS, BILITOT, PROT, ALBUMIN in the last 168 hours. No results for input(s): LIPASE, AMYLASE in the last 168 hours. No results for input(s): AMMONIA in the last 168 hours. Coagulation Profile:  Recent Labs Lab 12/01/16 1101  INR 1.20   Cardiac Enzymes: No results for input(s): CKTOTAL, CKMB, CKMBINDEX, TROPONINI in the last 168 hours. BNP (last 3 results) No results for input(s): PROBNP in the  last 8760 hours. HbA1C: No results for input(s): HGBA1C in the last 72 hours. CBG: No results for input(s): GLUCAP in the last 168 hours. Lipid Profile: No results for input(s): CHOL, HDL, LDLCALC, TRIG, CHOLHDL, LDLDIRECT in the last 72 hours. Thyroid Function Tests: No results for input(s): TSH, T4TOTAL, FREET4, T3FREE, THYROIDAB in the last 72 hours. Anemia Panel: No results for input(s): VITAMINB12, FOLATE, FERRITIN, TIBC, IRON, RETICCTPCT in the last 72 hours. Urine analysis: No results found for: COLORURINE, APPEARANCEUR, [place], [place], [place], [place], [place], [place], [place], UROBILINOGEN, NITRITE, LEUKOCYTESUR   ) Recent Results (from the past 240 hour(s))  Culture, blood (Routine X 2) w Reflex to ID Panel     Status: None   Collection Time: 11/24/16  1:55 PM  Result Value Ref Range Status   Specimen Description BLOOD LEFT HAND  Final   Special Requests BOTTLES DRAWN AEROBIC AND ANAEROBIC 10CC  Final   Culture NO GROWTH 5 DAYS  Final   Report Status 11/29/2016 FINAL  Final  Culture, blood (Routine X 2) w Reflex to ID Panel     Status: None   Collection Time: 11/24/16  2:02 PM  Result Value Ref Range Status   Specimen Description BLOOD RIGHT ANTECUBITAL  Final   Special Requests BOTTLES DRAWN AEROBIC AND ANAEROBIC 10CC  Final   Culture NO GROWTH 5 DAYS  Final   Report Status 11/29/2016 FINAL  Final      Anti-infectives    Start     Dose/Rate Route Frequency Ordered Stop   12/01/16 2200  [MAR Hold]  vancomycin (VANCOCIN) IVPB 750 mg/150 ml premix     (MAR Hold since 12/02/16 0755)   750 mg 150 mL/hr over 60 Minutes Intravenous Every 12 hours 12/01/16 0855     12/01/16 1000  vancomycin (VANCOCIN) 2,000 mg in sodium chloride 0.9 % 500 mL IVPB     2,000 mg 250 mL/hr over 120 Minutes Intravenous  Once 12/01/16 0855 12/01/16 1300   12/01/16 0930  [MAR Hold]  cefTRIAXone (ROCEPHIN) 2 g in dextrose 5 % 50 mL IVPB     (MAR Hold since 12/02/16 0755)   2 g 100  mL/hr over 30 Minutes Intravenous Every 24 hours 12/01/16 0834         Radiology Studies: Dg Chest 2 View  Result Date: 12/01/2016 CLINICAL DATA:  Cough and shortness of breath. Pulmonary nodular lesions. EXAM: CHEST  2 VIEW COMPARISON:  November 26, 2016 FINDINGS: Nodular opacities have become larger bilaterally compared to recent study. Largest nodular lesion is in the right upper lobe, currently measuring 3.2 x 2.3 cm. There is a new nodular lesion in the left mid lung which has an area of central cavitation. This lesion measures 2.4 x 2.2 cm. There is ill-defined opacity in the right base  which is new. Heart size and pulmonary vascularity are normal. No adenopathy. No bone lesions. IMPRESSION: Several new and several enlarging nodular lesions bilaterally. Nodular lesion in the left mid lung has central cavitation. This appearance is most consistent with septic emboli. New area of apparent airspace consolidation right base, likely superimposed pneumonia. Heart size within normal limits. No adenopathy evident. These results will be called to the ordering clinician or representative by the Radiologist Assistant, and communication documented in the PACS or zVision Dashboard. Electronically Signed   By: Lowella Grip III M.D.   On: 12/01/2016 07:57   Ct Chest Wo Contrast  Result Date: 12/01/2016 CLINICAL DATA:  Pulmonary nodules.  Cough. EXAM: CT CHEST WITHOUT CONTRAST TECHNIQUE: Multidetector CT imaging of the chest was performed following the standard protocol without IV contrast. COMPARISON:  11/24/2016. FINDINGS: Cardiovascular: Vascular structures are unremarkable. Heart size normal. No pericardial effusion. Mediastinum/Nodes: Mediastinal lymph nodes are not enlarged by CT size criteria. Hilar regions are difficult to evaluate without IV contrast. No axillary adenopathy. Esophagus is grossly unremarkable. Lungs/Pleura: New and enlarging rounded areas of consolidation with surrounding ground-glass  are seen bilaterally. Some are now cavitary, for example 2 lesions in the left upper lobe. No pleural fluid. Airway is unremarkable. Upper Abdomen: Visualized portions of the liver, adrenal glands, right kidney, spleen, pancreas, stomach and bowel are grossly unremarkable. Musculoskeletal: No worrisome lytic or sclerotic lesions. IMPRESSION: New and enlarging areas of rounded consolidation with surrounding ground-glass and developing cavitation, most indicative of septic emboli. Electronically Signed   By: Lorin Picket M.D.   On: 12/01/2016 11:06        Scheduled Meds: . [MAR Hold] aspirin EC  325 mg Oral Daily  . butamben-tetracaine-benzocaine  1 spray Topical Once  . [MAR Hold] cefTRIAXone (ROCEPHIN)  IV  2 g Intravenous Q24H  . [MAR Hold] chlorthalidone  50 mg Oral Daily  . [MAR Hold] heparin  5,000 Units Subcutaneous Q8H  . lidocaine  1 application Topical Once  . [MAR Hold] pneumococcal 23 valent vaccine  0.5 mL Intramuscular Tomorrow-1000  . [MAR Hold] potassium chloride  40 mEq Oral BID  . [MAR Hold] regadenoson  0.4 mg Intravenous Once  . [MAR Hold] vancomycin  750 mg Intravenous Q12H   Continuous Infusions: . sodium chloride 10 mL/hr at 12/02/16 0842     LOS: 7 days    Time spent: 25 min    Annaliyah Willig, MD [facility] Pager [phone]  If 7PM-7AM, please contact night-coverage [email or web address] Password [email or web address] 12/02/2016, 9:09 AM

## 2016-12-02 NOTE — Consult Note (Signed)
Date of Admission:  11/23/2016  Date of Consult:  12/02/2016  Reason for Consult: Cavitating multinodular lung disease Referring Physician: Dr. Broadus John   HPI: Jeremy Hood is an 41 y.o. male with history of poorly controlled hypertension, prior adenoidectomy who presented to the emerge department April 4 with chest pain. During his admission he had a CT scan which showed diffuse pulmonary nodules in both lungs that were thought to possibly be due to either an autoimmune condition versus sarcoidosis versus malignancy versus septic embolization from the heart. He underwent a transesophageal echocardiogram which failed to show any evidence of vegetations. His blood cultures from February 5 have remained sterile.  Since then however his lung disease has progressed with now cavitation in several of the nodules. Radiology is suspicious again for potential progression of presumed septic emboli though again we have not proven that he has an endovascular source for these vegetations. Perhaps he embolized lesions previously on his valve.  Patient denies history of exposure to anyone with tuberculosis. He has not been homeless he has not been incarcerated. He has traveled largely in the [place] throughout his life also has traveled out to [place] and [place] including via desert areas. Does have a history of fishing but does not typically wait until water when he does this he has dogs as pets but no birds. Does not work on a farm.   Talk to the patient he does have a loose tooth in his upper mouth that has been bothering him quite a bit and I wonder if he may have an odontogenic infection that may spread into the bloodstream and seated the valve and then spread to the lungs.  Certainly it is also possible he has some other potential cause of his cavitating palmar area lesion such as an endemic mycosis, or an autoimmune condition such as sarcoidosis.  He was to have a bronchoscopy today  but apparently he pulled out the endoscope during the procedure.  I ordered a Panorex to evaluate his teeth after examining him and there is indeed a lucency concerning for infection here.   Past Medical History:  Diagnosis Date  . Chest pain 11/2016  . Hypertension     Past Surgical History:  Procedure Laterality Date  . ADENOIDECTOMY    . TEE WITHOUT CARDIOVERSION N/A 11/26/2016   Procedure: TRANSESOPHAGEAL ECHOCARDIOGRAM (TEE);  Surgeon: Thayer Headings, MD;  Location: [facility] ENDOSCOPY;  Service: Cardiovascular;  Laterality: N/A;    Social History:  reports that he has been smoking Cigarettes.  He has a 10.00 pack-year smoking history. He has never used smokeless tobacco. He reports that he drinks alcohol. He reports that he uses drugs, including Marijuana.   Family History  Problem Relation Age of Onset  . Hypertension Mother   . Diabetes Mother     No Known Allergies   Medications: I have reviewed patients current medications as documented in Epic Anti-infectives    Start     Dose/Rate Route Frequency Ordered Stop   12/02/16 1200  Ampicillin-Sulbactam (UNASYN) 3 g in sodium chloride 0.9 % 100 mL IVPB     3 g 200 mL/hr over 30 Minutes Intravenous Every 6 hours 12/02/16 1100     12/01/16 2200  vancomycin (VANCOCIN) IVPB 750 mg/150 ml premix     750 mg 150 mL/hr over 60 Minutes Intravenous Every 12 hours 12/01/16 0855     12/01/16 1000  vancomycin (VANCOCIN) 2,000 mg in sodium chloride 0.9 %  500 mL IVPB     2,000 mg 250 mL/hr over 120 Minutes Intravenous  Once 12/01/16 0855 12/01/16 1300   12/01/16 0930  cefTRIAXone (ROCEPHIN) 2 g in dextrose 5 % 50 mL IVPB  Status:  Discontinued     2 g 100 mL/hr over 30 Minutes Intravenous Every 24 hours 12/01/16 0834 12/02/16 1044         ROS:as in HPI otherwise remainder of 12 point Review of Systems is negative  Blood pressure 125/80, pulse 87, temperature 98.9 F (37.2 C), temperature source Oral, resp. rate (!) 24, height 5'  11" (1.803 m), weight 262 lb 3.2 oz (118.9 kg), SpO2 100 %. General: Alert and awake, oriented x3, not in any acute distress. HEENT: anicteric sclera,  EOMI, oropharynx clear and without exudate, he has a loose tooth on the upper palate on the right side Cardiovascular: Tachycardic rate, normal r,  no murmur rubs or gallops Pulmonary: Fairly clear to auscultation bilaterally, no wheezing, rales or rhonchi Gastrointestinal: soft nontender, nondistended, normal bowel sounds, Musculoskeletal: no  clubbing or edema noted bilaterally Skin, soft tissue: no rashes, positive tattoos Neuro: nonfocal, strength and sensation intact   Results for orders placed or performed during the hospital encounter of 11/23/16 (from the past 48 hour(s))  CBC with Differential/Platelet     Status: Abnormal   Collection Time: 12/01/16  6:08 AM  Result Value Ref Range   WBC 19.1 (H) 4.0 - 10.5 K/uL    Comment: REPEATED TO VERIFY   RBC 3.79 (L) 4.22 - 5.81 MIL/uL   Hemoglobin 10.9 (L) 13.0 - 17.0 g/dL    Comment: REPEATED TO VERIFY   HCT 32.2 (L) 39.0 - 52.0 %   MCV 85.0 78.0 - 100.0 fL   MCH 28.8 26.0 - 34.0 pg   MCHC 33.9 30.0 - 36.0 g/dL   RDW 14.3 11.5 - 15.5 %   Platelets 556 (H) 150 - 400 K/uL    Comment: REPEATED TO VERIFY   Neutrophils Relative % 79 %   Lymphocytes Relative 12 %   Monocytes Relative 6 %   Eosinophils Relative 3 %   Basophils Relative 0 %   Neutro Abs 15.1 (H) 1.7 - 7.7 K/uL   Lymphs Abs 2.3 0.7 - 4.0 K/uL   Monocytes Absolute 1.1 (H) 0.1 - 1.0 K/uL   Eosinophils Absolute 0.6 0.0 - 0.7 K/uL   Basophils Absolute 0.0 0.0 - 0.1 K/uL   WBC Morphology ATYPICAL LYMPHOCYTES     Comment: RARE  Basic metabolic panel     Status: Abnormal   Collection Time: 12/01/16  6:08 AM  Result Value Ref Range   Sodium 131 (L) 135 - 145 mmol/L   Potassium 4.1 3.5 - 5.1 mmol/L   Chloride 94 (L) 101 - 111 mmol/L   CO2 24 22 - 32 mmol/L   Glucose, Bld 139 (H) 65 - 99 mg/dL   BUN 20 6 - 20 mg/dL    Creatinine, Ser 1.45 (H) 0.61 - 1.24 mg/dL   Calcium 9.4 8.9 - 10.3 mg/dL   GFR calc non Af Amer 59 (L) >60 mL/min   GFR calc Af Amer >60 >60 mL/min    Comment: (NOTE) The eGFR has been calculated using the CKD EPI equation. This calculation has not been validated in all clinical situations. eGFR's persistently <60 mL/min signify possible Chronic Kidney Disease.    Anion gap 13 5 - 15  C-reactive protein     Status: Abnormal   Collection Time: 12/01/16  6:08 AM  Result Value Ref Range   CRP 24.7 (H) <1.0 mg/dL  Sedimentation rate     Status: Abnormal   Collection Time: 12/01/16  6:08 AM  Result Value Ref Range   Sed Rate 95 (H) 0 - 16 mm/hr  Protime-INR     Status: Abnormal   Collection Time: 12/01/16 11:01 AM  Result Value Ref Range   Prothrombin Time 15.3 (H) 11.4 - 15.2 seconds   INR 1.20   APTT     Status: Abnormal   Collection Time: 12/01/16 11:01 AM  Result Value Ref Range   aPTT 37 (H) 24 - 36 seconds    Comment:        IF BASELINE aPTT IS ELEVATED, SUGGEST PATIENT RISK ASSESSMENT BE USED TO DETERMINE APPROPRIATE ANTICOAGULANT THERAPY.    _0 (sdes,specrequest,cult,reptstatus)   ) Recent Results (from the past 720 hour(s))  Culture, blood (Routine X 2) w Reflex to ID Panel     Status: None   Collection Time: 11/24/16  1:55 PM  Result Value Ref Range Status   Specimen Description BLOOD LEFT HAND  Final   Special Requests BOTTLES DRAWN AEROBIC AND ANAEROBIC 10CC  Final   Culture NO GROWTH 5 DAYS  Final   Report Status 11/29/2016 FINAL  Final  Culture, blood (Routine X 2) w Reflex to ID Panel     Status: None   Collection Time: 11/24/16  2:02 PM  Result Value Ref Range Status   Specimen Description BLOOD RIGHT ANTECUBITAL  Final   Special Requests BOTTLES DRAWN AEROBIC AND ANAEROBIC 10CC  Final   Culture NO GROWTH 5 DAYS  Final   Report Status 11/29/2016 FINAL  Final     Impression/Recommendation  Principal Problem:   Chest pain Active  Problems:   Costochondritis   HTN (hypertension)   Lung mass   Substance abuse, coacaine and marijuana    Cough   Septic embolism (HCC)   Tobacco abuse   Cocaine abuse   Liver lesion   Abnormal chest x-ray with multiple lung nodules   Jeremy Hood is a 41 y.o. male with  Cavitating pulmonary nodules that are progressed since admission.  #1 multifocal cavitating pulmonary nodules:  Differential still includes infectious causes such as septic embolization from right-sided endocarditis. If this were the case that he must embolized his vegetation since his transesophageal echocardiogram is negative. He does have a potential odontogenic source with his lucency around his tooth that is loose the right upper mandible.  Tuberculosis does not seem likely but an endemic mycosis such as Cryptococcus Blastomyces or histoplasmosis is possible. Nocardiat is also possible.  Malignancies is possible though does not seem as likely an autoimmune condition such as sarcoidosis also remains in the differential.  I will change antibiotics to vancomycin and Unasyn for now.  I would recommend consulting dental regarding his loose tooth and potential dental abscess.  Certainly if he fails to improve on his current antibiotics then he will need an intervention to try to obtain cultures such as bronchoscopy with BAL but if this is still not definitive he may then require an open lung biopsy. In the interim I'll send off a serum good coccal antigen urine histoplasma and Blastomyces antigens as well as serum antibodies to cocci or 80s by Complement fixation an immune diffusion  I spent greater than 80 minutes with the patient including greater than 50% of time in face to face counsel of the patient regarding his multifocal cavitating lung disease and  in coordination of his care with Primary team and Pulmonary.   12/02/2016, 3:49 PM   Thank you so much for this interesting consult  [place] for  [place] [phone] (pager) [phone] (office) 12/02/2016, 3:49 PM  Rhina Brackett Dam 12/02/2016, 3:49 PM

## 2016-12-03 ENCOUNTER — Encounter (HOSPITAL_COMMUNITY): Payer: Self-pay | Admitting: Internal Medicine

## 2016-12-03 DIAGNOSIS — L818 Other specified disorders of pigmentation: Secondary | ICD-10-CM

## 2016-12-03 DIAGNOSIS — J9601 Acute respiratory failure with hypoxia: Secondary | ICD-10-CM

## 2016-12-03 DIAGNOSIS — K047 Periapical abscess without sinus: Secondary | ICD-10-CM

## 2016-12-03 DIAGNOSIS — R000 Tachycardia, unspecified: Secondary | ICD-10-CM

## 2016-12-03 LAB — HCG, SERUM, QUALITATIVE: PREG SERUM: NEGATIVE

## 2016-12-03 LAB — MISC LABCORP TEST (SEND OUT): Labcorp test code: 9985

## 2016-12-03 LAB — LACTATE DEHYDROGENASE: LDH: 183 U/L (ref 98–192)

## 2016-12-03 LAB — CRYPTOCOCCAL ANTIGEN: CRYPTO AG: NEGATIVE

## 2016-12-03 NOTE — Progress Notes (Signed)
[facility]                                                                              Patient Demographics  Jeremy Hood, is a 41 y.o. male, DOB - 11-28-1975, ST:[id number]  Admit date - 11/23/2016   Admitting Physician Jeremy Mocha, MD  Outpatient Primary MD for the patient is No PCP Per Patient  Outpatient specialists:   LOS - 8  days    Chief Complaint  Patient presents with  . Chest Pain       Brief summary   Jeremy Elsberry Huntleyis a 41 y.o.malepmhof tobacco abuse and uncontrolled hypertension presented to the emergency room with chief complaint of chest pain that was nonradiating and increased with coughing and movement. He also reported nonproductive cough with sweating and fevers. CT chest 11/24/16 revealed bilateral pulmonary nodules ? metastatic disease versus a septic emboli. Toxicology screen was positive for cocaine and marijuana.  CT chest with Lung nodules, Pulm consulting, septic emboli a consideration. Infectious disease was consulted. He underwent TEE on 2/7 which was negative for any vegetation.   Assessment & Plan    Principal Problem:  Multifocal nodules now progressing to  cavitary consolidations ? Septic embolization - unclear etiology, CT with progression of nodules with consolidation and early cavitation -TEE 2/7 negative for endocarditis however denies IVDA, reports that he snorts cocaine -autoimmune work up negative thus far-ANA, ANCA, GBM Ab -RF mildly elevated -pulm consult appreciated, HIV negative, ACE negative,Quantiferon Tb gold negative -MRI abd without contrast notes non specific liver lesions - Underwent unsuccessful bronch yesterday - Orthopantogram 2/13 showed apical lucency along the right upper posterior-most tooth/molar, discussed with Dr Tommie Raymond, will see patient today - discussed with Dr. Drucilla Schmidt, possibly dental abscess may have caused cavitary nodules and recommended rarrow antibiotics to Unasyn IV at this  point.   Active problems Chest pain- suspect pleuritic - due to above, also seen by cardiology and underwent Myoview which was felt to be low risk, but official read notes mild ant ischemia. Per cardiology doubt this is angina  - d dimer was elevated but CTA negative for PE-- did show nodules - TEE Negative-denies IV drug use   Low potassium -Repleted  Cocaine abuse -encouraged cessation  Tobacco abuse - Advised cessation  Code Status full code  DVT Prophylaxis:   heparin  Family Communication: Discussed in detail with the patient, all imaging results, lab results explained to the patient and wife  Disposition Plan:   Time Spent in minutes   79minutes  Procedures:  Unsuccessful bronchoscopy TEE  Consultants:   Cardiology Infectious disease Pulmonary critical care  Antimicrobials:   IV Unasyn   Medications  Scheduled Meds: . ampicillin-sulbactam (UNASYN) IV  3 g Intravenous Q6H  . aspirin EC  325 mg Oral Daily  . chlorthalidone  50 mg Oral Daily  . heparin  5,000 Units Subcutaneous Q8H  . pneumococcal 23 valent vaccine  0.5 mL Intramuscular Tomorrow-1000  . potassium chloride  40 mEq Oral BID  . regadenoson  0.4 mg Intravenous Once   Continuous Infusions: . sodium chloride 10 mL/hr at 12/02/16 0842   PRN  Meds:.acetaminophen, alum & mag hydroxide-simeth, chlorpheniramine-HYDROcodone, cyclobenzaprine, gi cocktail, guaiFENesin-dextromethorphan, hydrALAZINE, lip balm, morphine injection, MUSCLE RUB, nitroGLYCERIN, ondansetron (ZOFRAN) IV, phenol, polyvinyl alcohol, sodium chloride, traMADol, zolpidem   Antibiotics   Anti-infectives    Start     Dose/Rate Route Frequency Ordered Stop   12/02/16 1200  Ampicillin-Sulbactam (UNASYN) 3 g in sodium chloride 0.9 % 100 mL IVPB     3 g 200 mL/hr over 30 Minutes Intravenous Every 6 hours 12/02/16 1100     12/01/16 2200  vancomycin (VANCOCIN) IVPB 750 mg/150 ml premix  Status:  Discontinued     750 mg 150 mL/hr  over 60 Minutes Intravenous Every 12 hours 12/01/16 0855 12/03/16 0843   12/01/16 1000  vancomycin (VANCOCIN) 2,000 mg in sodium chloride 0.9 % 500 mL IVPB     2,000 mg 250 mL/hr over 120 Minutes Intravenous  Once 12/01/16 0855 12/01/16 1300   12/01/16 0930  cefTRIAXone (ROCEPHIN) 2 g in dextrose 5 % 50 mL IVPB  Status:  Discontinued     2 g 100 mL/hr over 30 Minutes Intravenous Every 24 hours 12/01/16 0834 12/02/16 1044        Subjective:   Jeremy Hood was seen and examined today. Still complaining of right-sided lower chest pain with inspiration and coughing. No fevers this morning. States he has chronic issues with his teeth and has not seen a dentist in a long time. Patient denies dizziness,  abdominal pain, N/V/D/C, new weakness, numbess, tingling. No acute events overnight.    Objective:   Vitals:   12/02/16 0950 12/02/16 1445 12/02/16 2210 12/03/16 0550  BP: 134/86 125/80 (!) 153/92 127/79  Pulse: 91 87 92 88  Resp:      Temp: 98.6 F (37 C) 98.9 F (37.2 C) 98.4 F (36.9 C) 98.5 F (36.9 C)  TempSrc: Oral Oral Oral Oral  SpO2:  100% 100% 100%  Weight:    117.9 kg (259 lb 14.4 oz)  Height:        Intake/Output Summary (Last 24 hours) at 12/03/16 1229 Last data filed at 12/03/16 [id number]  Gross per 24 hour  Intake             1770 ml  Output                0 ml  Net             1770 ml     Wt Readings from Last 3 Encounters:  12/03/16 117.9 kg (259 lb 14.4 oz)  04/15/16 122.5 kg (270 lb)     Exam  General: Alert and oriented x 3, NAD  HEENT:  PERRLA, EOMI, Anicteric Sclera, mucous membranes moist.   Neck: Supple, no JVD, no masses  Cardiovascular: S1 S2 auscultated, no rubs, murmurs or gallops. Regular rate and rhythm.  Respiratory: Decreased breath sounds at the bases  Gastrointestinal: Soft, nontender, nondistended, + bowel sounds  Ext: no cyanosis clubbing or edema  Neuro: AAOx3, Cr N's II- XII. Strength 5/5 upper and lower extremities  bilaterally  Skin: No rashes  Psych: Normal affect and demeanor, alert and oriented x3    Data Reviewed:  I have personally reviewed following labs and imaging studies  Micro Results Recent Results (from the past 240 hour(s))  Culture, blood (Routine X 2) w Reflex to ID Panel     Status: None   Collection Time: 11/24/16  1:55 PM  Result Value Ref Range Status   Specimen Description BLOOD LEFT HAND  Final  Special Requests BOTTLES DRAWN AEROBIC AND ANAEROBIC 10CC  Final   Culture NO GROWTH 5 DAYS  Final   Report Status 11/29/2016 FINAL  Final  Culture, blood (Routine X 2) w Reflex to ID Panel     Status: None   Collection Time: 11/24/16  2:02 PM  Result Value Ref Range Status   Specimen Description BLOOD RIGHT ANTECUBITAL  Final   Special Requests BOTTLES DRAWN AEROBIC AND ANAEROBIC 10CC  Final   Culture NO GROWTH 5 DAYS  Final   Report Status 11/29/2016 FINAL  Final    Radiology Reports Dg Orthopantogram  Result Date: 12/02/2016 CLINICAL DATA:  Loosened tooth several weeks ago. Currently no symptoms. EXAM: ORTHOPANTOGRAM/PANORAMIC COMPARISON:  No recent prior . FINDINGS: Apical lucency noted along the right upper posterior most tooth/molar. Infection cannot be excluded. No other focal abnormality identified. Further evaluation with maxillofacial CT can be obtained as needed . IMPRESSION: Apical lucency noted along the right upper posterior most tooth/molar. Infection cannot be excluded. No other focal abnormality identified. Electronically Signed   By: Marcello Moores  Register   On: 12/02/2016 13:43   Dg Chest 2 View  Result Date: 12/01/2016 CLINICAL DATA:  Cough and shortness of breath. Pulmonary nodular lesions. EXAM: CHEST  2 VIEW COMPARISON:  November 26, 2016 FINDINGS: Nodular opacities have become larger bilaterally compared to recent study. Largest nodular lesion is in the right upper lobe, currently measuring 3.2 x 2.3 cm. There is a new nodular lesion in the left mid lung which has  an area of central cavitation. This lesion measures 2.4 x 2.2 cm. There is ill-defined opacity in the right base which is new. Heart size and pulmonary vascularity are normal. No adenopathy. No bone lesions. IMPRESSION: Several new and several enlarging nodular lesions bilaterally. Nodular lesion in the left mid lung has central cavitation. This appearance is most consistent with septic emboli. New area of apparent airspace consolidation right base, likely superimposed pneumonia. Heart size within normal limits. No adenopathy evident. These results will be called to the ordering clinician or representative by the Radiologist Assistant, and communication documented in the PACS or zVision Dashboard. Electronically Signed   By: Lowella Grip III M.D.   On: 12/01/2016 07:57   Dg Chest 2 View  Result Date: 11/23/2016 CLINICAL DATA:  41 year old male with its sharp central chest pain onset this morning. Pleuritic pain. Initial encounter. EXAM: CHEST  2 VIEW COMPARISON:  Chest radiographs 09/25/2007. FINDINGS: Lower lung volumes with crowding of lung markings and extend to a shin of cardiac size. Other mediastinal contours are within normal limits. Visualized tracheal air column is within normal limits. No pneumothorax or pleural effusion. No pulmonary edema suspected. No confluent pulmonary opacity. Negative visible bowel gas pattern. No acute osseous abnormality identified. IMPRESSION: Low lung volumes.  No acute cardiopulmonary abnormality. Electronically Signed   By: Genevie Ann M.D.   On: 11/23/2016 11:28   Ct Chest Wo Contrast  Result Date: 12/01/2016 CLINICAL DATA:  Pulmonary nodules.  Cough. EXAM: CT CHEST WITHOUT CONTRAST TECHNIQUE: Multidetector CT imaging of the chest was performed following the standard protocol without IV contrast. COMPARISON:  11/24/2016. FINDINGS: Cardiovascular: Vascular structures are unremarkable. Heart size normal. No pericardial effusion. Mediastinum/Nodes: Mediastinal lymph  nodes are not enlarged by CT size criteria. Hilar regions are difficult to evaluate without IV contrast. No axillary adenopathy. Esophagus is grossly unremarkable. Lungs/Pleura: New and enlarging rounded areas of consolidation with surrounding ground-glass are seen bilaterally. Some are now cavitary, for example 2 lesions  in the left upper lobe. No pleural fluid. Airway is unremarkable. Upper Abdomen: Visualized portions of the liver, adrenal glands, right kidney, spleen, pancreas, stomach and bowel are grossly unremarkable. Musculoskeletal: No worrisome lytic or sclerotic lesions. IMPRESSION: New and enlarging areas of rounded consolidation with surrounding ground-glass and developing cavitation, most indicative of septic emboli. Electronically Signed   By: Lorin Picket M.D.   On: 12/01/2016 11:06   Ct Angio Chest Pe W Or Wo Contrast  Result Date: 11/24/2016 CLINICAL DATA:  Pt c/o left sided CP since last night, SOB Hx of HTN EXAM: CT ANGIOGRAPHY CHEST WITH CONTRAST TECHNIQUE: Multidetector CT imaging of the chest was performed using the standard protocol during bolus administration of intravenous contrast. Multiplanar CT image reconstructions and MIPs were obtained to evaluate the vascular anatomy. CONTRAST:  85cc isovue 370 COMPARISON:  None. FINDINGS: Cardiovascular: Left arm IV contrast administration. Innominate vein and SVC patent. Right atrium and right ventricle nondilated. Satisfactory opacification of pulmonary arteries noted, and there is no evidence of pulmonary emboli. Patent bilateral pulmonary veins drain into the left atrium. Adequate contrast opacification of the thoracic aorta with no evidence of dissection, aneurysm, or stenosis. There is bovine variant brachiocephalic arch anatomy without proximal stenosis. Mediastinum/Nodes: No enlarged mediastinal, hilar, or axillary lymph nodes. Thyroid gland, trachea, and esophagus demonstrate no significant findings. Lungs/Pleura: Bilateral pulmonary  nodules involving all lobes. The margins of most of the nodules are somewhat hazy and indistinct. None demonstrate calcification or definite cavitation. Representative lesions include 14 mm in the left apex, image 21/607 ; 12 mm pleural-based nodule, anterior right upper lobe image 43/607. There are patchy airspace opacities posteriorly in both lung bases probably a combination of pulmonary nodules and adjacent atelectasis/consolidation. Trace bilateral pleural effusions. No pneumothorax. Upper Abdomen: No acute abnormality. Musculoskeletal: Anterior vertebral endplate spurring at multiple levels in the mid and lower thoracic spine. Review of the MIP images confirms the above findings. IMPRESSION: 1. Negative for acute PE or thoracic aortic dissection. 2. Bilateral pulmonary nodules. May represent metastatic disease versus septic emboli. 3. Trace bilateral pleural effusions. Electronically Signed   By: Lucrezia Europe M.D.   On: 11/24/2016 11:45   Ct Abdomen Pelvis W Contrast  Result Date: 11/27/2016 CLINICAL DATA:  Patient with multiple pulmonary nodules. Metastatic disease not excluded. EXAM: CT ABDOMEN AND PELVIS WITH CONTRAST TECHNIQUE: Multidetector CT imaging of the abdomen and pelvis was performed using the standard protocol following bolus administration of intravenous contrast. CONTRAST:  100 ISOVUE-300 IOPAMIDOL (ISOVUE-300) INJECTION 61% COMPARISON:  CT chest 11/24/2016. FINDINGS: Lower chest: Normal heart size. Multiple bilateral lower lobe pulmonary nodules are demonstrated measuring up to 3.2 cm within the subpleural left lower lobe. No pleural effusion. Hepatobiliary: Liver is normal in size and contour. Within the central aspect of the liver there is a 4.2 x 4.6 cm (image 22; series 2) low-attenuation lesion. Additionally within the lateral left hepatic lobe there is a 1.7 cm low-attenuation lesion. Within the peripheral right hepatic lobe there is a 1.0 cm low-attenuation lesion (image 18; series 2).  Gallbladder is unremarkable. No intrahepatic or extrahepatic biliary ductal dilatation. Pancreas: Unremarkable Spleen: Unremarkable Adrenals/Urinary Tract: The adrenal glands are normal. Kidneys enhance symmetrically with contrast. No hydronephrosis. Too small to characterize low-attenuation lesion interpolar region left kidney. Urinary bladder is unremarkable. Stomach/Bowel: Oral contrast material to the level of the rectum. Normal appendix. No abnormal bowel wall thickening or evidence for bowel obstruction. Normal morphology of the stomach. Vascular/Lymphatic: Peripheral calcified atherosclerotic plaque. No retroperitoneal lymphadenopathy.  Reproductive: Prostate unremarkable. Other: None. Musculoskeletal: Lumbar spine degenerative changes. No aggressive or acute appearing osseous lesions. IMPRESSION: Multiple nodules are demonstrated within the lower lobes bilaterally, many are subpleural in location. Findings raise the possibility of septic emboli. Metastatic disease is not excluded. Multiple low-attenuation lesions within the liver. The largest measures 4.5 cm. These are indeterminate in etiology and need definitive characterization with pre and post contrast-enhanced MRI in the non-acute setting. Electronically Signed   By: Lovey Newcomer M.D.   On: 11/27/2016 21:48   Mr Liver Wo Conrtast  Result Date: 11/29/2016 CLINICAL DATA:  41 year old male inpatient with multiple pulmonary nodules on chest CT performed for dyspnea and chest pain, presents for evaluation of multiple liver lesions on CT abdomen/pelvis study. EXAM: MRI ABDOMEN WITHOUT CONTRAST TECHNIQUE: Multiplanar multisequence MR imaging was performed without the administration of intravenous contrast. COMPARISON:  11/27/2016 CT abdomen/ pelvis. FINDINGS: Motion degraded scan. Examination was discontinued prior to completion at the patient's request. No T1 weighted imaging or contrast-enhanced imaging was obtained. Lower chest: Re- demonstrated are  multiple scattered poorly marginated nodular opacities of various sizes throughout both lungs, not definitely changed from the recent chest CT, measuring up to the 3.2 cm in the posterior lower left lung (series 7/image 1). Hepatobiliary: Normal liver size and configuration. There are three similar-appearing mildly T2 hyperintense (with the T2 hyperintensity intermediate between bile and the spleen) liver masses scattered throughout the liver, measuring 6.0 x 4.2 cm in the caudate lobe (series 12/ image 18), 1.8 x 1.5 cm in the lateral segment left liver lobe (series 12/ image 16) and 0.9 x 0.9 cm in the segment 8 right liver lobe (series 12/ image 11). Normal gallbladder with no cholelithiasis. No biliary ductal dilatation. Common bile duct diameter 3 mm. No choledocholithiasis. Pancreas: No pancreatic mass or duct dilation.  No pancreas divisum. Spleen: Normal size. No mass. Adrenals/Urinary Tract: Normal adrenals. No hydronephrosis. Normal size kidneys. There a few scattered tiny simple appearing renal cysts in both kidneys measuring up to 1.0 cm in the lower left kidney, incompletely characterized on this limited noncontrast MRI. No T2 hypointense renal lesions. Stomach/Bowel: Grossly normal stomach. Visualized small and large bowel is normal caliber, with no bowel wall thickening. Vascular/Lymphatic: Normal caliber abdominal aorta. No pathologically enlarged lymph nodes in the abdomen. Other: No abdominal ascites or focal fluid collection. Musculoskeletal: No aggressive appearing focal osseous lesions. IMPRESSION: 1. Motion degraded incomplete noncontrast MRI study, which was discontinued prior to completion at the patient's request. 2. Re- demonstration of multiple scattered poorly marginated nodular opacities of various sizes throughout both lungs, not definitely changed from the recent chest CT. The differential diagnosis for these nodular opacities is broad and includes infection, vasculitis or metastatic  disease. 3. Three similar-appearing mildly T2 hyperintense liver masses scattered throughout the liver, which are incompletely characterized on this limited noncontrast MRI study and remain indeterminate. The differential includes benign causes such as hemangiomas, with malignancy not excluded. A repeat MRI abdomen without and with IV contrast is recommended on a short-term basis when clinically feasible. 4. Small scattered simple appearing renal cysts, incompletely characterized. Electronically Signed   By: Ilona Sorrel M.D.   On: 11/29/2016 09:05   Nm Myocar Multi W/spect W/wall Motion / Ef  Result Date: 11/24/2016 CLINICAL DATA:  Chest pain, shortness of Breath EXAM: MYOCARDIAL IMAGING WITH SPECT (REST AND PHARMACOLOGIC-STRESS) GATED LEFT VENTRICULAR WALL MOTION STUDY LEFT VENTRICULAR EJECTION FRACTION TECHNIQUE: Standard myocardial SPECT imaging was performed after resting intravenous injection of 10  mCi Tc-7m tetrofosmin. Subsequently, intravenous infusion of Lexiscan was performed under the supervision of the Cardiology staff. At peak effect of the drug, 30 mCi Tc-55m tetrofosmin was injected intravenously and standard myocardial SPECT imaging was performed. Quantitative gated imaging was also performed to evaluate left ventricular wall motion, and estimate left ventricular ejection fraction. COMPARISON:  None. FINDINGS: Perfusion: There is decreased activity noted anteriorly on the stress images, normal on the rest images, concerning for anterior wall ischemia Wall Motion: Mild generalized hypokinesia Left Ventricular Ejection Fraction: 43 % End diastolic volume [id number] ml End systolic volume 92 ml IMPRESSION: 1. Area of mild reversibility anteriorly concerning for ischemia. 2. Mild generalized hypokinesia. 3. Left ventricular ejection fraction 43% 4. Non invasive risk stratification*: High *2012 Appropriate Use Criteria for Coronary Revascularization Focused Update: J Am Coll Cardiol. [id number].  http://[email or web address]?[email or web address] Electronically Signed   By: Rolm Baptise M.D.   On: 11/24/2016 16:22   Dg Chest Port 1 View  Result Date: 11/26/2016 CLINICAL DATA:  Patient admitted for chest pain and cough 11/23/2016. Symptoms continue. EXAM: PORTABLE CHEST 1 VIEW COMPARISON:  PA and lateral chest and CT chest 11/23/2016. FINDINGS: There is cardiomegaly without edema. Nodular opacities seen on prior CT scan are not as well demonstrated although two opacities are identified in the right upper lobe. No pneumothorax or pleural effusion. IMPRESSION: Nodular opacities are better demonstrated on prior CT although 2 opacities are seen in the right upper lobe. Cardiomegaly without edema. Electronically Signed   By: Inge Rise M.D.   On: 11/26/2016 11:24    Lab Data:  CBC:  Recent Labs Lab 11/28/16 0429 12/01/16 0608  WBC 15.0* 19.1*  NEUTROABS  --  15.1*  HGB 11.4* 10.9*  HCT 34.0* 32.2*  MCV 84.8 85.0  PLT 393 [id number]*   Basic Metabolic Panel:  Recent Labs Lab 11/28/16 0429 12/01/16 0608  NA 131* 131*  K 3.9 4.1  CL 93* 94*  CO2 26 24  GLUCOSE 128* 139*  BUN 22* 20  CREATININE 1.50* 1.45*  CALCIUM 9.1 9.4   GFR: Estimated Creatinine Clearance: 88.4 mL/min (by C-G formula based on SCr of 1.45 mg/dL (H)). Liver Function Tests: No results for input(s): AST, ALT, ALKPHOS, BILITOT, PROT, ALBUMIN in the last 168 hours. No results for input(s): LIPASE, AMYLASE in the last 168 hours. No results for input(s): AMMONIA in the last 168 hours. Coagulation Profile:  Recent Labs Lab 12/01/16 1101  INR 1.20   Cardiac Enzymes: No results for input(s): CKTOTAL, CKMB, CKMBINDEX, TROPONINI in the last 168 hours. BNP (last 3 results) No results for input(s): PROBNP in the last 8760 hours. HbA1C: No results for input(s): HGBA1C in the last 72 hours. CBG: No results for input(s): GLUCAP in the last 168 hours. Lipid Profile: No results for input(s): CHOL,  HDL, LDLCALC, TRIG, CHOLHDL, LDLDIRECT in the last 72 hours. Thyroid Function Tests: No results for input(s): TSH, T4TOTAL, FREET4, T3FREE, THYROIDAB in the last 72 hours. Anemia Panel: No results for input(s): VITAMINB12, FOLATE, FERRITIN, TIBC, IRON, RETICCTPCT in the last 72 hours. Urine analysis: No results found for: COLORURINE, APPEARANCEUR, LABSPEC, PHURINE, GLUCOSEU, HGBUR, BILIRUBINUR, KETONESUR, PROTEINUR, UROBILINOGEN, NITRITE, Corliss Skains M.D. [facility] 12/03/2016, 12:29 PM  Pager: [phone] Between 7am to 7pm - call Pager - [phone]  After 7pm go to [email or web address] - password [email or web address]  Call night coverage person covering after 7pm

## 2016-12-03 NOTE — Progress Notes (Signed)
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 Non productive cough, + sweats but documented fevers.  CT chest 11/24/16 revealed bilateral pulmonary nodules ? metastatic dz vz septic emboli.  TEE negative for vegetation.    SUBJECTIVE:  Pt reports subjective fever overnight, sweats.  Denies chest pain, SOB  VITAL SIGNS: Temp:  [98.4 F (36.9 C)-98.9 F (37.2 C)] 98.5 F (36.9 C) (02/14 0550) Pulse Rate:  [87-92] 88 (02/14 0550) BP: (125-153)/(79-92) 127/79 (02/14 0550) SpO2:  [100 %] 100 % (02/14 0550) Weight:  [259 lb 14.4 oz (117.9 kg)] 259 lb 14.4 oz (117.9 kg) (02/14 0550)  PHYSICAL EXAMINATION:  General:  Obese male in NAD HEENT: MM pink/moist PSY: normal mood Neuro: AAOx4, speech clear, MAE CV: s1s2 rrr, no m/r/g PULM: even/non-labored, lungs bilaterally clear PY:KDXI, non-tender, bsx4 active  Extremities: warm/dry, no edema  Skin: no rashes or lesions   Recent Labs Lab 11/28/16 0429 12/01/16 0608  NA 131* 131*  K 3.9 4.1  CL 93* 94*  CO2 26 24  BUN 22* 20  CREATININE 1.50* 1.45*  GLUCOSE 128* 139*    Recent Labs Lab 11/28/16 0429 12/01/16 0608  HGB 11.4* 10.9*  HCT 34.0* 32.2*  WBC 15.0* 19.1*  PLT 393 556*   Dg Orthopantogram  Result Date: 12/02/2016 CLINICAL DATA:  Loosened tooth several weeks ago. Currently no symptoms. EXAM: ORTHOPANTOGRAM/PANORAMIC COMPARISON:  No recent prior . FINDINGS: Apical lucency noted along the right upper posterior most tooth/molar. Infection cannot be excluded. No other focal abnormality identified. Further evaluation with maxillofacial CT can be obtained as needed . IMPRESSION:  Apical lucency noted along the right upper posterior most tooth/molar. Infection cannot be excluded. No other focal abnormality identified. Electronically Signed   By: Marcello Moores  Register   On: 12/02/2016 13:43   SIGNIFICANT EVENTS  2/04  Admit  2/05  CT chest worrisome for septic emboli 2/07  PCCM consulted for eval  2/12  Attempted FOB, pt did not tolerate   STUDIES:  CTA chest 2/5 >> Negative for acute PE or thoracic aortic dissection. Bilateral pulmonary nodules. May represent metastatic disease versus septic emboli. Trace bilateral pleural effusions. 2/12 CT chest >> New and enlarging areas of rounded consolidation with surrounding ground-glass and developing cavitation, most indicative of septic emboli.  Pertinent Labs:  TEE 2/7>> no evidence endocarditis  HIV 2/7>> NR Pct 2/7>> 0.11 ESR 2/8> 57 CRP 2/8> 29 RA 2/8> 19 ACE, GBM ab > neg  Autoimmune w/u- results noted  - ACE 29 neg  - RF - 19  high  - ANA neg  - ESR 95 high  - CRP 29.9--> 24.7  - Anti-GBM- neg  - c-ANCA neg  - p-ANCA  - Quantiferon gold neg 2/8   ASSESSMENT   Chest Pain  Pulmonary Nodules - bilateral nodules, ddx includes high suspicion for infection, autoimmune disease, metastatic disease, hypersensitivity reaction to cocaine.  TEE negative but high clinical suspicion for infective endocarditis. Quantiferon gold negative.     Rule Out Septic Emboli  Polysubstance Abuse  Low Attenuation Liver Lesions - metastatic vs infectious  Discussion: 41 yo male smoker with history of drug abuse presenting with  musculoskeletal chest pain and cough.  CT chest revealed bilateral pulmonary nodules thought to be metastatic disease v septic emboli.  TEE negative for endocarditis, HIV neg, pct essentially negative. Repeat CT demonstrated areas of cavitation.  Concern for worsening cavitary lesions 2/12 on CXR.  FOB attempted 2/13 but pt unable to tolerate / pulled scope out with multiple attempts.      Plan: Agree  with panorex of teeth May need sedation in OR for washings / lymph node biopsy  ID following, appreciate input  Continue abx Trend CXR  Pulmonary hygiene  Assess AFP, B-HCG and LDH  PCCM will follow along with you.   Noe Gens, NP-C [facility] Pgr: [phone] or if no answer [phone] 12/03/2016, 11:06 AM  STAFF NOTE: I, Merrie Roof, MD FACP have personally reviewed patient's available data, including medical history, events of note, physical examination and test results as part of my evaluation. I have discussed with resident/NP and other care providers such as pharmacist, RN and RRT. In addition, I personally evaluated patient and elicited key findings of: awake, walking in room, no distress, clear lungs, no fevers sig since ABX started, panarex noted pos findings and c/w septic emboli, CT c/w septic emboli and TEE may have been FN or veg already travelled aff Tric valve, ID will treat empiric endocarditis , pt need repeeat CT chest in 2-4 weeks and pulm office follow up to ensure CT resolving lesions with ABX, risk / benefit ratio to persue OR bronch is not favored, agree wit ID, will sign off  [name]. Titus Mould, MD, [place] Pgr: [facility] 12/03/2016 3:41 PM

## 2016-12-03 NOTE — Progress Notes (Signed)
Subjective: No new complaints   Antibiotics:  Anti-infectives    Start     Dose/Rate Route Frequency Ordered Stop   12/02/16 1200  Ampicillin-Sulbactam (UNASYN) 3 g in sodium chloride 0.9 % 100 mL IVPB     3 g 200 mL/hr over 30 Minutes Intravenous Every 6 hours 12/02/16 1100     12/01/16 2200  vancomycin (VANCOCIN) IVPB 750 mg/150 ml premix  Status:  Discontinued     750 mg 150 mL/hr over 60 Minutes Intravenous Every 12 hours 12/01/16 0855 12/03/16 0843   12/01/16 1000  vancomycin (VANCOCIN) 2,000 mg in sodium chloride 0.9 % 500 mL IVPB     2,000 mg 250 mL/hr over 120 Minutes Intravenous  Once 12/01/16 0855 12/01/16 1300   12/01/16 0930  cefTRIAXone (ROCEPHIN) 2 g in dextrose 5 % 50 mL IVPB  Status:  Discontinued     2 g 100 mL/hr over 30 Minutes Intravenous Every 24 hours 12/01/16 0834 12/02/16 1044      Medications: Scheduled Meds: . ampicillin-sulbactam (UNASYN) IV  3 g Intravenous Q6H  . aspirin EC  325 mg Oral Daily  . chlorthalidone  50 mg Oral Daily  . heparin  5,000 Units Subcutaneous Q8H  . pneumococcal 23 valent vaccine  0.5 mL Intramuscular Tomorrow-1000  . potassium chloride  40 mEq Oral BID  . regadenoson  0.4 mg Intravenous Once   Continuous Infusions: . sodium chloride 10 mL/hr at 12/02/16 0842   PRN Meds:.acetaminophen, alum & mag hydroxide-simeth, chlorpheniramine-HYDROcodone, cyclobenzaprine, gi cocktail, guaiFENesin-dextromethorphan, hydrALAZINE, lip balm, morphine injection, MUSCLE RUB, nitroGLYCERIN, ondansetron (ZOFRAN) IV, phenol, polyvinyl alcohol, sodium chloride, traMADol, zolpidem    Objective: Weight change: -2 lb 4.8 oz (-1.043 kg)  Intake/Output Summary (Last 24 hours) at 12/03/16 1148 Last data filed at 12/03/16 [id number]  Gross per 24 hour  Intake             2550 ml  Output                0 ml  Net             2550 ml   Blood pressure 127/79, pulse 88, temperature 98.5 F (36.9 C), temperature source Oral, resp. rate (!) 24,  height 5\' 11"  (1.803 m), weight 259 lb 14.4 oz (117.9 kg), SpO2 100 %. Temp:  [98.4 F (36.9 C)-98.9 F (37.2 C)] 98.5 F (36.9 C) (02/14 0550) Pulse Rate:  [87-92] 88 (02/14 0550) BP: (125-153)/(79-92) 127/79 (02/14 0550) SpO2:  [100 %] 100 % (02/14 0550) Weight:  [259 lb 14.4 oz (117.9 kg)] 259 lb 14.4 oz (117.9 kg) (02/14 0550)  Physical Exam: General: Alert and awake, oriented x3, not in any acute distress. HEENT: anicteric sclera,  EOMI, oropharynx clear and without exudate, he has a loose tooth on the upper palate on the right side Cardiovascular: Tachycardic rate, normal r,  no murmur rubs or gallops Pulmonary: clear to auscultation bilaterally, no wheezing, rales or rhonchi Gastrointestinal: soft nontender, nondistended, normal bowel sounds, Musculoskeletal: no  clubbing or edema noted bilaterally Skin, soft tissue: no rashes, positive tattoos Neuro: nonfocal, strength and sensation intact  CBC:  CBC Latest Ref Rng & Units 12/01/2016 11/28/2016 11/26/2016  WBC 4.0 - 10.5 K/uL 19.1(H) 15.0(H) 13.2(H)  Hemoglobin 13.0 - 17.0 g/dL 10.9(L) 11.4(L) 11.9(L)  Hematocrit 39.0 - 52.0 % 32.2(L) 34.0(L) 36.6(L)  Platelets 150 - 400 K/uL 556(H) 393 335     BMET  Recent Labs  12/01/16 0608  NA 131*  K 4.1  CL 94*  CO2 24  GLUCOSE 139*  BUN 20  CREATININE 1.45*  CALCIUM 9.4     Liver Panel  No results for input(s): PROT, ALBUMIN, AST, ALT, ALKPHOS, BILITOT, BILIDIR, IBILI in the last 72 hours.     Sedimentation Rate  Recent Labs  12/01/16 0608  ESRSEDRATE 95*   C-Reactive Protein  Recent Labs  12/01/16 0608  CRP 24.7*    Micro Results: Recent Results (from the past 720 hour(s))  Culture, blood (Routine X 2) w Reflex to ID Panel     Status: None   Collection Time: 11/24/16  1:55 PM  Result Value Ref Range Status   Specimen Description BLOOD LEFT HAND  Final   Special Requests BOTTLES DRAWN AEROBIC AND ANAEROBIC 10CC  Final   Culture NO GROWTH 5 DAYS   Final   Report Status 11/29/2016 FINAL  Final  Culture, blood (Routine X 2) w Reflex to ID Panel     Status: None   Collection Time: 11/24/16  2:02 PM  Result Value Ref Range Status   Specimen Description BLOOD RIGHT ANTECUBITAL  Final   Special Requests BOTTLES DRAWN AEROBIC AND ANAEROBIC 10CC  Final   Culture NO GROWTH 5 DAYS  Final   Report Status 11/29/2016 FINAL  Final    Studies/Results: Dg Orthopantogram  Result Date: 12/02/2016 CLINICAL DATA:  Loosened tooth several weeks ago. Currently no symptoms. EXAM: ORTHOPANTOGRAM/PANORAMIC COMPARISON:  No recent prior . FINDINGS: Apical lucency noted along the right upper posterior most tooth/molar. Infection cannot be excluded. No other focal abnormality identified. Further evaluation with maxillofacial CT can be obtained as needed . IMPRESSION: Apical lucency noted along the right upper posterior most tooth/molar. Infection cannot be excluded. No other focal abnormality identified. Electronically Signed   By: [place]   On: 12/02/2016 13:43      Assessment/Plan:  INTERVAL HISTORY: panorex showed lucency, Dr. Enrique Sack to see   Principal Problem:   Chest pain Active Problems:   Costochondritis   HTN (hypertension)   Lung mass   Substance abuse, coacaine and marijuana    Cough   Septic embolism (HCC)   Tobacco abuse   Cocaine abuse   Liver lesion   Abnormal chest x-ray with multiple lung nodules   Loosening of tooth   Cavitary lung disease   Dental abscess    Jeremy Hood is a 41 y.o. male with  Cavitating pulmonary nodules that are progressed since admission.  #1 Multi focal cavitating pulmonary nodules:  Certainly one possible explanation would be that his dental abscess seeded his blood and caused right sided endocarditis that a vegetation on the right side of his heart valve is only embolized to the lungs causing his nodules that if since cavitated.  Minor staining is Dr. Enrique Sack is to see the patient  today and hopefully he can address the dental abscess and remove this as a nidus of infection. I suspect the tooth will need to be pulled  Operating under the assumption that this is an odontogenic infection I will narrow the patient to Unasyn IV.  If the patient continues to do well after addressing his dental infection weekend go with a protracted IV course of Unasyn for another 4 weeks.  At that point in time over repeat CT of his lungs and if there is improvement then I would feel fairly confident that we have got the diagnosis correct.  If his lung nodules however have failed to  improve with empiric antibiotics then he will need to have a bronchoscopy and/or open lung biopsy to pin down what the pathology is in his lungs and such tissue will be needed to send for AFB fungal cultures, and also looking for nocardia as well as other bacterial species.      LOS: 8 days   Alcide Evener 12/03/2016, 11:48 AM

## 2016-12-04 ENCOUNTER — Encounter (HOSPITAL_COMMUNITY): Payer: Self-pay | Admitting: Dentistry

## 2016-12-04 ENCOUNTER — Inpatient Hospital Stay (HOSPITAL_COMMUNITY): Payer: Self-pay

## 2016-12-04 DIAGNOSIS — K029 Dental caries, unspecified: Secondary | ICD-10-CM

## 2016-12-04 DIAGNOSIS — K06010 Localized gingival recession, unspecified: Secondary | ICD-10-CM

## 2016-12-04 DIAGNOSIS — K036 Deposits [accretions] on teeth: Secondary | ICD-10-CM

## 2016-12-04 DIAGNOSIS — K0530 Chronic periodontitis, unspecified: Secondary | ICD-10-CM

## 2016-12-04 DIAGNOSIS — K083 Retained dental root: Secondary | ICD-10-CM

## 2016-12-04 DIAGNOSIS — M264 Malocclusion, unspecified: Secondary | ICD-10-CM

## 2016-12-04 DIAGNOSIS — K045 Chronic apical periodontitis: Secondary | ICD-10-CM

## 2016-12-04 LAB — HEPATITIS C ANTIBODY (REFLEX): HCV Ab: <0.1 s/co ratio (ref 0.0–0.9)

## 2016-12-04 LAB — HEPATITIS B SURFACE ANTIGEN: Hepatitis B Surface Ag: NEGATIVE

## 2016-12-04 LAB — HISTOPLASMA ANTIGEN, URINE: Histoplasma Antigen, urine: <0.5 (ref <0.5)

## 2016-12-04 LAB — AFP TUMOR MARKER: AFP-Tumor Marker: 0.9 ng/mL (ref 0.0–8.3)

## 2016-12-04 LAB — HCV COMMENT:

## 2016-12-04 MED ORDER — METRONIDAZOLE 500 MG PO TABS
500.0000 mg | ORAL_TABLET | Freq: Three times a day (TID) | ORAL | Status: DC
  Administered 2016-12-04 – 2016-12-05 (×4): 500 mg via ORAL
  Filled 2016-12-04 (×4): qty 1

## 2016-12-04 MED ORDER — SODIUM CHLORIDE 0.9% FLUSH: 10.0000 mL | INTRAVENOUS | Status: DC | PRN

## 2016-12-04 MED ORDER — DEXTROSE 5 % IV SOLN
2.0000 g | INTRAVENOUS | Status: DC
  Administered 2016-12-04: 2 g via INTRAVENOUS
  Filled 2016-12-04 (×2): qty 2

## 2016-12-04 NOTE — Progress Notes (Signed)
[facility]  New pt for [facility] this hospital admission for [facility]. Confirmed with Dr. Tommy Medal [facility] will support IV ABX at home starting with first home dose on 12-05-16.  Pt will DC on [place] 2 [employer]ams VI Q 24 hours and will have first dose here at hospital tonight at [place] will be private pay for services and states he can afford IV ABX and supplies at rate of $20.16 per day, but unable to afford cost of weekly RN visit. I am working with Eden Lathe, RN with [facility] to arrange for pt to have weekly labs and PICC care provided there.  Pt has first appt on 12-08-16. Pt in agreement to plan. Call to Dr. Tana Coast to advise of plan. She aware.   Will finalize plan in AM when [name] is back to work for [facility] support.  If patient discharges after hours, please call [phone].   Larry Sierras 12/04/2016, 3:50 PM
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
## 2016-12-04 NOTE — Progress Notes (Signed)
Subjective: No new complaints   Antibiotics:  Anti-infectives    Start     Dose/Rate Route Frequency Ordered Stop   12/04/16 1500  cefTRIAXone (ROCEPHIN) 2 g in dextrose 5 % 50 mL IVPB     2 g 100 mL/hr over 30 Minutes Intravenous Every 24 hours 12/04/16 1354     12/04/16 1400  metroNIDAZOLE (FLAGYL) tablet 500 mg     500 mg Oral Every 8 hours 12/04/16 1354     12/02/16 1200  Ampicillin-Sulbactam (UNASYN) 3 g in sodium chloride 0.9 % 100 mL IVPB  Status:  Discontinued     3 g 200 mL/hr over 30 Minutes Intravenous Every 6 hours 12/02/16 1100 12/04/16 1354   12/01/16 2200  vancomycin (VANCOCIN) IVPB 750 mg/150 ml premix  Status:  Discontinued     750 mg 150 mL/hr over 60 Minutes Intravenous Every 12 hours 12/01/16 0855 12/03/16 0843   12/01/16 1000  vancomycin (VANCOCIN) 2,000 mg in sodium chloride 0.9 % 500 mL IVPB     2,000 mg 250 mL/hr over 120 Minutes Intravenous  Once 12/01/16 0855 12/01/16 1300   12/01/16 0930  cefTRIAXone (ROCEPHIN) 2 g in dextrose 5 % 50 mL IVPB  Status:  Discontinued     2 g 100 mL/hr over 30 Minutes Intravenous Every 24 hours 12/01/16 0834 12/02/16 1044      Medications: Scheduled Meds: . aspirin EC  325 mg Oral Daily  . cefTRIAXone (ROCEPHIN)  IV  2 g Intravenous Q24H  . chlorthalidone  50 mg Oral Daily  . heparin  5,000 Units Subcutaneous Q8H  . metroNIDAZOLE  500 mg Oral Q8H  . pneumococcal 23 valent vaccine  0.5 mL Intramuscular Tomorrow-1000  . potassium chloride  40 mEq Oral BID  . regadenoson  0.4 mg Intravenous Once   Continuous Infusions: . sodium chloride 10 mL/hr at 12/02/16 0842   PRN Meds:.acetaminophen, alum & mag hydroxide-simeth, chlorpheniramine-HYDROcodone, cyclobenzaprine, gi cocktail, guaiFENesin-dextromethorphan, hydrALAZINE, lip balm, morphine injection, MUSCLE RUB, nitroGLYCERIN, ondansetron (ZOFRAN) IV, phenol, polyvinyl alcohol, sodium chloride, traMADol, zolpidem    Objective: Weight change:  No intake or  output data in the 24 hours ending 12/04/16 1612 Blood pressure 135/70, pulse 91, temperature 98.3 F (36.8 C), temperature source Oral, resp. rate (!) 24, height 5\' 11"  (1.803 m), weight 259 lb 14.4 oz (117.9 kg), SpO2 100 %. Temp:  [97.9 F (36.6 C)-98.3 F (36.8 C)] 98.3 F (36.8 C) (02/15 1445) Pulse Rate:  [91-94] 91 (02/15 1445) BP: (135-141)/(70) 135/70 (02/15 1445) SpO2:  [100 %] 100 % (02/15 1445)  Physical Exam: General: Alert and awake, oriented x3, not in any acute distress. HEENT: anicteric sclera,  EOMI, oropharynx clear and without exudate, he has a loose tooth on the upper palate on the right side Cardiovascular: Tachycardic rate, normal r,  no murmur rubs or gallops Pulmonary: clear to auscultation bilaterally, no wheezing, rales or rhonchi Gastrointestinal: soft nontender, nondistended, normal bowel sounds, Musculoskeletal: no  clubbing or edema noted bilaterally Skin, soft tissue: no rashes, positive tattoos Neuro: nonfocal, strength and sensation intact  CBC:  CBC Latest Ref Rng & Units 12/01/2016 11/28/2016 11/26/2016  WBC 4.0 - 10.5 K/uL 19.1(H) 15.0(H) 13.2(H)  Hemoglobin 13.0 - 17.0 g/dL 10.9(L) 11.4(L) 11.9(L)  Hematocrit 39.0 - 52.0 % 32.2(L) 34.0(L) 36.6(L)  Platelets 150 - 400 K/uL 556(H) 393 335     BMET No results for input(s): NA, K, CL, CO2, GLUCOSE, BUN, CREATININE, CALCIUM in the last 72  hours.   Liver Panel  No results for input(s): PROT, ALBUMIN, AST, ALT, ALKPHOS, BILITOT, BILIDIR, IBILI in the last 72 hours.     Sedimentation Rate No results for input(s): ESRSEDRATE in the last 72 hours. C-Reactive Protein No results for input(s): CRP in the last 72 hours.  Micro Results: Recent Results (from the past 720 hour(s))  Culture, blood (Routine X 2) w Reflex to ID Panel     Status: None   Collection Time: 11/24/16  1:55 PM  Result Value Ref Range Status   Specimen Description BLOOD LEFT HAND  Final   Special Requests BOTTLES DRAWN  AEROBIC AND ANAEROBIC 10CC  Final   Culture NO GROWTH 5 DAYS  Final   Report Status 11/29/2016 FINAL  Final  Culture, blood (Routine X 2) w Reflex to ID Panel     Status: None   Collection Time: 11/24/16  2:02 PM  Result Value Ref Range Status   Specimen Description BLOOD RIGHT ANTECUBITAL  Final   Special Requests BOTTLES DRAWN AEROBIC AND ANAEROBIC 10CC  Final   Culture NO GROWTH 5 DAYS  Final   Report Status 11/29/2016 FINAL  Final    Studies/Results: Dg Chest 2 View  Result Date: 12/04/2016 CLINICAL DATA:  Acute respiratory failure with hypoxia EXAM: CHEST  2 VIEW COMPARISON:  Chest x-ray and chest CT scan of December 01, 2016 FINDINGS: The lungs are well-expanded. Again demonstrated are multiple soft tissue density in cavitary appearing masses in the lungs. These are slightly smaller today. There is no pleural effusion. The heart is top-normal in size. The pulmonary vascularity is not engorged. The trachea is midline. The bony thorax exhibits no acute abnormality. IMPRESSION: Mild interval decrease in the size of the multiple solid and cavitary pulmonary nodules consistent with response to therapy. Continued follow-up chest radiographs are recommended to assure complete resolution. Electronically Signed   By: David  Martinique M.D.   On: 12/04/2016 07:36      Assessment/Plan:  INTERVAL HISTORY:  Dr. Enrique Sack has seen pt   Principal Problem:   Chest pain Active Problems:   Costochondritis   HTN (hypertension)   Lung mass   Substance abuse, coacaine and marijuana    Cough   Septic embolism ([facility])   Tobacco abuse   Cocaine abuse   Liver lesion   Abnormal chest x-ray with multiple lung nodules   Loosening of tooth   Cavitary lung disease   Dental abscess   Acute respiratory failure with hypoxia ([facility])    Jeremy Hood is a 41 y.o. male with  Cavitating pulmonary nodules that are progressed since admission.  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  I do think the dental infection could be origin I do think he needs this to be taken care of by dental and/or oral surgery. I greatly appreciate Dr.:Kulinksi  seeing the patient   Will change to rocephin 2 grams daily for ease of dosing  + oral flagyl 500mg  TID with no etoh ingestion  Will treat for at least 4 weeks and make sure we have a repeat r repeat CT of his lungs and if there is improvement then I would feel fairly confident that we have got the diagnosis correct.  If his lung  nodules however have failed to improve with empiric antibiotics then he will need to have a bronchoscopy and/or open lung biopsy to pin down what the pathology is in his lungs and such tissue will be needed to send for AFB fungal cultures, and also looking for nocardia as well as other bacterial species.  Diagnosis: Septic emboli to the lungs  Culture Result: None  No Known Allergies  Discharge antibiotics: Ceftriaxone 2 g IV daily plus oral metronidazole 500 mg 3 times daily   Duration: 4 weeks End Date:  March 11th, 2018   [facility] Care Per Protocol:  Labs weekly while on IV antibiotics: x__ CBC with differential  _x_ CMP   _x_ Please pull PIC at completion of IV antibiotics __ Please leave PIC in place until doctor has seen patient or been notified  Fax weekly labs to [phone]  Clinic Follow Up Appt:  Next 3 weeks certainly prior to stopping IV abx  I'll sign off for now please call with further questions.        LOS: 9 days   Alcide Evener 12/04/2016, 4:12 PM

## 2016-12-04 NOTE — Progress Notes (Signed)
[facility]                                                                              Patient Demographics  Jeremy Hood, is a 41 y.o. male, DOB - 10/24/1975, [id number]  Admit date - 11/23/2016   Admitting Physician Elwin Mocha, MD  Outpatient Primary MD for the patient is No PCP Per Patient  Outpatient specialists:   LOS - 9  days    Chief Complaint  Patient presents with  . Chest Pain       Brief summary   Jeremy Kritzer Huntleyis a 41 y.o.malepmhof tobacco abuse and uncontrolled hypertension presented to the emergency room with chief complaint of chest pain that was nonradiating and increased with coughing and movement. He also reported nonproductive cough with sweating and fevers. CT chest 11/24/16 revealed bilateral pulmonary nodules ? metastatic disease versus a septic emboli. Toxicology screen was positive for cocaine and marijuana.  CT chest with Lung nodules, Pulm consulting, septic emboli a consideration. Infectious disease was consulted. He underwent TEE on 2/7 which was negative for any vegetation.   Assessment & Plan    Principal Problem:  Multifocal nodules now progressing to  cavitary consolidations ? Septic embolization - unclear etiology, CT with progression of nodules with consolidation and early cavitation - TEE 2/7 negative for endocarditis however denies IVDA, reports that he snorts cocaine - autoimmune work up negative thus far-ANA, ANCA, GBM Ab - RF mildly elevated - pulm consult appreciated, HIV negative, ACE negative,Quantiferon Tb gold negative - MRI abd without contrast notes non specific liver lesions - Underwent unsuccessful bronch on 2/13 - Orthopantogram 2/13 showed apical lucency along the right upper posterior-most tooth/molar, d/w with Dr Tommie Raymond 2/14, evaluating patient today, will likely need tooth extraction   - Antibiotics narrowed down to Unasyn IV. Per ID, will need 4 weeks of IV Unasyn, question if  patient will need PICC line, has a polysubstance abuse history although patient denies IV drug history. Plan to repeat CT scan chest after 4 weeks, if no improvement will need bronch at that time with biopsies   Active problems Chest pain- suspect pleuritic - due to above, also seen by cardiology and underwent Myoview which was felt to be low risk, but official read notes mild ant ischemia. Per cardiology doubt this is angina  - d dimer was elevated but CTA negative for PE-- did show nodules - TEE Negative-denies IV drug use  Cocaine abuse -encouraged cessation  Tobacco abuse - Advised cessation  Code Status full code  DVT Prophylaxis:   heparin  Family Communication: Discussed in detail with the patient, all imaging results, lab results explained to the patient   Disposition Plan:   Time Spent in minutes   62minutes  Procedures:  Unsuccessful bronchoscopy TEE  Consultants:   Cardiology Infectious disease Pulmonary critical care  Antimicrobials:   IV Unasyn   Medications  Scheduled Meds: . ampicillin-sulbactam (UNASYN) IV  3 g Intravenous Q6H  . aspirin EC  325 mg Oral Daily  . chlorthalidone  50 mg Oral Daily  . heparin  5,000 Units Subcutaneous Q8H  . pneumococcal 23 valent  vaccine  0.5 mL Intramuscular Tomorrow-1000  . potassium chloride  40 mEq Oral BID  . regadenoson  0.4 mg Intravenous Once   Continuous Infusions: . sodium chloride 10 mL/hr at 12/02/16 0842   PRN Meds:.acetaminophen, alum & mag hydroxide-simeth, chlorpheniramine-HYDROcodone, cyclobenzaprine, gi cocktail, guaiFENesin-dextromethorphan, hydrALAZINE, lip balm, morphine injection, MUSCLE RUB, nitroGLYCERIN, ondansetron (ZOFRAN) IV, phenol, polyvinyl alcohol, sodium chloride, traMADol, zolpidem   Antibiotics   Anti-infectives    Start     Dose/Rate Route Frequency Ordered Stop   12/02/16 1200  Ampicillin-Sulbactam (UNASYN) 3 g in sodium chloride 0.9 % 100 mL IVPB     3 g 200 mL/hr over  30 Minutes Intravenous Every 6 hours 12/02/16 1100     12/01/16 2200  vancomycin (VANCOCIN) IVPB 750 mg/150 ml premix  Status:  Discontinued     750 mg 150 mL/hr over 60 Minutes Intravenous Every 12 hours 12/01/16 0855 12/03/16 0843   12/01/16 1000  vancomycin (VANCOCIN) 2,000 mg in sodium chloride 0.9 % 500 mL IVPB     2,000 mg 250 mL/hr over 120 Minutes Intravenous  Once 12/01/16 0855 12/01/16 1300   12/01/16 0930  cefTRIAXone (ROCEPHIN) 2 g in dextrose 5 % 50 mL IVPB  Status:  Discontinued     2 g 100 mL/hr over 30 Minutes Intravenous Every 24 hours 12/01/16 0834 12/02/16 1044        Subjective:   [name] was seen and examined today. Feeling better today, still has off-and-on right-sided lower chest pain, pleuritic. No fevers this morning.  Patient denies dizziness,  abdominal pain, N/V/D/C, new weakness, numbess, tingling. No acute events overnight.    Objective:   Vitals:   12/02/16 1445 12/02/16 2210 12/03/16 0550 12/03/16 2200  BP: 125/80 (!) 153/92 127/79 (!) 141/70  Pulse: 87 92 88 94  Resp:      Temp: 98.9 F (37.2 C) 98.4 F (36.9 C) 98.5 F (36.9 C) 97.9 F (36.6 C)  TempSrc: Oral Oral Oral Oral  SpO2: 100% 100% 100% 100%  Weight:   117.9 kg (259 lb 14.4 oz)   Height:       No intake or output data in the 24 hours ending 12/04/16 1236   Wt Readings from Last 3 Encounters:  12/03/16 117.9 kg (259 lb 14.4 oz)  04/15/16 122.5 kg (270 lb)     Exam  General: Alert and oriented x 3, NAD  HEENT:    Neck:   Cardiovascular: S1 S2 auscultated, no rubs, murmurs or gallops. Regular rate and rhythm.  Respiratory: Decreased breath sounds at the bases  Gastrointestinal: Soft, nontender, nondistended, + bowel sounds  Ext: no cyanosis clubbing or edema  Neuro: No new deficits  Skin: No rashes  Psych: Normal affect and demeanor, alert and oriented x3    Data Reviewed:  I have personally reviewed following labs and imaging studies  Micro  Results Recent Results (from the past 240 hour(s))  Culture, blood (Routine X 2) w Reflex to ID Panel     Status: None   Collection Time: 11/24/16  1:55 PM  Result Value Ref Range Status   Specimen Description BLOOD LEFT HAND  Final   Special Requests BOTTLES DRAWN AEROBIC AND ANAEROBIC 10CC  Final   Culture NO GROWTH 5 DAYS  Final   Report Status 11/29/2016 FINAL  Final  Culture, blood (Routine X 2) w Reflex to ID Panel     Status: None   Collection Time: 11/24/16  2:02 PM  Result Value Ref Range  Status   Specimen Description BLOOD RIGHT ANTECUBITAL  Final   Special Requests BOTTLES DRAWN AEROBIC AND ANAEROBIC 10CC  Final   Culture NO GROWTH 5 DAYS  Final   Report Status 11/29/2016 FINAL  Final    Radiology Reports Dg Orthopantogram  Result Date: 12/02/2016 CLINICAL DATA:  Loosened tooth several weeks ago. Currently no symptoms. EXAM: ORTHOPANTOGRAM/PANORAMIC COMPARISON:  No recent prior . FINDINGS: Apical lucency noted along the right upper posterior most tooth/molar. Infection cannot be excluded. No other focal abnormality identified. Further evaluation with maxillofacial CT can be obtained as needed . IMPRESSION: Apical lucency noted along the right upper posterior most tooth/molar. Infection cannot be excluded. No other focal abnormality identified. Electronically Signed   By: Marcello Moores  Register   On: 12/02/2016 13:43   Dg Chest 2 View  Result Date: 12/04/2016 CLINICAL DATA:  Acute respiratory failure with hypoxia EXAM: CHEST  2 VIEW COMPARISON:  Chest x-ray and chest CT scan of December 01, 2016 FINDINGS: The lungs are well-expanded. Again demonstrated are multiple soft tissue density in cavitary appearing masses in the lungs. These are slightly smaller today. There is no pleural effusion. The heart is top-normal in size. The pulmonary vascularity is not engorged. The trachea is midline. The bony thorax exhibits no acute abnormality. IMPRESSION: Mild interval decrease in the size of the  multiple solid and cavitary pulmonary nodules consistent with response to therapy. Continued follow-up chest radiographs are recommended to assure complete resolution. Electronically Signed   By: David  Martinique M.D.   On: 12/04/2016 07:36   Dg Chest 2 View  Result Date: 12/01/2016 CLINICAL DATA:  Cough and shortness of breath. Pulmonary nodular lesions. EXAM: CHEST  2 VIEW COMPARISON:  November 26, 2016 FINDINGS: Nodular opacities have become larger bilaterally compared to recent study. Largest nodular lesion is in the right upper lobe, currently measuring 3.2 x 2.3 cm. There is a new nodular lesion in the left mid lung which has an area of central cavitation. This lesion measures 2.4 x 2.2 cm. There is ill-defined opacity in the right base which is new. Heart size and pulmonary vascularity are normal. No adenopathy. No bone lesions. IMPRESSION: Several new and several enlarging nodular lesions bilaterally. Nodular lesion in the left mid lung has central cavitation. This appearance is most consistent with septic emboli. New area of apparent airspace consolidation right base, likely superimposed pneumonia. Heart size within normal limits. No adenopathy evident. These results will be called to the ordering clinician or representative by the Radiologist Assistant, and communication documented in the PACS or zVision Dashboard. Electronically Signed   By: Lowella Grip III M.D.   On: 12/01/2016 07:57   Dg Chest 2 View  Result Date: 11/23/2016 CLINICAL DATA:  41 year old male with its sharp central chest pain onset this morning. Pleuritic pain. Initial encounter. EXAM: CHEST  2 VIEW COMPARISON:  Chest radiographs 09/25/2007. FINDINGS: Lower lung volumes with crowding of lung markings and extend to a shin of cardiac size. Other mediastinal contours are within normal limits. Visualized tracheal air column is within normal limits. No pneumothorax or pleural effusion. No pulmonary edema suspected. No confluent  pulmonary opacity. Negative visible bowel gas pattern. No acute osseous abnormality identified. IMPRESSION: Low lung volumes.  No acute cardiopulmonary abnormality. Electronically Signed   By: Genevie Ann M.D.   On: 11/23/2016 11:28   Ct Chest Wo Contrast  Result Date: 12/01/2016 CLINICAL DATA:  Pulmonary nodules.  Cough. EXAM: CT CHEST WITHOUT CONTRAST TECHNIQUE: Multidetector CT imaging of the  chest was performed following the standard protocol without IV contrast. COMPARISON:  11/24/2016. FINDINGS: Cardiovascular: Vascular structures are unremarkable. Heart size normal. No pericardial effusion. Mediastinum/Nodes: Mediastinal lymph nodes are not enlarged by CT size criteria. Hilar regions are difficult to evaluate without IV contrast. No axillary adenopathy. Esophagus is grossly unremarkable. Lungs/Pleura: New and enlarging rounded areas of consolidation with surrounding ground-glass are seen bilaterally. Some are now cavitary, for example 2 lesions in the left upper lobe. No pleural fluid. Airway is unremarkable. Upper Abdomen: Visualized portions of the liver, adrenal glands, right kidney, spleen, pancreas, stomach and bowel are grossly unremarkable. Musculoskeletal: No worrisome lytic or sclerotic lesions. IMPRESSION: New and enlarging areas of rounded consolidation with surrounding ground-glass and developing cavitation, most indicative of septic emboli. Electronically Signed   By: Lorin Picket M.D.   On: 12/01/2016 11:06   Ct Angio Chest Pe W Or Wo Contrast  Result Date: 11/24/2016 CLINICAL DATA:  Pt c/o left sided CP since last night, SOB Hx of HTN EXAM: CT ANGIOGRAPHY CHEST WITH CONTRAST TECHNIQUE: Multidetector CT imaging of the chest was performed using the standard protocol during bolus administration of intravenous contrast. Multiplanar CT image reconstructions and MIPs were obtained to evaluate the vascular anatomy. CONTRAST:  85cc isovue 370 COMPARISON:  None. FINDINGS: Cardiovascular: Left arm  IV contrast administration. Innominate vein and SVC patent. Right atrium and right ventricle nondilated. Satisfactory opacification of pulmonary arteries noted, and there is no evidence of pulmonary emboli. Patent bilateral pulmonary veins drain into the left atrium. Adequate contrast opacification of the thoracic aorta with no evidence of dissection, aneurysm, or stenosis. There is bovine variant brachiocephalic arch anatomy without proximal stenosis. Mediastinum/Nodes: No enlarged mediastinal, hilar, or axillary lymph nodes. Thyroid gland, trachea, and esophagus demonstrate no significant findings. Lungs/Pleura: Bilateral pulmonary nodules involving all lobes. The margins of most of the nodules are somewhat hazy and indistinct. None demonstrate calcification or definite cavitation. Representative lesions include 14 mm in the left apex, image 21/607 ; 12 mm pleural-based nodule, anterior right upper lobe image 43/607. There are patchy airspace opacities posteriorly in both lung bases probably a combination of pulmonary nodules and adjacent atelectasis/consolidation. Trace bilateral pleural effusions. No pneumothorax. Upper Abdomen: No acute abnormality. Musculoskeletal: Anterior vertebral endplate spurring at multiple levels in the mid and lower thoracic spine. Review of the MIP images confirms the above findings. IMPRESSION: 1. Negative for acute PE or thoracic aortic dissection. 2. Bilateral pulmonary nodules. May represent metastatic disease versus septic emboli. 3. Trace bilateral pleural effusions. Electronically Signed   By: Lucrezia Europe M.D.   On: 11/24/2016 11:45   Ct Abdomen Pelvis W Contrast  Result Date: 11/27/2016 CLINICAL DATA:  Patient with multiple pulmonary nodules. Metastatic disease not excluded. EXAM: CT ABDOMEN AND PELVIS WITH CONTRAST TECHNIQUE: Multidetector CT imaging of the abdomen and pelvis was performed using the standard protocol following bolus administration of intravenous contrast.  CONTRAST:  100 ISOVUE-300 IOPAMIDOL (ISOVUE-300) INJECTION 61% COMPARISON:  CT chest 11/24/2016. FINDINGS: Lower chest: Normal heart size. Multiple bilateral lower lobe pulmonary nodules are demonstrated measuring up to 3.2 cm within the subpleural left lower lobe. No pleural effusion. Hepatobiliary: Liver is normal in size and contour. Within the central aspect of the liver there is a 4.2 x 4.6 cm (image 22; series 2) low-attenuation lesion. Additionally within the lateral left hepatic lobe there is a 1.7 cm low-attenuation lesion. Within the peripheral right hepatic lobe there is a 1.0 cm low-attenuation lesion (image 18; series 2). Gallbladder is unremarkable.  No intrahepatic or extrahepatic biliary ductal dilatation. Pancreas: Unremarkable Spleen: Unremarkable Adrenals/Urinary Tract: The adrenal glands are normal. Kidneys enhance symmetrically with contrast. No hydronephrosis. Too small to characterize low-attenuation lesion interpolar region left kidney. Urinary bladder is unremarkable. Stomach/Bowel: Oral contrast material to the level of the rectum. Normal appendix. No abnormal bowel wall thickening or evidence for bowel obstruction. Normal morphology of the stomach. Vascular/Lymphatic: Peripheral calcified atherosclerotic plaque. No retroperitoneal lymphadenopathy. Reproductive: Prostate unremarkable. Other: None. Musculoskeletal: Lumbar spine degenerative changes. No aggressive or acute appearing osseous lesions. IMPRESSION: Multiple nodules are demonstrated within the lower lobes bilaterally, many are subpleural in location. Findings raise the possibility of septic emboli. Metastatic disease is not excluded. Multiple low-attenuation lesions within the liver. The largest measures 4.5 cm. These are indeterminate in etiology and need definitive characterization with pre and post contrast-enhanced MRI in the non-acute setting. Electronically Signed   By: Lovey Newcomer M.D.   On: 11/27/2016 21:48   Mr Liver Wo  Conrtast  Result Date: 11/29/2016 CLINICAL DATA:  41 year old male inpatient with multiple pulmonary nodules on chest CT performed for dyspnea and chest pain, presents for evaluation of multiple liver lesions on CT abdomen/pelvis study. EXAM: MRI ABDOMEN WITHOUT CONTRAST TECHNIQUE: Multiplanar multisequence MR imaging was performed without the administration of intravenous contrast. COMPARISON:  11/27/2016 CT abdomen/ pelvis. FINDINGS: Motion degraded scan. Examination was discontinued prior to completion at the patient's request. No T1 weighted imaging or contrast-enhanced imaging was obtained. Lower chest: Re- demonstrated are multiple scattered poorly marginated nodular opacities of various sizes throughout both lungs, not definitely changed from the recent chest CT, measuring up to the 3.2 cm in the posterior lower left lung (series 7/image 1). Hepatobiliary: Normal liver size and configuration. There are three similar-appearing mildly T2 hyperintense (with the T2 hyperintensity intermediate between bile and the spleen) liver masses scattered throughout the liver, measuring 6.0 x 4.2 cm in the caudate lobe (series 12/ image 18), 1.8 x 1.5 cm in the lateral segment left liver lobe (series 12/ image 16) and 0.9 x 0.9 cm in the segment 8 right liver lobe (series 12/ image 11). Normal gallbladder with no cholelithiasis. No biliary ductal dilatation. Common bile duct diameter 3 mm. No choledocholithiasis. Pancreas: No pancreatic mass or duct dilation.  No pancreas divisum. Spleen: Normal size. No mass. Adrenals/Urinary Tract: Normal adrenals. No hydronephrosis. Normal size kidneys. There a few scattered tiny simple appearing renal cysts in both kidneys measuring up to 1.0 cm in the lower left kidney, incompletely characterized on this limited noncontrast MRI. No T2 hypointense renal lesions. Stomach/Bowel: Grossly normal stomach. Visualized small and large bowel is normal caliber, with no bowel wall thickening.  Vascular/Lymphatic: Normal caliber abdominal aorta. No pathologically enlarged lymph nodes in the abdomen. Other: No abdominal ascites or focal fluid collection. Musculoskeletal: No aggressive appearing focal osseous lesions. IMPRESSION: 1. Motion degraded incomplete noncontrast MRI study, which was discontinued prior to completion at the patient's request. 2. Re- demonstration of multiple scattered poorly marginated nodular opacities of various sizes throughout both lungs, not definitely changed from the recent chest CT. The differential diagnosis for these nodular opacities is broad and includes infection, vasculitis or metastatic disease. 3. Three similar-appearing mildly T2 hyperintense liver masses scattered throughout the liver, which are incompletely characterized on this limited noncontrast MRI study and remain indeterminate. The differential includes benign causes such as hemangiomas, with malignancy not excluded. A repeat MRI abdomen without and with IV contrast is recommended on a short-term basis when clinically feasible. 4. Small  scattered simple appearing renal cysts, incompletely characterized. Electronically Signed   By: Ilona Sorrel M.D.   On: 11/29/2016 09:05   Nm Myocar Multi W/spect W/wall Motion / Ef  Result Date: 11/24/2016 CLINICAL DATA:  Chest pain, shortness of Breath EXAM: MYOCARDIAL IMAGING WITH SPECT (REST AND PHARMACOLOGIC-STRESS) GATED LEFT VENTRICULAR WALL MOTION STUDY LEFT VENTRICULAR EJECTION FRACTION TECHNIQUE: Standard myocardial SPECT imaging was performed after resting intravenous injection of 10 mCi Tc-88m tetrofosmin. Subsequently, intravenous infusion of Lexiscan was performed under the supervision of the Cardiology staff. At peak effect of the drug, 30 mCi Tc-10m tetrofosmin was injected intravenously and standard myocardial SPECT imaging was performed. Quantitative gated imaging was also performed to evaluate left ventricular wall motion, and estimate left ventricular  ejection fraction. COMPARISON:  None. FINDINGS: Perfusion: There is decreased activity noted anteriorly on the stress images, normal on the rest images, concerning for anterior wall ischemia Wall Motion: Mild generalized hypokinesia Left Ventricular Ejection Fraction: 43 % End diastolic volume [id number] ml End systolic volume 92 ml IMPRESSION: 1. Area of mild reversibility anteriorly concerning for ischemia. 2. Mild generalized hypokinesia. 3. Left ventricular ejection fraction 43% 4. Non invasive risk stratification*: High *2012 Appropriate Use Criteria for Coronary Revascularization Focused Update: J Am Coll Cardiol. N6492421. http://content.airportbarriers.com.aspx?articleid=[email or web address] Electronically Signed   By: Rolm Baptise M.D.   On: 11/24/2016 16:22   Dg Chest Port 1 View  Result Date: 11/26/2016 CLINICAL DATA:  Patient admitted for chest pain and cough 11/23/2016. Symptoms continue. EXAM: PORTABLE CHEST 1 VIEW COMPARISON:  PA and lateral chest and CT chest 11/23/2016. FINDINGS: There is cardiomegaly without edema. Nodular opacities seen on prior CT scan are not as well demonstrated although two opacities are identified in the right upper lobe. No pneumothorax or pleural effusion. IMPRESSION: Nodular opacities are better demonstrated on prior CT although 2 opacities are seen in the right upper lobe. Cardiomegaly without edema. Electronically Signed   By: Inge Rise M.D.   On: 11/26/2016 11:24    Lab Data:  CBC:  Recent Labs Lab 11/28/16 0429 12/01/16 0608  WBC 15.0* 19.1*  NEUTROABS  --  15.1*  HGB 11.4* 10.9*  HCT 34.0* 32.2*  MCV 84.8 85.0  PLT 393 [id number]*   Basic Metabolic Panel:  Recent Labs Lab 11/28/16 0429 12/01/16 0608  NA 131* 131*  K 3.9 4.1  CL 93* 94*  CO2 26 24  GLUCOSE 128* 139*  BUN 22* 20  CREATININE 1.50* 1.45*  CALCIUM 9.1 9.4   GFR: Estimated Creatinine Clearance: 88.4 mL/min (by C-G formula based on SCr of 1.45 mg/dL (H)). Liver Function  Tests: No results for input(s): AST, ALT, ALKPHOS, BILITOT, PROT, ALBUMIN in the last 168 hours. No results for input(s): LIPASE, AMYLASE in the last 168 hours. No results for input(s): AMMONIA in the last 168 hours. Coagulation Profile:  Recent Labs Lab 12/01/16 1101  INR 1.20   Cardiac Enzymes: No results for input(s): CKTOTAL, CKMB, CKMBINDEX, TROPONINI in the last 168 hours. BNP (last 3 results) No results for input(s): PROBNP in the last 8760 hours. HbA1C: No results for input(s): HGBA1C in the last 72 hours. CBG: No results for input(s): GLUCAP in the last 168 hours. Lipid Profile: No results for input(s): CHOL, HDL, LDLCALC, TRIG, CHOLHDL, LDLDIRECT in the last 72 hours. Thyroid Function Tests: No results for input(s): TSH, T4TOTAL, FREET4, T3FREE, THYROIDAB in the last 72 hours. Anemia Panel: No results for input(s): VITAMINB12, FOLATE, FERRITIN, TIBC, IRON, RETICCTPCT in the last 72 hours. Urine analysis:  No results found for: COLORURINE, APPEARANCEUR, LABSPEC, PHURINE, GLUCOSEU, HGBUR, BILIRUBINUR, KETONESUR, PROTEINUR, UROBILINOGEN, NITRITE, Corliss Skains M.D. [facility] 12/04/2016, 12:36 PM  Pager: [phone] Between 7am to 7pm - call Pager - [phone]  After 7pm go to [email or web address] - password [email or web address]  Call night coverage person covering after 7pm

## 2016-12-04 NOTE — Progress Notes (Signed)
Peripherally Inserted Central Catheter/Midline Placement  The IV Nurse has discussed with the patient and/or persons authorized to consent for the patient, the purpose of this procedure and the potential benefits and risks involved with this procedure.  The benefits include less needle sticks, lab draws from the catheter, and the patient may be discharged home with the catheter. Risks include, but not limited to, infection, bleeding, blood clot (thrombus formation), and puncture of an artery; nerve damage and irregular heartbeat and possibility to perform a PICC exchange if needed/ordered by physician.  Alternatives to this procedure were also discussed.  Bard Power PICC patient education guide, fact sheet on infection prevention and patient information card has been provided to patient /or left at bedside.    PICC/Midline Placement Documentation        Jeremy Hood 12/04/2016, 4:58 PM

## 2016-12-05 ENCOUNTER — Telehealth: Payer: Self-pay

## 2016-12-05 LAB — BASIC METABOLIC PANEL
Anion gap: 11 (ref 5–15)
BUN: 22 mg/dL — ABNORMAL HIGH (ref 6–20)
CALCIUM: 9.5 mg/dL (ref 8.9–10.3)
CO2: 26 mmol/L (ref 22–32)
CREATININE: 1.32 mg/dL — AB (ref 0.61–1.24)
Chloride: 99 mmol/L — ABNORMAL LOW (ref 101–111)
GFR calc Af Amer: >60 mL/min (ref >60)
GFR calc non Af Amer: >60 mL/min (ref >60)
GLUCOSE: 128 mg/dL — AB (ref 65–99)
Potassium: 3.6 mmol/L (ref 3.5–5.1)
Sodium: 136 mmol/L (ref 135–145)

## 2016-12-05 LAB — CBC
HCT: 32.3 % — ABNORMAL LOW (ref 39.0–52.0)
Hemoglobin: 10.8 g/dL — ABNORMAL LOW (ref 13.0–17.0)
MCH: 28.5 pg (ref 26.0–34.0)
MCHC: 33.4 g/dL (ref 30.0–36.0)
MCV: 85.2 fL (ref 78.0–100.0)
PLATELETS: 602 10*3/uL — AB (ref 150–400)
RBC: 3.79 MIL/uL — ABNORMAL LOW (ref 4.22–5.81)
RDW: 14.3 % (ref 11.5–15.5)
WBC: 18.1 10*3/uL — ABNORMAL HIGH (ref 4.0–10.5)

## 2016-12-05 MED ORDER — CHLORTHALIDONE 50 MG PO TABS: 50.0000 mg | ORAL_TABLET | Freq: Every day | ORAL | 3 refills | Status: DC

## 2016-12-05 MED ORDER — HYDROCOD POLST-CPM POLST ER 10-8 MG/5ML PO SUER: 5.0000 mL | Freq: Two times a day (BID) | ORAL | 0 refills | Status: DC | PRN

## 2016-12-05 MED ORDER — DEXTROSE 5 % IV SOLN
2.0000 g | INTRAVENOUS | Status: DC
  Administered 2016-12-05: 2 g via INTRAVENOUS
  Filled 2016-12-05: qty 2

## 2016-12-05 MED ORDER — DEXTROSE 5 % IV SOLN: 2.0000 g | INTRAVENOUS | 0 refills | Status: AC

## 2016-12-05 MED ORDER — BENZONATATE 100 MG PO CAPS: 100.0000 mg | ORAL_CAPSULE | Freq: Three times a day (TID) | ORAL | 0 refills | Status: DC

## 2016-12-05 MED ORDER — POTASSIUM CHLORIDE CRYS ER 20 MEQ PO TBCR
40.0000 meq | EXTENDED_RELEASE_TABLET | Freq: Every day | ORAL | Status: DC
Start: 2016-12-05 — End: 2016-12-05

## 2016-12-05 MED ORDER — METRONIDAZOLE 500 MG PO TABS: 500.0000 mg | ORAL_TABLET | Freq: Three times a day (TID) | ORAL | 0 refills | Status: DC

## 2016-12-05 MED ORDER — CYCLOBENZAPRINE HCL 5 MG PO TABS: 5.0000 mg | ORAL_TABLET | Freq: Three times a day (TID) | ORAL | 3 refills | Status: DC | PRN

## 2016-12-05 MED ORDER — HEPARIN SOD (PORK) LOCK FLUSH 100 UNIT/ML IV SOLN
250.0000 [IU] | INTRAVENOUS | Status: AC | PRN
  Administered 2016-12-05: 250 [IU]
  Filled 2016-12-05: qty 3

## 2016-12-05 MED ORDER — BENZONATATE 100 MG PO CAPS
100.0000 mg | ORAL_CAPSULE | Freq: Three times a day (TID) | ORAL | Status: DC
  Administered 2016-12-05: 100 mg via ORAL
  Filled 2016-12-05: qty 1

## 2016-12-05 MED ORDER — ASPIRIN 325 MG PO TBEC: 325.0000 mg | DELAYED_RELEASE_TABLET | Freq: Every day | ORAL | 3 refills | Status: DC

## 2016-12-05 MED ORDER — TRAMADOL HCL 50 MG PO TABS: 50.0000 mg | ORAL_TABLET | Freq: Four times a day (QID) | ORAL | 0 refills | Status: DC | PRN

## 2016-12-05 MED FILL — BENZONATATE 100 MG CAPSULE: 100 | 10 days supply | Qty: 30 | Fill #0

## 2016-12-05 MED FILL — ?METRONIDAZOLE 500 MG TABLE: 500 | 30 days supply | Qty: 90 | Fill #0

## 2016-12-05 MED FILL — CYCLOBENZAPRINE 5 MG TABLET: 5 | 20 days supply | Qty: 60 | Fill #0

## 2016-12-05 MED FILL — traMADol HCL 50 MG TABS: 50 | 8 days supply | Qty: 30 | Fill #0

## 2016-12-05 NOTE — Progress Notes (Signed)
Reviewed discharge instructions with patient and his wife they stated their understanding.  IV rocephin given for today's dose and PICC line flushed.  [facility] and given instructions.  Confirmed home health agency had new address for delivery of home medication.  Patient and wife instructed to get prescriptions filled at [place].  Discharged home with wife via wheelchair. Sanda Linger

## 2016-12-05 NOTE — Telephone Encounter (Signed)
Case discussed with [name]. RN with [facility]. The patient's appointment has been re-scheduled for 12/10/16 @0945  and the AVS has been updated with this information.  Informed Pam that at this time, [facility] does not have a nurse to do the weekly PICC line dressing changes. Raina Mina, RN is on vacation this week and this CM is not able to confirm with her that she is able to do the dressing changes.  An alternate plan for PICC dressing changes will need to be made unless it is confirmed that [facility] will be able to accommodate this request.   This CM contacted Howell Rucks, RN Family Medicine and she reported that Family Medicine doesn't do PICC line dressing changes.   Will continue to follow

## 2016-12-05 NOTE — Discharge Summary (Signed)
Physician Discharge Summary   Patient ID: Jeremy Hood MRN: [id number] DOB/AGE: [birth year] 41 y.o.  Admit date: 11/23/2016 Discharge date: 12/05/2016  Primary Care Physician:  No PCP Per Patient  Discharge Diagnoses:    Multifocal Cavitary pulmonary nodules/ consolidations ? Septic embolization . Pleuritic Chest pain . Cocaine abuse Tobacco abuse  Chronic periodontitis with bone loss . HTN (hypertension)   Consults:   Cardiology Infectious disease Pulmonary critical care Dental medicine   Recommendations for Outpatient Follow-up:  1. Continue Rocephin 2 g IV daily and Flagyl 500 mg 3 times a day until 12/28/2016 2. Please repeat CBC/BMET at next visit   DIET: Heart healthy diet    Allergies:  No Known Allergies   DISCHARGE MEDICATIONS: Current Discharge Medication List    START taking these medications   Details  aspirin EC 325 MG EC tablet Take 1 tablet (325 mg total) by mouth daily. Qty: 30 tablet, Refills: 3    benzonatate (TESSALON) 100 MG capsule Take 1 capsule (100 mg total) by mouth 3 (three) times daily. For cough Qty: 30 capsule, Refills: 0    cefTRIAXone 2 g in dextrose 5 % 50 mL Inject 2 g into the vein daily. End Date: March 11th, 2018 Qty: 30 ampule, Refills: 0    chlorpheniramine-HYDROcodone (TUSSIONEX) 10-8 MG/5ML SUER Take 5 mLs by mouth every 12 (twelve) hours as needed for cough. Qty: 115 mL, Refills: 0    chlorthalidone (HYGROTON) 50 MG tablet Take 1 tablet (50 mg total) by mouth daily. Qty: 30 tablet, Refills: 3    cyclobenzaprine (FLEXERIL) 5 MG tablet Take 1 tablet (5 mg total) by mouth 3 (three) times daily as needed for muscle spasms. Qty: 60 tablet, Refills: 3    metroNIDAZOLE (FLAGYL) 500 MG tablet Take 1 tablet (500 mg total) by mouth 3 (three) times daily. End Date: March 11th, 2018  Qty: 90 tablet, Refills: 0    traMADol (ULTRAM) 50 MG tablet Take 1 tablet (50 mg total) by mouth every 6 (six) hours as needed for  moderate pain or severe pain. Qty: 30 tablet, Refills: 0         Brief H and P: For complete details please refer to admission H and P, but in brief Jeremy Hood a 41 y.o.malepmhof tobacco abuse and uncontrolled hypertension presented to the emergency room with chief complaint of chest pain that was nonradiating and increased with coughing and movement. He also reported nonproductive cough with sweating and fevers. CT chest 11/24/16 revealed bilateral pulmonary nodules ? metastatic disease versus a septic emboli. Toxicology screen was positive for cocaine and marijuana.  CT chest with Lung nodules, Pulm consulting, septic emboli a consideration. Infectious disease was consulted. He underwent TEE on 2/7 which was negative for any vegetation.  Hospital Course:   Multifocal nodules now progressing to cavitary consolidations ? Septic embolization - CT of the chest showed progression of nodules with consolidation and early cavitation -Patient underwent TEE 2/7 which was negative for endocarditis. Patient denied any IV drug use, reported that he snorted only cocaine - Autoimmune work up negative thus far-ANA, ANCA, GBM Ab. RF mildly elevated - p infectious disease and pulmonology was consulted. HIV negative, ACE negative,Quantiferon Tb gold negative - MRI abd without contrast showed non specific liver lesions - Underwent unsuccessful bronch on 2/13 - Orthopantogram 2/13 showed apical lucency along the right upper posterior-most tooth/molar, Dr Tommie Raymond was consulted. Dr. Enrique Sack recommended the patient to contact Dr. Diona Browner with oral surgery for extraction of  tooth #1 and other teeth as indicated.  - Antibiotics narrowed down to Unasyn IV and subsequently upon discharge to IV Rocephin and oral Flagyl  till 12/28/2016. Plan to repeat CT scan chest after 4 weeks, if no improvement will need bronch at that time with biopsies. Patient will have weekly labs and take care at [facility], case management arranging the appointments. Patient has appointment for ID follow-up arranged.  Chest pain- suspect pleuritic - due to above, also seen by cardiology and underwent Myoview which was felt to be low risk, but official read notes mild ant ischemia. Per cardiology doubt this is angina  - d dimer was elevated but CTA negative for PE-- did show nodules - TEE Negative-denies IV drug use  Cocaine abuse -encouraged cessation  Tobacco abuse - Advised cessation   Day of Discharge BP 138/88   Pulse 84   Temp 98.3 F (36.8 C)   Resp 20   Ht 5\' 11"  (1.803 m)   Wt 117 kg (257 lb 15 oz)   SpO2 100%   BMI 35.98 kg/m   Physical Exam: General: Alert and awake oriented x3 not in any acute distress. HEENT: anicteric sclera, pupils reactive to light and accommodation CVS: S1-S2 clear no murmur rubs or gallops Chest: Decreased breath sounds at the bases Abdomen: soft nontender, nondistended, normal bowel sounds Extremities: no cyanosis, clubbing or edema noted bilaterally Neuro: Cranial nerves II-XII intact, no focal neurological deficits   The results of significant diagnostics from this hospitalization (including imaging, microbiology, ancillary and laboratory) are listed below for reference.    LAB RESULTS: Basic Metabolic Panel:  Recent Labs Lab 12/01/16 0608 12/05/16 0820  NA 131* 136  K 4.1 3.6  CL 94* 99*  CO2 24 26  GLUCOSE 139* 128*  BUN 20 22*  CREATININE 1.45* 1.32*  CALCIUM 9.4 9.5   Liver Function Tests: No results for input(s): AST, ALT, ALKPHOS, BILITOT, PROT, ALBUMIN in the last 168 hours. No results for input(s): LIPASE, AMYLASE in the last 168 hours. No results for input(s): AMMONIA in the last 168 hours. CBC:  Recent Labs Lab 12/01/16 0608 12/05/16 0820  WBC 19.1* 18.1*  NEUTROABS 15.1*  --   HGB 10.9* 10.8*  HCT 32.2* 32.3*  MCV 85.0 85.2  PLT 556* 602*   Cardiac Enzymes: No results for input(s): CKTOTAL, CKMB,  CKMBINDEX, TROPONINI in the last 168 hours. BNP: Invalid input(s): POCBNP CBG: No results for input(s): GLUCAP in the last 168 hours.  Significant Diagnostic Studies:  Dg Chest 2 View  Result Date: 11/23/2016 CLINICAL DATA:  41 year old male with its sharp central chest pain onset this morning. Pleuritic pain. Initial encounter. EXAM: CHEST  2 VIEW COMPARISON:  Chest radiographs 09/25/2007. FINDINGS: Lower lung volumes with crowding of lung markings and extend to a shin of cardiac size. Other mediastinal contours are within normal limits. Visualized tracheal air column is within normal limits. No pneumothorax or pleural effusion. No pulmonary edema suspected. No confluent pulmonary opacity. Negative visible bowel gas pattern. No acute osseous abnormality identified. IMPRESSION: Low lung volumes.  No acute cardiopulmonary abnormality. Electronically Signed   By: Genevie Ann M.D.   On: 11/23/2016 11:28   Ct Angio Chest Pe W Or Wo Contrast  Result Date: 11/24/2016 CLINICAL DATA:  Pt c/o left sided CP since last night, SOB Hx of HTN EXAM: CT ANGIOGRAPHY CHEST WITH CONTRAST TECHNIQUE: Multidetector CT imaging of the chest was performed using the standard protocol during bolus administration of intravenous  contrast. Multiplanar CT image reconstructions and MIPs were obtained to evaluate the vascular anatomy. CONTRAST:  85cc isovue 370 COMPARISON:  None. FINDINGS: Cardiovascular: Left arm IV contrast administration. Innominate vein and SVC patent. Right atrium and right ventricle nondilated. Satisfactory opacification of pulmonary arteries noted, and there is no evidence of pulmonary emboli. Patent bilateral pulmonary veins drain into the left atrium. Adequate contrast opacification of the thoracic aorta with no evidence of dissection, aneurysm, or stenosis. There is bovine variant brachiocephalic arch anatomy without proximal stenosis. Mediastinum/Nodes: No enlarged mediastinal, hilar, or axillary lymph nodes.  Thyroid gland, trachea, and esophagus demonstrate no significant findings. Lungs/Pleura: Bilateral pulmonary nodules involving all lobes. The margins of most of the nodules are somewhat hazy and indistinct. None demonstrate calcification or definite cavitation. Representative lesions include 14 mm in the left apex, image 21/607 ; 12 mm pleural-based nodule, anterior right upper lobe image 43/607. There are patchy airspace opacities posteriorly in both lung bases probably a combination of pulmonary nodules and adjacent atelectasis/consolidation. Trace bilateral pleural effusions. No pneumothorax. Upper Abdomen: No acute abnormality. Musculoskeletal: Anterior vertebral endplate spurring at multiple levels in the mid and lower thoracic spine. Review of the MIP images confirms the above findings. IMPRESSION: 1. Negative for acute PE or thoracic aortic dissection. 2. Bilateral pulmonary nodules. May represent metastatic disease versus septic emboli. 3. Trace bilateral pleural effusions. Electronically Signed   By: Lucrezia Europe M.D.   On: 11/24/2016 11:45   Nm Myocar Multi W/spect W/wall Motion / Ef  Result Date: 11/24/2016 CLINICAL DATA:  Chest pain, shortness of Breath EXAM: MYOCARDIAL IMAGING WITH SPECT (REST AND PHARMACOLOGIC-STRESS) GATED LEFT VENTRICULAR WALL MOTION STUDY LEFT VENTRICULAR EJECTION FRACTION TECHNIQUE: Standard myocardial SPECT imaging was performed after resting intravenous injection of 10 mCi Tc-13m tetrofosmin. Subsequently, intravenous infusion of Lexiscan was performed under the supervision of the Cardiology staff. At peak effect of the drug, 30 mCi Tc-74m tetrofosmin was injected intravenously and standard myocardial SPECT imaging was performed. Quantitative gated imaging was also performed to evaluate left ventricular wall motion, and estimate left ventricular ejection fraction. COMPARISON:  None. FINDINGS: Perfusion: There is decreased activity noted anteriorly on the stress images, normal on  the rest images, concerning for anterior wall ischemia Wall Motion: Mild generalized hypokinesia Left Ventricular Ejection Fraction: 43 % End diastolic volume [id number] ml End systolic volume 92 ml IMPRESSION: 1. Area of mild reversibility anteriorly concerning for ischemia. 2. Mild generalized hypokinesia. 3. Left ventricular ejection fraction 43% 4. Non invasive risk stratification*: High *2012 Appropriate Use Criteria for Coronary Revascularization Focused Update: J Am Coll Cardiol. N6492421. http://content.airportbarriers.com.aspx?articleid=1201161 Electronically Signed   By: Rolm Baptise M.D.   On: 11/24/2016 16:22    2D ECHO: Study Conclusions  - Left ventricle: Systolic function was normal. The estimated   ejection fraction was in the range of 60% to 65%. - Aortic valve: No evidence of vegetation. - Mitral valve: No evidence of vegetation. - Left atrium: No evidence of thrombus in the atrial cavity or   appendage. - Right atrium: No evidence of thrombus in the atrial cavity or   appendage. - Tricuspid valve: No evidence of vegetation.   Disposition and Follow-up: Discharge Instructions    Diet - low sodium heart healthy    Complete by:  As directed    Increase activity slowly    Complete by:  As directed        DISPOSITION: home    [facility]. Go on 12/08/2016.   Why:  at 3:45 pm for a hospital folllow up appointment.   Labs weekly while on IV antibiotics: CBC with differential, CMP  Contact information: [place] [phone]       Call Gae Bon, DDS.   Specialty:  Oral Surgery Why:  Evaluation for Extraction #1 and others as indicted Contact information: [place] [address] [phone]        Cornelius Van Dam, MD. Go on 01/05/2017.   Specialty:  Infectious Diseases Why:  Follow up @ 330pm Contact  information: [address] [place] [address] [phone]            Time spent on Discharge: 83mins   Signed:   RAI,RIPUDEEP M.D. [facility] 12/05/2016, 11:57 AM Pager: [phone]

## 2016-12-05 NOTE — Discharge Instructions (Signed)
Cough, Adult Coughing is a reflex that clears your throat and your airways. Coughing helps to heal and protect your lungs. It is normal to cough occasionally, but a cough that happens with other symptoms or lasts a long time may be a sign of a condition that needs treatment. A cough may last only 2-3 weeks (acute), or it may last longer than 8 weeks (chronic). What are the causes? Coughing is commonly caused by:  Breathing in substances that irritate your lungs.  A viral or bacterial respiratory infection.  Allergies.  Asthma.  Postnasal drip.  Smoking.  Acid backing up from the stomach into the esophagus (gastroesophageal reflux).  Certain medicines.  Chronic lung problems, including COPD (or rarely, lung cancer).  Other medical conditions such as heart failure. Follow these instructions at home: Pay attention to any changes in your symptoms. Take these actions to help with your discomfort:  Take medicines only as told by your health care provider.  If you were prescribed an antibiotic medicine, take it as told by your health care provider. Do not stop taking the antibiotic even if you start to feel better.  Talk with your health care provider before you take a cough suppressant medicine.  Drink enough fluid to keep your urine clear or pale yellow.  If the air is dry, use a cold steam vaporizer or humidifier in your bedroom or your home to help loosen secretions.  Avoid anything that causes you to cough at work or at home.  If your cough is worse at night, try sleeping in a semi-upright position.  Avoid cigarette smoke. If you smoke, quit smoking. If you need help quitting, ask your health care provider.  Avoid caffeine.  Avoid alcohol.  Rest as needed. Contact a health care provider if:  You have new symptoms.  You cough up pus.  Your cough does not get better after 2-3 weeks, or your cough gets worse.  You cannot control your cough with suppressant medicines  and you are losing sleep.  You develop pain that is getting worse or pain that is not controlled with pain medicines.  You have a fever.  You have unexplained weight loss.  You have night sweats. Get help right away if:  You cough up blood.  You have difficulty breathing.  Your heartbeat is very fast. This information is not intended to replace advice given to you by your health care provider. Make sure you discuss any questions you have with your health care provider. Document Released: 04/04/2011 Document Revised: 03/13/2016 Document Reviewed: 12/13/2014 Elsevier Interactive Patient Education  2017 [place].   Tooth Injuries Introduction Tooth injuries (tooth trauma) include cracked or broken teeth (fractures), teeth that have been moved out of place or dislodged (luxations), and knocked-out teeth (avulsions). A tooth injury often needs to be treated quickly to save the tooth. However, sometimes it is not possible to save a tooth after an injury, so the tooth may need to be removed (extracted). What are the causes? Tooth injuries may be caused by any force that is strong enough to chip, break, dislodge, or knock out a tooth. Forces may be due to:  Sports injuries.  Falls.  Accidents.  Fights. What increases the risk? You may be more likely to injure a tooth if you play a contact sport without using a mouthguard. What are the signs or symptoms? A tooth that is forced into the gum may appear dislodged or moved out of position into the tooth socket. A  fractured tooth may not be as obvious. Symptoms of a tooth injury include:  Pain, especially with chewing.  A loose tooth.  Bleeding in or around the tooth.  Swelling or bruising near the tooth.  Swelling or bruising of the lip over the injured tooth.  Increased sensitivity to heat and cold. How is this diagnosed? A tooth injury can be diagnosed with a medical history and a physical exam. You may also need dental  X-rays to check for injuries to the root of the tooth. How is this treated? Treatment depends on the type of injury you have and how bad it is. Treatment may need to be done quickly to save your tooth. Possible treatments include:  Replacing a tooth fragment with a filling, cap, or hard, protective cover (crown). This may be an option for a chip or fracture that does not involve the inside of your tooth (pulp).  Having a procedure to repair the inside of the tooth (root canal) and then having a crown placed on top. This may be done to treat a tooth fracture that involves the pulp.  Repositioning a dislodged tooth, then doing a root canal. The root canal usually needs to be done within a few days of the injury.  Replacing a knocked-out tooth in the socket, if possible, then doing a root canal a few weeks later.  Tooth extraction for a fracture that extends below your gumline or splits your tooth completely. Follow these instructions at home:  Take medicines only as directed by your dental provider or health care provider.  Keep all follow-up visits as directed by your dental provider or health care provider. This is important.  Do not eat or chew on very hard objects. These include ice cubes, pens, pencils, hard candy, and popcorn kernels.  Do not clench or grind your teeth. Tell your dental provider or health care provider if you grind your teeth while you sleep.  Apply ice to your mouth near the injured tooth as directed by your dental provider or health care provider.  Follow instructions about rinsing your mouth with salt water as directed by your dental provider or health care provider.  Do not use your teeth to open packages.  Always wear mouth protection when you play contact sports. Contact a health care provider if:  You continue to have tooth pain after a tooth injury.  Your tooth is sensitive to heat and cold.  You develop swelling near your injured tooth.  You have a  fever.  You are unable to open your jaw.  You are drooling and it is getting worse. This information is not intended to replace advice given to you by your health care provider. Make sure you discuss any questions you have with your health care provider. Document Released: 07/03/2004 Document Revised: 03/13/2016 Document Reviewed: 10/02/2014  2017 Elsevier

## 2016-12-08 ENCOUNTER — Telehealth: Payer: Self-pay

## 2016-12-08 ENCOUNTER — Inpatient Hospital Stay: Payer: Self-pay | Admitting: Family Medicine

## 2016-12-08 NOTE — Telephone Encounter (Signed)
Call received from [place]. She reported that she waiting for signed orders for the patient to receive his PICC dressing changes and labwork at the sickle cell clinic this week.

## 2016-12-08 NOTE — Telephone Encounter (Signed)
Attempted to contact Jeremy Hood, [facility] # [phone] to confirm the plan for PICC dressing changes as the patient is in the clinic and is waiting for a PICC dressing change.     Met with the patient when he was in the clinic today. He thought that he had an appointment at [facility] today to have his PICC line dressing changed. Apologized for any confusion and explained to him that his appointment at [facility] is scheduled for 12/10/16 @ 0945 with Dr Jeremy Hood for a hospital follow up. Also explained to him that there is no one at the clinic at this time to change the PICC line dressing and this CM left a message for Jeremy Hood, [facility], to confirm the plan for PICC dressing changes. He said that he does not have the money for the [facility] nurse visits and opted out of that service.  He said that the corner of the border dressing of his PICC dressing was starting to come loose. Jeremy Hood, [facility] T[facility] RN notified.   Provided him with an application for the [facility] and explained to him to that he should gather the required documentation and schedule an appointment with the financial counselor and he stated that he would do that.

## 2016-12-09 ENCOUNTER — Telehealth: Payer: Self-pay

## 2016-12-09 NOTE — Telephone Encounter (Signed)
Call placed to [name], RN - [facility] to confirm the patient's plan for PICC dressing changes. Informed her that Raina Mina, RN can change the PICC dressing tomorrow and he can have his labs done at [facility]. Pam confirmed that if needed the sickle cell clinic has orders for the dressing change and labs and the patient can go there on Thursday , 12/11/16 if for some reason the dressing change and labs can't be done at [facility] on 12/11/16. Pam also confirmed that the patient will have supplies for PICC line flushes. Carilyn Goodpasture, RN Triage nurse, is available to draw the labs. Pam stated that she would fax a copy of the orders for [facility].   Dr Jarold Song notified that the plan is for the patient to have his PICC dressing change done by Raina Mina, RN when he comes for his appointment tomorrow at [phone].   Call placed to the patient and he confirmed his appointment for tomorrow - 12/11/16 @ 0945 with Dr Jarold Song. Instructed him to bring all of his PICC supplies to his appointment as well as his oral medications for Dr Jarold Song to review. Explained to him that the plan is for his PICC dressing to be done at [facility] tomorrow but if for some reason, it is not able to be done, he will be scheduled for an appointment at the [facility] on Thursday , 12/11/16 for the dressing change.  He was very appreciative of the help and confirmed that he will bring all supplies and medications.

## 2016-12-09 NOTE — Telephone Encounter (Signed)
Correction to prior telephone call: the patient's appointment is 12/10/16 and the appointment at the [facility] is 12/11/16 if needed.

## 2016-12-10 ENCOUNTER — Encounter: Payer: Self-pay | Admitting: Family Medicine

## 2016-12-10 ENCOUNTER — Ambulatory Visit: Payer: Self-pay | Attending: Family Medicine | Admitting: Family Medicine

## 2016-12-10 VITALS — BP 145/90 | HR 83 | Temp 98.0°F | Ht 72.0 in | Wt 268.2 lb

## 2016-12-10 DIAGNOSIS — R918 Other nonspecific abnormal finding of lung field: Secondary | ICD-10-CM | POA: Insufficient documentation

## 2016-12-10 DIAGNOSIS — I76 Septic arterial embolism: Secondary | ICD-10-CM | POA: Insufficient documentation

## 2016-12-10 DIAGNOSIS — E669 Obesity, unspecified: Secondary | ICD-10-CM | POA: Insufficient documentation

## 2016-12-10 DIAGNOSIS — Z9889 Other specified postprocedural states: Secondary | ICD-10-CM | POA: Insufficient documentation

## 2016-12-10 DIAGNOSIS — I10 Essential (primary) hypertension: Secondary | ICD-10-CM | POA: Insufficient documentation

## 2016-12-10 DIAGNOSIS — Z7982 Long term (current) use of aspirin: Secondary | ICD-10-CM | POA: Insufficient documentation

## 2016-12-10 DIAGNOSIS — K047 Periapical abscess without sinus: Secondary | ICD-10-CM | POA: Insufficient documentation

## 2016-12-10 DIAGNOSIS — Z79899 Other long term (current) drug therapy: Secondary | ICD-10-CM | POA: Insufficient documentation

## 2016-12-10 LAB — CBC WITH DIFFERENTIAL/PLATELET
BASOS PCT: 1 %
Basophils Absolute: 100 cells/uL (ref 0–200)
EOS ABS: 100 {cells}/uL (ref 15–500)
Eosinophils Relative: 1 %
HCT: 32.5 % — ABNORMAL LOW (ref 38.5–50.0)
Hemoglobin: 10.4 g/dL — ABNORMAL LOW (ref 13.2–17.1)
LYMPHS PCT: 24 %
Lymphs Abs: 2400 cells/uL (ref 850–3900)
MCH: 28 pg (ref 27.0–33.0)
MCHC: 32 g/dL (ref 32.0–36.0)
MCV: 87.4 fL (ref 80.0–100.0)
MONOS PCT: 5 %
MPV: 9.2 fL (ref 7.5–12.5)
Monocytes Absolute: 500 cells/uL (ref 200–950)
NEUTROS PCT: 69 %
Neutro Abs: 6900 cells/uL (ref 1500–7800)
PLATELETS: 527 10*3/uL — AB (ref 140–400)
RBC: 3.72 MIL/uL — ABNORMAL LOW (ref 4.20–5.80)
RDW: 14.9 % (ref 11.0–15.0)
WBC: 10 10*3/uL (ref 3.8–10.8)

## 2016-12-10 LAB — COMPLETE METABOLIC PANEL WITH GFR
ALT: 30 U/L (ref 9–46)
AST: 19 U/L (ref 10–40)
Albumin: 3.4 g/dL — ABNORMAL LOW (ref 3.6–5.1)
Alkaline Phosphatase: 110 U/L (ref 40–115)
BILIRUBIN TOTAL: 0.2 mg/dL (ref 0.2–1.2)
BUN: 18 mg/dL (ref 7–25)
CALCIUM: 9 mg/dL (ref 8.6–10.3)
CO2: 28 mmol/L (ref 20–31)
CREATININE: 1.31 mg/dL (ref 0.60–1.35)
Chloride: 102 mmol/L (ref 98–110)
GFR, Est African American: 78 mL/min (ref >=60)
GFR, Est Non African American: 68 mL/min (ref >=60)
Glucose, Bld: 113 mg/dL — ABNORMAL HIGH (ref 65–99)
Potassium: 3.7 mmol/L (ref 3.5–5.3)
Sodium: 140 mmol/L (ref 135–146)
TOTAL PROTEIN: 7.4 g/dL (ref 6.1–8.1)

## 2016-12-10 MED ORDER — LISINOPRIL 5 MG PO TABS: 5.0000 mg | ORAL_TABLET | Freq: Every day | ORAL | 3 refills | Status: DC

## 2016-12-10 NOTE — Progress Notes (Signed)
Subjective:  Patient ID: Jeremy Hood, male    DOB: Jul 27, 1976  Age: 41 y.o. MRN: [id number]  CC: Blood Infection; Hypertension; and dental infection   HPI Jeremy Hood is a 41 year old male with a history of obesity, hypertension who presents for hospital follow-up from [facility] where he was admitted from 11/23/16 through 12/05/16 for possible septic embolization secondary to multifocal cavitary pulmonary nodules/consolidations.  He had presented with pleuritic chest pain and cough, sweating and fevers, CT chest 11/24/16 revealed bilateral pulmonary nodules ? metastatic disease versus a septic emboli. Toxicology screen was positive for cocaine and marijuana.  CT chest with Lung nodules, Pulm consulting, septic emboli a consideration. Infectious disease was consulted. He underwent TEE on 2/7 which was negative for any vegetation; he was placed on IV antibiotics. Other workup including autoimmune workup, and serum TB cold, HIV, ACE, bronchoscopy were all negative. Myoview test was felt to be lower risk, CTE was negative for PE. Oral surgery had recommended extraction of tooth#1 due to abnormal findings on CT scan.   Today he denies shortness of breath, chest pain, night sweats, fevers and has been compliant with his antibiotics. He has no complaints today. He has an upcoming appointment with infectious disease on 01/05/17  Past Medical History:  Diagnosis Date  . Chest pain 11/2016  . Hypertension     Past Surgical History:  Procedure Laterality Date  . ADENOIDECTOMY    . TEE WITHOUT CARDIOVERSION N/A 11/26/2016   Procedure: TRANSESOPHAGEAL ECHOCARDIOGRAM (TEE);  Surgeon: Thayer Headings, MD;  Location: [place];  Service: Cardiovascular;  Laterality: N/A;  . VIDEO BRONCHOSCOPY Bilateral 12/02/2016   Procedure: VIDEO BRONCHOSCOPY WITHOUT FLUORO;  Surgeon: Raylene Miyamoto, MD;  Location: [place];  Service: Cardiopulmonary;  Laterality: Bilateral;    No Known  Allergies   Outpatient Medications Prior to Visit  Medication Sig Dispense Refill  . aspirin EC 325 MG EC tablet Take 1 tablet (325 mg total) by mouth daily. 30 tablet 3  . cefTRIAXone 2 g in dextrose 5 % 50 mL Inject 2 g into the vein daily. End Date: March 11th, 2018 30 ampule 0  . chlorthalidone (HYGROTON) 50 MG tablet Take 1 tablet (50 mg total) by mouth daily. 30 tablet 3  . metroNIDAZOLE (FLAGYL) 500 MG tablet Take 1 tablet (500 mg total) by mouth 3 (three) times daily. End Date: March 11th, 2018  90 tablet 0  . traMADol (ULTRAM) 50 MG tablet Take 1 tablet (50 mg total) by mouth every 6 (six) hours as needed for moderate pain or severe pain. 30 tablet 0  . benzonatate (TESSALON) 100 MG capsule Take 1 capsule (100 mg total) by mouth 3 (three) times daily. For cough (Patient not taking: Reported on 12/10/2016) 30 capsule 0  . chlorpheniramine-HYDROcodone (TUSSIONEX) 10-8 MG/5ML SUER Take 5 mLs by mouth every 12 (twelve) hours as needed for cough. (Patient not taking: Reported on 12/10/2016) 115 mL 0  . cyclobenzaprine (FLEXERIL) 5 MG tablet Take 1 tablet (5 mg total) by mouth 3 (three) times daily as needed for muscle spasms. (Patient not taking: Reported on 12/10/2016) 60 tablet 3   No facility-administered medications prior to visit.     ROS Review of Systems  Constitutional: Negative for activity change and appetite change.  HENT: Negative for sinus pressure and sore throat.   Eyes: Negative for visual disturbance.  Respiratory: Negative for cough, chest tightness and shortness of breath.   Cardiovascular: Negative for chest pain and leg  swelling.  Gastrointestinal: Negative for abdominal distention, abdominal pain, constipation and diarrhea.  Endocrine: Negative.   Genitourinary: Negative for dysuria.  Musculoskeletal: Negative for joint swelling and myalgias.  Skin: Negative for rash.  Allergic/Immunologic: Negative.   Neurological: Negative for weakness, light-headedness and  numbness.  Psychiatric/Behavioral: Negative for dysphoric mood and suicidal ideas.    Objective:  BP (!) 145/90 (BP Location: Left Arm, Patient Position: Sitting, Cuff Size: Large)   Pulse 83   Temp 98 F (36.7 C) (Oral)   Ht 6' (1.829 m)   Wt 268 lb 3.2 oz (121.7 kg)   SpO2 97%   BMI 36.37 kg/m   BP/Weight 12/10/2016 12/05/2016 [id number]  Systolic BP [id number]  Diastolic BP 90 88 90  Wt. (Lbs) 268.2 257.94 -  BMI 36.37 35.98 -     Physical Exam  Constitutional: He is oriented to person, place, and time. He appears well-developed and well-nourished.  Obese  Cardiovascular: Normal rate, normal heart sounds and intact distal pulses.   No murmur heard. Pulmonary/Chest: Effort normal and breath sounds normal. He has no wheezes. He has no rales. He exhibits no tenderness.  Abdominal: Soft. Bowel sounds are normal. He exhibits no distension and no mass. There is no tenderness.  Musculoskeletal: Normal range of motion.  PICC line in right forearm, no sign of infection  Neurological: He is alert and oriented to person, place, and time.     Assessment & Plan:   1. Lung mass Currently on IV Rocephin, Flagyl PICC line dressing change performed in the clinic Keep upcoming appointment with infectious disease  2. Abnormal chest x-ray with multiple lung nodules ?Septic emboli Asymptomatic - CBC with Differential/Platelet  3. Essential hypertension Slightly elevated above goal of less than 130/80 Lisinopril added to regimen Continue chlorthalidone Low-sodium, DASH diet - lisinopril (PRINIVIL,ZESTRIL) 5 MG tablet; Take 1 tablet (5 mg total) by mouth daily.  Dispense: 30 tablet; Refill: 3 - COMPLETE METABOLIC PANEL WITH GFR  4. Obesity (BMI 35.0-39.9 without comorbidity) Discussed reducing portion sizes, increasing physical activity  He dropped off FMLA paperwork for his spouse (she will need to be out for a few hours 2-3days/week to help with medication administration) which  I advised him to be completed in the course of the week.  Meds ordered this encounter  Medications  . lisinopril (PRINIVIL,ZESTRIL) 5 MG tablet    Sig: Take 1 tablet (5 mg total) by mouth daily.    Dispense:  30 tablet    Refill:  3    Follow-up: Return in about 1 week (around 12/17/2016) for Follow-up on lung lesions and PICC line dressing change.   Arnoldo Morale MD

## 2016-12-12 ENCOUNTER — Telehealth: Payer: Self-pay | Admitting: *Deleted

## 2016-12-12 NOTE — Telephone Encounter (Signed)
-----   Message from Arnoldo Morale, MD sent at 12/11/2016  2:13 PM EST ----- He is slightly anemic but this is stable compared to last set of labs.

## 2016-12-12 NOTE — Telephone Encounter (Signed)
Medical Assistant left message on patient's home and cell voicemail. Voicemail states to give a call back to Wilena Tyndall with [facility] at [phone].  

## 2016-12-17 ENCOUNTER — Encounter: Payer: Self-pay | Admitting: Family Medicine

## 2016-12-17 ENCOUNTER — Ambulatory Visit: Payer: Self-pay | Attending: Family Medicine | Admitting: Family Medicine

## 2016-12-17 VITALS — BP 160/100 | HR 92 | Temp 98.2°F | Ht 72.0 in | Wt 269.8 lb

## 2016-12-17 DIAGNOSIS — D649 Anemia, unspecified: Secondary | ICD-10-CM | POA: Insufficient documentation

## 2016-12-17 DIAGNOSIS — I10 Essential (primary) hypertension: Secondary | ICD-10-CM | POA: Insufficient documentation

## 2016-12-17 DIAGNOSIS — E669 Obesity, unspecified: Secondary | ICD-10-CM | POA: Insufficient documentation

## 2016-12-17 DIAGNOSIS — Z9889 Other specified postprocedural states: Secondary | ICD-10-CM | POA: Insufficient documentation

## 2016-12-17 DIAGNOSIS — R918 Other nonspecific abnormal finding of lung field: Secondary | ICD-10-CM | POA: Insufficient documentation

## 2016-12-17 DIAGNOSIS — D509 Iron deficiency anemia, unspecified: Secondary | ICD-10-CM

## 2016-12-17 DIAGNOSIS — Z452 Encounter for adjustment and management of vascular access device: Secondary | ICD-10-CM | POA: Insufficient documentation

## 2016-12-17 LAB — COMPLETE METABOLIC PANEL WITH GFR
ALBUMIN: 3.7 g/dL (ref 3.6–5.1)
ALK PHOS: 84 U/L (ref 40–115)
ALT: 49 U/L — ABNORMAL HIGH (ref 9–46)
AST: 40 U/L (ref 10–40)
BUN: 17 mg/dL (ref 7–25)
CHLORIDE: 100 mmol/L (ref 98–110)
CO2: 26 mmol/L (ref 20–31)
Calcium: 9.2 mg/dL (ref 8.6–10.3)
Creat: 1.26 mg/dL (ref 0.60–1.35)
GFR, EST NON AFRICAN AMERICAN: 71 mL/min (ref >=60)
GFR, Est African American: 82 mL/min (ref >=60)
Glucose, Bld: 122 mg/dL — ABNORMAL HIGH (ref 65–99)
POTASSIUM: 3.2 mmol/L — AB (ref 3.5–5.3)
SODIUM: 139 mmol/L (ref 135–146)
Total Bilirubin: 0.2 mg/dL (ref 0.2–1.2)
Total Protein: 7.4 g/dL (ref 6.1–8.1)

## 2016-12-17 LAB — CBC WITH DIFFERENTIAL/PLATELET
BASOS ABS: 66 {cells}/uL (ref 0–200)
Basophils Relative: 1 %
EOS PCT: 2 %
Eosinophils Absolute: 132 cells/uL (ref 15–500)
HEMATOCRIT: 35.2 % — AB (ref 38.5–50.0)
HEMOGLOBIN: 11.4 g/dL — AB (ref 13.2–17.1)
LYMPHS ABS: 1716 {cells}/uL (ref 850–3900)
LYMPHS PCT: 26 %
MCH: 28 pg (ref 27.0–33.0)
MCHC: 32.4 g/dL (ref 32.0–36.0)
MCV: 86.5 fL (ref 80.0–100.0)
MPV: 9.5 fL (ref 7.5–12.5)
Monocytes Absolute: 660 cells/uL (ref 200–950)
Monocytes Relative: 10 %
NEUTROS PCT: 61 %
Neutro Abs: 4026 cells/uL (ref 1500–7800)
Platelets: 314 10*3/uL (ref 140–400)
RBC: 4.07 MIL/uL — AB (ref 4.20–5.80)
RDW: 15.9 % — AB (ref 11.0–15.0)
WBC: 6.6 10*3/uL (ref 3.8–10.8)

## 2016-12-17 LAB — MISC LABCORP TEST (SEND OUT): LABCORP TEST CODE: 9985

## 2016-12-17 MED ORDER — FERROUS SULFATE 325 (65 FE) MG PO TABS: 325.0000 mg | ORAL_TABLET | Freq: Two times a day (BID) | ORAL | 3 refills | Status: DC

## 2016-12-17 NOTE — Progress Notes (Signed)
Subjective:    Patient ID: Jeremy Hood, male    DOB: [birth year], 41 y.o.   MRN: [id number]  HPI PORTER BUSTILLOS is a 41 year old male with a history of obesity, hypertension who presents for PICC line dressing change. He was hospitalized at [facility] where he was admitted from 11/23/16 through 12/05/16 for possible septic embolization secondary to multifocal cavitary pulmonary nodules/consolidations.  He had presented with pleuritic chest pain and cough, sweating and fevers, CT chest 11/24/16 revealed bilateral pulmonary nodules ? metastatic disease versus a septic emboli. Toxicology screen was positive for cocaine and marijuana.  CT chest with Lung nodules, Pulm consulting, septic emboli a consideration. Infectious disease was consulted. He underwent TEE on 2/7 which was negative for any vegetation; he was placed on IV antibiotics. Other workup including autoimmune workup, and serum TB cold, HIV, ACE, bronchoscopy were all negative. Myoview test was felt to be lower risk, CTE was negative for PE.   Today he denies shortness of breath, chest pain, night sweats, fevers and has been compliant with his antibiotics which will be completed on 12/28/16.  His blood pressure is undefeated and he is yet to pick up his lisinopril which was prescribed at his last office visit. He has no complaints today. He has an upcoming appointment with infectious disease on 01/05/17.  Past Medical History:  Diagnosis Date  . Chest pain 11/2016  . Hypertension     Past Surgical History:  Procedure Laterality Date  . ADENOIDECTOMY    . TEE WITHOUT CARDIOVERSION N/A 11/26/2016   Procedure: TRANSESOPHAGEAL ECHOCARDIOGRAM (TEE);  Surgeon: Thayer Headings, MD;  Location: [place];  Service: Cardiovascular;  Laterality: N/A;  . VIDEO BRONCHOSCOPY Bilateral 12/02/2016   Procedure: VIDEO BRONCHOSCOPY WITHOUT FLUORO;  Surgeon: Raylene Miyamoto, MD;  Location: [place];  Service: Cardiopulmonary;   Laterality: Bilateral;    No Known Allergies   Review of Systems Constitutional: Negative for activity change and appetite change.  HENT: Negative for sinus pressure and sore throat.   Eyes: Negative for visual disturbance.  Respiratory: Negative for cough, chest tightness and shortness of breath.   Cardiovascular: Negative for chest pain and leg swelling.  Gastrointestinal: Negative for abdominal distention, abdominal pain, constipation and diarrhea.  Endocrine: Negative.   Genitourinary: Negative for dysuria.  Musculoskeletal: Negative for joint swelling and myalgias.  Skin: Negative for rash.  Allergic/Immunologic: Negative.   Neurological: Negative for weakness, light-headedness and numbness.  Psychiatric/Behavioral: Negative for dysphoric mood and suicidal ideas.     Objective: Vitals:   12/17/16 1136  BP: (!) 160/100  Pulse: 92  Temp: 98.2 F (36.8 C)  TempSrc: Oral  SpO2: 98%  Weight: 269 lb 12.8 oz (122.4 kg)  Height: 6' (1.829 m)      Physical Exam  Constitutional: He is oriented to person, place, and time. He appears well-developed and well-nourished.  Obese  Cardiovascular: Normal rate, normal heart sounds and intact distal pulses.   No murmur heard. Pulmonary/Chest: Effort normal and breath sounds normal. He has no wheezes. He has no rales. He exhibits no tenderness.  Abdominal: Soft. Bowel sounds are normal. He exhibits no distension and no mass. There is no tenderness.  Musculoskeletal: Normal range of motion.  PICC line in right forearm, slight bleeding at site of insertion  Neurological: He is alert and oriented to person, place, and time.        Assessment & Plan:  1. Abnormal chest x-ray with multiple lung nodules ?Septic emboli Asymptomatic -  CBC with Differential/Platelet PICC dressing change performed in the clinic Continue IV antibiotics until 12/28/16 Keep appointment with infectious disease on 01/05/17  2. Essential  hypertension Uncontrolled Advised to pickup lisinopril from pharmacy Continue chlorthalidone Low-sodium, DASH diet - lisinopril (PRINIVIL,ZESTRIL) 5 MG tablet; Take 1 tablet (5 mg total) by mouth daily.  Dispense: 30 tablet; Refill: 3 - COMPLETE METABOLIC PANEL WITH GFR  3. Obesity (BMI 35.0-39.9 without comorbidity) Discussed reducing portion sizes, increasing physical activity  He was given a completed copy of FMLA paperwork for his spouse and we made a copy for his chart.

## 2016-12-17 NOTE — Addendum Note (Signed)
Addended by: Arnoldo Morale on: 12/17/2016 01:06 PM   Modules accepted: Orders

## 2016-12-18 ENCOUNTER — Other Ambulatory Visit: Payer: Self-pay | Admitting: Family Medicine

## 2016-12-18 MED ORDER — POTASSIUM CHLORIDE ER 10 MEQ PO TBCR: 10.0000 meq | EXTENDED_RELEASE_TABLET | Freq: Every day | ORAL | 1 refills | Status: DC

## 2016-12-18 NOTE — Progress Notes (Signed)
Patient's wife- Helene Kelp Matsuura's FMLA paperwork filled out by Dr. Jarold Song.  Copies made, original given to Helene Kelp and copy placed in a file in this RN's desk.

## 2016-12-18 NOTE — Progress Notes (Signed)
Writer changed patient's PICC drsg.  The area was dry with minimal dry blood at the insertion site.  No swelling or redness noted.  The old dsg was removed and area cleaned in a sterile procedure.  New drsg appleid with a fresh disc placed around insertion site.  Area dated and tape reinforced per patient's request. Cap replaced at the end of the catheter.

## 2016-12-25 ENCOUNTER — Ambulatory Visit: Payer: Self-pay | Attending: Family Medicine | Admitting: Family Medicine

## 2016-12-25 ENCOUNTER — Encounter: Payer: Self-pay | Admitting: Family Medicine

## 2016-12-25 VITALS — BP 153/89 | HR 82 | Temp 98.2°F | Ht 72.0 in | Wt 272.4 lb

## 2016-12-25 DIAGNOSIS — I10 Essential (primary) hypertension: Secondary | ICD-10-CM | POA: Insufficient documentation

## 2016-12-25 DIAGNOSIS — E669 Obesity, unspecified: Secondary | ICD-10-CM | POA: Insufficient documentation

## 2016-12-25 DIAGNOSIS — Z9889 Other specified postprocedural states: Secondary | ICD-10-CM | POA: Insufficient documentation

## 2016-12-25 DIAGNOSIS — I2690 Septic pulmonary embolism without acute cor pulmonale: Secondary | ICD-10-CM

## 2016-12-25 DIAGNOSIS — R918 Other nonspecific abnormal finding of lung field: Secondary | ICD-10-CM | POA: Insufficient documentation

## 2016-12-25 DIAGNOSIS — E876 Hypokalemia: Secondary | ICD-10-CM | POA: Insufficient documentation

## 2016-12-25 DIAGNOSIS — I76 Septic arterial embolism: Secondary | ICD-10-CM

## 2016-12-25 DIAGNOSIS — Z7982 Long term (current) use of aspirin: Secondary | ICD-10-CM | POA: Insufficient documentation

## 2016-12-25 DIAGNOSIS — Z79899 Other long term (current) drug therapy: Secondary | ICD-10-CM | POA: Insufficient documentation

## 2016-12-25 LAB — CBC WITH DIFFERENTIAL/PLATELET
BASOS ABS: 53 {cells}/uL (ref 0–200)
Basophils Relative: 1 %
EOS ABS: 106 {cells}/uL (ref 15–500)
Eosinophils Relative: 2 %
HEMATOCRIT: 36.1 % — AB (ref 38.5–50.0)
Hemoglobin: 11.8 g/dL — ABNORMAL LOW (ref 13.2–17.1)
LYMPHS PCT: 38 %
Lymphs Abs: 2014 cells/uL (ref 850–3900)
MCH: 27.9 pg (ref 27.0–33.0)
MCHC: 32.7 g/dL (ref 32.0–36.0)
MCV: 85.3 fL (ref 80.0–100.0)
MONO ABS: 530 {cells}/uL (ref 200–950)
MPV: 9.8 fL (ref 7.5–12.5)
Monocytes Relative: 10 %
NEUTROS PCT: 49 %
Neutro Abs: 2597 cells/uL (ref 1500–7800)
Platelets: 270 10*3/uL (ref 140–400)
RBC: 4.23 MIL/uL (ref 4.20–5.80)
RDW: 16.1 % — AB (ref 11.0–15.0)
WBC: 5.3 10*3/uL (ref 3.8–10.8)

## 2016-12-25 LAB — COMPLETE METABOLIC PANEL WITH GFR
ALT: 19 U/L (ref 9–46)
AST: 16 U/L (ref 10–40)
Albumin: 3.9 g/dL (ref 3.6–5.1)
Alkaline Phosphatase: 116 U/L — ABNORMAL HIGH (ref 40–115)
BILIRUBIN TOTAL: 0.2 mg/dL (ref 0.2–1.2)
BUN: 19 mg/dL (ref 7–25)
CHLORIDE: 101 mmol/L (ref 98–110)
CO2: 26 mmol/L (ref 20–31)
CREATININE: 1.08 mg/dL (ref 0.60–1.35)
Calcium: 9.2 mg/dL (ref 8.6–10.3)
GFR, Est African American: >89 mL/min (ref >=60)
GFR, Est Non African American: 85 mL/min (ref >=60)
Glucose, Bld: 101 mg/dL — ABNORMAL HIGH (ref 65–99)
Potassium: 3.4 mmol/L — ABNORMAL LOW (ref 3.5–5.3)
Sodium: 139 mmol/L (ref 135–146)
TOTAL PROTEIN: 7.8 g/dL (ref 6.1–8.1)

## 2016-12-25 MED ORDER — TRAMADOL HCL 50 MG PO TABS
50.0000 mg | ORAL_TABLET | Freq: Two times a day (BID) | ORAL | 0 refills | Status: DC | PRN
Start: 2016-12-25 — End: 2020-03-15

## 2016-12-25 MED ORDER — LISINOPRIL 10 MG PO TABS: 10.0000 mg | ORAL_TABLET | Freq: Every day | ORAL | 3 refills | Status: DC

## 2016-12-25 NOTE — Progress Notes (Signed)
Subjective:    Patient ID: Jeremy Hood, male    DOB: 1976-08-15, 40 y.o.   MRN: [id number]  HPI Jeremy Hood is a 41 year old male with a history of obesity, hypertension who presents for PICC line dressing change. He was hospitalized at [facility] where he was admitted from 11/23/16 through 12/05/16 for possible septic embolization secondary to multifocal cavitary pulmonary nodules/consolidations.  He had presented with pleuritic chest pain and cough, sweating and fevers, CT chest 11/24/16 revealed bilateral pulmonary nodules ? metastatic disease versus a septic emboli. Toxicology screen was positive for cocaine and marijuana.  CT chest with Lung nodules, Pulmonary consulting, septic emboli a consideration. Infectious disease was consulted. He underwent TEE on 2/7 which was negative for any vegetation; he was placed on IV antibiotics. Other workup including autoimmune workup, and serum TB cold, HIV, ACE, bronchoscopy were all negative. Myoview test was felt to be lower risk, CTE was negative for PE.  Interval history: Today he denies shortness of breath, chest pain, night sweats, fevers and has been compliant with his antibiotics which will be completed on 12/28/16.  His blood pressure is elevated despite compliance with lisinopril and chlorthalidone. He was commenced on potassium pills due to hypokalemia of 3.2 from last set of labs. He has an upcoming appointment with infectious disease on 01/05/17. 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 He does not want therapy but would like to try tai chi and meditation to help with this. Denies anxiety, depression, suicidal ideation or intents.  Past Medical History:  Diagnosis Date  . Chest pain 11/2016  . Hypertension     Past Surgical History:  Procedure Laterality Date  . ADENOIDECTOMY    .  TEE WITHOUT CARDIOVERSION N/A 11/26/2016   Procedure: TRANSESOPHAGEAL ECHOCARDIOGRAM (TEE);  Surgeon: Thayer Headings, MD;  Location: [place];  Service: Cardiovascular;  Laterality: N/A;  . VIDEO BRONCHOSCOPY Bilateral 12/02/2016   Procedure: VIDEO BRONCHOSCOPY WITHOUT FLUORO;  Surgeon: Raylene Miyamoto, MD;  Location: [place];  Service: Cardiopulmonary;  Laterality: Bilateral;    No Known Allergies  Current Outpatient Prescriptions on File Prior to Visit  Medication Sig Dispense Refill  . aspirin EC 325 MG EC tablet Take 1 tablet (325 mg total) by mouth daily. 30 tablet 3  . cefTRIAXone 2 g in dextrose 5 % 50 mL Inject 2 g into the vein daily. End Date: March 11th, 2018 30 ampule 0  . chlorthalidone (HYGROTON) 50 MG tablet Take 1 tablet (50 mg total) by mouth daily. 30 tablet 3  . cyclobenzaprine (FLEXERIL) 5 MG tablet Take 1 tablet (5 mg total) by mouth 3 (three) times daily as needed for muscle spasms. 60 tablet 3  . ferrous sulfate 325 (65 FE) MG tablet Take 1 tablet (325 mg total) by mouth 2 (two) times daily with a meal. 60 tablet 3  . metroNIDAZOLE (FLAGYL) 500 MG tablet Take 1 tablet (500 mg total) by mouth 3 (three) times daily. End Date: March 11th, 2018  90 tablet 0  . potassium chloride (KLOR-CON 10) 10 MEQ tablet Take 1 tablet (10 mEq total) by mouth daily. 30 tablet 1  . benzonatate (TESSALON) 100 MG capsule Take 1 capsule (100 mg total) by mouth 3 (three) times daily. For cough (Patient not taking: Reported on 12/25/2016) 30 capsule 0  . chlorpheniramine-HYDROcodone (TUSSIONEX) 10-8 MG/5ML  SUER Take 5 mLs by mouth every 12 (twelve) hours as needed for cough. (Patient not taking: Reported on 12/10/2016) 115 mL 0   No current facility-administered medications on file prior to visit.      Review of Systems Constitutional: Negative for activity change and appetite change.  HENT: Negative for sinus pressure and sore throat.   Eyes: Negative for visual disturbance.    Respiratory: Negative for cough, chest tightness and shortness of breath.   Cardiovascular: Negative for chest pain and leg swelling.  Gastrointestinal: Negative for abdominal distention, abdominal pain, constipation and diarrhea.  Endocrine: Negative.   Genitourinary: Negative for dysuria.  Musculoskeletal: Negative for joint swelling and myalgias.  Skin: Negative for rash.  Allergic/Immunologic: Negative.   Neurological: Negative for weakness, light-headedness and numbness.  Psychiatric/Behavioral: Negative for dysphoric mood and suicidal ideas; gets angry easily    Objective: Vitals:   12/25/16 1115  BP: (!) 153/89  Pulse: 82  Temp: 98.2 F (36.8 C)  TempSrc: Oral  SpO2: 97%  Weight: 272 lb 6.4 oz (123.6 kg)  Height: 6' (1.829 m)      Physical Exam Constitutional: He is oriented to person, place, and time. He appears well-developed and well-nourished.  Obese  Cardiovascular: Normal rate, normal heart sounds and intact distal pulses.   No murmur heard. Pulmonary/Chest: Effort normal and breath sounds normal. He has no wheezes. He has no rales. He exhibits no tenderness.  Abdominal: Soft. Bowel sounds are normal. He exhibits no distension and no mass. There is no tenderness.  Musculoskeletal: Normal range of motion.  PICC line in right forearm, slight bleeding at site of insertion  Neurological: He is alert and oriented to person, place, and time.  Psych: normal  CMP Latest Ref Rng & Units 12/17/2016 12/10/2016 12/05/2016  Glucose 65 - 99 mg/dL 122(H) 113(H) 128(H)  BUN 7 - 25 mg/dL 17 18 22(H)  Creatinine 0.60 - 1.35 mg/dL 1.26 1.31 1.32(H)  Sodium 135 - 146 mmol/L 139 140 136  Potassium 3.5 - 5.3 mmol/L 3.2(L) 3.7 3.6  Chloride 98 - 110 mmol/L 100 102 99(L)  CO2 20 - 31 mmol/L '26 28 26  '$ Calcium 8.6 - 10.3 mg/dL 9.2 9.0 9.5  Total Protein 6.1 - 8.1 g/dL 7.4 7.4 -  Total Bilirubin 0.2 - 1.2 mg/dL 0.2 0.2 -  Alkaline Phos 40 - 115 U/L 84 110 -  AST 10 - 40 U/L 40 19 -   ALT 9 - 46 U/L 49(H) 30 -    CBC    Component Value Date/Time   WBC 6.6 12/17/2016 1202   RBC 4.07 (L) 12/17/2016 1202   HGB 11.4 (L) 12/17/2016 1202   HCT 35.2 (L) 12/17/2016 1202   PLT 314 12/17/2016 1202   MCV 86.5 12/17/2016 1202   MCH 28.0 12/17/2016 1202   MCHC 32.4 12/17/2016 1202   RDW 15.9 (H) 12/17/2016 1202   LYMPHSABS 1,716 12/17/2016 1202   MONOABS 660 12/17/2016 1202   EOSABS 132 12/17/2016 1202   BASOSABS 66 12/17/2016 1202         Assessment & Plan:  1. Abnormal chest x-ray with multiple lung nodules ?Septic emboli Asymptomatic - CBC with Differential/Platelet Chest x-ray to evaluate for resolution PICC dressing change performed in the clinic Continue IV antibiotics until 12/28/16 Keep appointment with infectious disease on 01/05/17  2. Essential hypertension Uncontrolled Increased dose of lisinopril from 5 mg to 10 mg Continue chlorthalidone Low-sodium, DASH diet  3. Obesity (BMI 35.0-39.9 without comorbidity) Discussed reducing  portion sizes, increasing physical activity  4. Hypokalemia Likely from chlorthalidone Continue potassium Hopefully increase in dose of lisinopril should curtail this. He was given a completed copy of FMLA paperwork for his spouse and we made a copy for his chart.  We have discussed anger management and the fact that he may need therapy which he is declining at this time. He is also declining medications and will work on other coping mechanisms like tai chi.

## 2016-12-25 NOTE — Progress Notes (Signed)
Refills on tramadol

## 2016-12-25 NOTE — Progress Notes (Signed)
Writer changed patient's PICC dsg which was dry and intact.  No redness or swelling found around the insertion site.   Patient denies any pain.

## 2017-01-02 ENCOUNTER — Ambulatory Visit: Payer: Self-pay | Admitting: Family Medicine

## 2017-01-05 ENCOUNTER — Ambulatory Visit (HOSPITAL_COMMUNITY)
Admission: RE | Admit: 2017-01-05 | Discharge: 2017-01-05 | Disposition: A | Discharge status: Home / Self Care | Payer: Self-pay | Source: Ambulatory Visit | Attending: Family Medicine | Admitting: Family Medicine

## 2017-01-05 ENCOUNTER — Ambulatory Visit: Payer: Self-pay | Attending: Family Medicine | Admitting: Family Medicine

## 2017-01-05 ENCOUNTER — Encounter: Payer: Self-pay | Admitting: Family Medicine

## 2017-01-05 ENCOUNTER — Encounter: Payer: Self-pay | Admitting: Internal Medicine

## 2017-01-05 ENCOUNTER — Ambulatory Visit (INDEPENDENT_AMBULATORY_CARE_PROVIDER_SITE_OTHER): Payer: Self-pay | Admitting: Internal Medicine

## 2017-01-05 VITALS — BP 138/84 | HR 79 | Temp 98.2°F | Ht 72.0 in | Wt 279.6 lb

## 2017-01-05 VITALS — BP 145/88 | HR 90 | Temp 98.1°F | Ht 72.0 in | Wt 279.0 lb

## 2017-01-05 DIAGNOSIS — I2690 Septic pulmonary embolism without acute cor pulmonale: Secondary | ICD-10-CM

## 2017-01-05 DIAGNOSIS — I10 Essential (primary) hypertension: Secondary | ICD-10-CM

## 2017-01-05 DIAGNOSIS — R918 Other nonspecific abnormal finding of lung field: Secondary | ICD-10-CM | POA: Insufficient documentation

## 2017-01-05 DIAGNOSIS — E876 Hypokalemia: Secondary | ICD-10-CM

## 2017-01-05 DIAGNOSIS — I76 Septic arterial embolism: Secondary | ICD-10-CM

## 2017-01-05 DIAGNOSIS — Z452 Encounter for adjustment and management of vascular access device: Secondary | ICD-10-CM | POA: Insufficient documentation

## 2017-01-05 DIAGNOSIS — Z720 Tobacco use: Secondary | ICD-10-CM

## 2017-01-05 DIAGNOSIS — Z79899 Other long term (current) drug therapy: Secondary | ICD-10-CM | POA: Insufficient documentation

## 2017-01-05 DIAGNOSIS — J984 Other disorders of lung: Secondary | ICD-10-CM

## 2017-01-05 DIAGNOSIS — Z9889 Other specified postprocedural states: Secondary | ICD-10-CM | POA: Insufficient documentation

## 2017-01-05 DIAGNOSIS — E669 Obesity, unspecified: Secondary | ICD-10-CM | POA: Insufficient documentation

## 2017-01-05 DIAGNOSIS — Z95828 Presence of other vascular implants and grafts: Secondary | ICD-10-CM

## 2017-01-05 DIAGNOSIS — Z7982 Long term (current) use of aspirin: Secondary | ICD-10-CM | POA: Insufficient documentation

## 2017-01-05 MED ORDER — AMOXICILLIN-POT CLAVULANATE 875-125 MG PO TABS
1.0000 | ORAL_TABLET | Freq: Two times a day (BID) | ORAL | 0 refills | Status: DC
Start: 2017-01-05 — End: 2020-03-15

## 2017-01-05 MED ORDER — ONDANSETRON HCL 4 MG PO TABS: 4.0000 mg | ORAL_TABLET | Freq: Three times a day (TID) | ORAL | 0 refills | Status: DC | PRN

## 2017-01-05 NOTE — Progress Notes (Signed)
Subjective:  Patient ID: Jeremy Hood, male    DOB: 06/14/1976  Age: 41 y.o. MRN: [id number]  CC: PICC removal   HPI Jeremy Hood is a 41 year old male with a history of obesity, hypertension who presents for PICC line removal after completion of the course of IV metronidazole and ceftriaxone for possible septic embolization secondary to multifocal cavitary pulmonary nodules/consolidations.  He was hospitalized at [facility] from 11/23/16 through 12/05/16 after had presented with pleuritic chest pain and cough, sweating and fevers, CT chest 11/24/16 revealed bilateral pulmonary nodules ? metastatic disease versus a septic emboli. Toxicology screen was positive for cocaine and marijuana.  CT chest with Lung nodules, Pulmonary consulting, septic emboli a consideration. Infectious disease was consulted. He underwent TEE on 2/7 which was negative for any vegetation; he was placed on IV antibiotics. Other workup including autoimmune workup, and serum TB cold, HIV, ACE, bronchoscopy were all negative. Myoview test was felt to be lower risk, CTE was negative for PE.  Interval history: Today he denies shortness of breath, chest pain, night sweats, fevers and his blood pressure is better controlled compared to his last visit after his dose of lisinopril was increased. He was referred for chest x-ray at his last visit to evaluate for resolution of lung nodules however he never made there. He has no additional concerns today.  Past Medical History:  Diagnosis Date  . Chest pain 11/2016  . Hypertension     Past Surgical History:  Procedure Laterality Date  . ADENOIDECTOMY    . TEE WITHOUT CARDIOVERSION N/A 11/26/2016   Procedure: TRANSESOPHAGEAL ECHOCARDIOGRAM (TEE);  Surgeon: Thayer Headings, MD;  Location: [place];  Service: Cardiovascular;  Laterality: N/A;  . VIDEO BRONCHOSCOPY Bilateral 12/02/2016   Procedure: VIDEO BRONCHOSCOPY WITHOUT FLUORO;  Surgeon:  Raylene Miyamoto, MD;  Location: [place];  Service: Cardiopulmonary;  Laterality: Bilateral;    No Known Allergies   Outpatient Medications Prior to Visit  Medication Sig Dispense Refill  . aspirin EC 325 MG EC tablet Take 1 tablet (325 mg total) by mouth daily. 30 tablet 3  . chlorthalidone (HYGROTON) 50 MG tablet Take 1 tablet (50 mg total) by mouth daily. 30 tablet 3  . cyclobenzaprine (FLEXERIL) 5 MG tablet Take 1 tablet (5 mg total) by mouth 3 (three) times daily as needed for muscle spasms. 60 tablet 3  . ferrous sulfate 325 (65 FE) MG tablet Take 1 tablet (325 mg total) by mouth 2 (two) times daily with a meal. 60 tablet 3  . lisinopril (PRINIVIL,ZESTRIL) 10 MG tablet Take 1 tablet (10 mg total) by mouth daily. 30 tablet 3  . potassium chloride (KLOR-CON 10) 10 MEQ tablet Take 1 tablet (10 mEq total) by mouth daily. 30 tablet 1  . traMADol (ULTRAM) 50 MG tablet Take 1 tablet (50 mg total) by mouth every 12 (twelve) hours as needed for moderate pain or severe pain. 40 tablet 0  . benzonatate (TESSALON) 100 MG capsule Take 1 capsule (100 mg total) by mouth 3 (three) times daily. For cough (Patient not taking: Reported on 12/25/2016) 30 capsule 0  . chlorpheniramine-HYDROcodone (TUSSIONEX) 10-8 MG/5ML SUER Take 5 mLs by mouth every 12 (twelve) hours as needed for cough. (Patient not taking: Reported on 12/10/2016) 115 mL 0  . metroNIDAZOLE (FLAGYL) 500 MG tablet Take 1 tablet (500 mg total) by mouth 3 (three) times daily. End Date: March 11th, 2018  (Patient not taking: Reported on 01/05/2017) 90 tablet  0   No facility-administered medications prior to visit.     ROS Review of Systems Constitutional: Negative for activity change and appetite change.  HENT: Negative for sinus pressure and sore throat.   Eyes: Negative for visual disturbance.  Respiratory: Negative for cough, chest tightness and shortness of breath.   Cardiovascular: Negative for chest pain and leg swelling.    Gastrointestinal: Negative for abdominal distention, abdominal pain, constipation and diarrhea.  Endocrine: Negative.   Genitourinary: Negative for dysuria.  Musculoskeletal: Negative for joint swelling and myalgias.  Skin: Negative for rash.  Allergic/Immunologic: Negative.   Neurological: Negative for weakness, light-headedness and numbness.  Psychiatric/Behavioral: Negative for dysphoric mood and suicidal ideas; gets angry easily  Objective:  BP 138/84 (BP Location: Left Arm, Cuff Size: Large)   Pulse 79   Temp 98.2 F (36.8 C) (Oral)   Ht 6' (1.829 m)   Wt 279 lb 9.6 oz (126.8 kg)   SpO2 99%   BMI 37.92 kg/m   BP/Weight 01/05/2017 12/25/2016 02/11/9562  Systolic BP 875 643 329  Diastolic BP 84 89 518  Wt. (Lbs) 279.6 272.4 269.8  BMI 37.92 36.94 36.59      Physical Exam Constitutional: He is oriented to person, place, and time. He appears well-developed and well-nourished.  Obese  Cardiovascular: Normal rate, normal heart sounds and intact distal pulses.   No murmur heard. Pulmonary/Chest: Effort normal and breath sounds normal. He has no wheezes. He has no rales. He exhibits no tenderness.  Abdominal: Soft. Bowel sounds are normal. He exhibits no distension and no mass. There is no tenderness.  Musculoskeletal: Normal range of motion.  PICC line in right forearm  Neurological: He is alert and oriented to person, place, and time.  Psych: normal  CMP Latest Ref Rng & Units 12/25/2016 12/17/2016 12/10/2016  Glucose 65 - 99 mg/dL 101(H) 122(H) 113(H)  BUN 7 - 25 mg/dL 19 17 18   Creatinine 0.60 - 1.35 mg/dL 1.08 1.26 1.31  Sodium 135 - 146 mmol/L 139 139 140  Potassium 3.5 - 5.3 mmol/L 3.4(L) 3.2(L) 3.7  Chloride 98 - 110 mmol/L 101 100 102  CO2 20 - 31 mmol/L 26 26 28   Calcium 8.6 - 10.3 mg/dL 9.2 9.2 9.0  Total Protein 6.1 - 8.1 g/dL 7.8 7.4 7.4  Total Bilirubin 0.2 - 1.2 mg/dL 0.2 0.2 0.2  Alkaline Phos 40 - 115 U/L 116(H) 84 110  AST 10 - 40 U/L 16 40 19  ALT 9 -  46 U/L 19 49(H) 30    CBC Latest Ref Rng & Units 12/25/2016 12/17/2016 12/10/2016  WBC 3.8 - 10.8 K/uL 5.3 6.6 10.0  Hemoglobin 13.2 - 17.1 g/dL 11.8(L) 11.4(L) 10.4(L)  Hematocrit 38.5 - 50.0 % 36.1(L) 35.2(L) 32.5(L)  Platelets 140 - 400 K/uL 270 314 527(H)    Assessment & Plan:   1. Septic embolism ([place]) Completed course of antibiotic Scheduled to see infectious disease later today Referred for chest x-ray at his last visit which is still pending-patient encouraged to go over to the hospital to have this done today  2. S/P PICC central line placement PICC line removed and patient tolerated procedure well; blood pressure normotensive after procedure. - Cath Tip Culture  3. Essential hypertension Controlled Continue lisinopril and chlorthalidone  4. Hypokalemia Last potassium was 3.4 Expect improvement given lisinopril dose was increased at his last visit Would love to check levels today however he declines.   No orders of the defined types were placed in this encounter.  Follow-up: Return in about 3 months (around 04/07/2017) for follow up on Hypertension.   Arnoldo Morale MD

## 2017-01-05 NOTE — Progress Notes (Signed)
RFV: hospital follow up for multifocal pna   Patient ID: Jeremy Hood, male   DOB: 08-Oct-1976, 41 y.o.   MRN: [id number]  HPI  Luby was hospitalized in early February for chest pain, productive cough found to have multifocal cavitary lung disease thought to be due to odontogenic infection. His blood cx were NGTD, and both TTE and TEE ruled out endocarditis. He was discharged on 4 wk of amp/sub which ended on 3/18, and had picc line pulled on 3/19. His repeat cxr today shows improvement but not complete resolution per my read.  He states that he has stopped smoking since having pneumonia  I have reviewed his records in [place] link & done med reconciliation  Outpatient Encounter Prescriptions as of 01/05/2017  Medication Sig  . aspirin EC 325 MG EC tablet Take 1 tablet (325 mg total) by mouth daily.  . chlorthalidone (HYGROTON) 50 MG tablet Take 1 tablet (50 mg total) by mouth daily.  . cyclobenzaprine (FLEXERIL) 5 MG tablet Take 1 tablet (5 mg total) by mouth 3 (three) times daily as needed for muscle spasms.  . ferrous sulfate 325 (65 FE) MG tablet Take 1 tablet (325 mg total) by mouth 2 (two) times daily with a meal.  . lisinopril (PRINIVIL,ZESTRIL) 10 MG tablet Take 1 tablet (10 mg total) by mouth daily.  . metroNIDAZOLE (FLAGYL) 500 MG tablet Take 1 tablet (500 mg total) by mouth 3 (three) times daily. End Date: March 11th, 2018   . potassium chloride (KLOR-CON 10) 10 MEQ tablet Take 1 tablet (10 mEq total) by mouth daily.  . traMADol (ULTRAM) 50 MG tablet Take 1 tablet (50 mg total) by mouth every 12 (twelve) hours as needed for moderate pain or severe pain.  . [DISCONTINUED] benzonatate (TESSALON) 100 MG capsule Take 1 capsule (100 mg total) by mouth 3 (three) times daily. For cough (Patient not taking: Reported on 12/25/2016)  . [DISCONTINUED] chlorpheniramine-HYDROcodone (TUSSIONEX) 10-8 MG/5ML SUER Take 5 mLs by mouth every 12 (twelve) hours as needed for cough.  (Patient not taking: Reported on 12/10/2016)   No facility-administered encounter medications on file as of 01/05/2017.      Patient Active Problem List   Diagnosis Date Noted  . Anemia 12/17/2016  . Obesity (BMI 35.0-39.9 without comorbidity) 12/10/2016  . Acute respiratory failure with hypoxia ([place])   . Loosening of tooth   . Cavitary lung disease   . Dental abscess   . Abnormal chest x-ray with multiple lung nodules   . Liver lesion   . Tobacco abuse   . Cocaine abuse   . Lung mass 11/26/2016  . Substance abuse, coacaine and marijuana  11/26/2016  . Cough   . Septic embolism ([place])   . Chest pain 11/23/2016  . Costochondritis 11/23/2016  . HTN (hypertension) 11/23/2016     Health Maintenance Due  Topic Date Due  . Samul Dada  11/30/1995    Social History  Substance Use Topics  . Smoking status: Former Smoker    Packs/day: 0.50    Years: 20.00    Types: Cigarettes    Quit date: 11/23/2016  . Smokeless tobacco: Never Used  . Alcohol use No     Comment: last drink in january 2017    Review of Systems 10 point ros is negative. Still has some fatigue,but denies fever, chills, nightsweats. He has occ. cough or DOE.  Physical Exam   BP (!) 145/88   Pulse 90   Temp 98.1 F (36.7 C) (  Oral)   Ht 6' (1.829 m)   Wt 279 lb (126.6 kg)   BMI 37.84 kg/m   Physical Exam  Constitutional: He is oriented to person, place, and time. He appears well-developed and well-nourished. No distress.  HENT:  Mouth/Throat: Oropharynx is clear and moist. No oropharyngeal exudate.  Cardiovascular: Normal rate, regular rhythm and normal heart sounds. Exam reveals no gallop and no friction rub.  No murmur heard.  Pulmonary/Chest: Effort normal and breath sounds normal. No respiratory distress. He has no wheezes.  Abdominal: Soft. Bowel sounds are normal. He exhibits no distension. There is no tenderness.  Lymphadenopathy:  He has no cervical adenopathy.  Neurological: He is alert and  oriented to person, place, and time.  Skin: Skin is warm and dry. No rash noted. No erythema.  Psychiatric: He has a normal mood and affect. His behavior is normal.    CBC Lab Results  Component Value Date   WBC 5.3 12/25/2016   RBC 4.23 12/25/2016   HGB 11.8 (L) 12/25/2016   HCT 36.1 (L) 12/25/2016   PLT 270 12/25/2016   MCV 85.3 12/25/2016   MCH 27.9 12/25/2016   MCHC 32.7 12/25/2016   RDW 16.1 (H) 12/25/2016   LYMPHSABS 2,014 12/25/2016   MONOABS 530 12/25/2016   EOSABS 106 12/25/2016    BMET Lab Results  Component Value Date   NA 139 12/25/2016   K 3.4 (L) 12/25/2016   CL 101 12/25/2016   CO2 26 12/25/2016   GLUCOSE 101 (H) 12/25/2016   BUN 19 12/25/2016   CREATININE 1.08 12/25/2016   CALCIUM 9.2 12/25/2016   GFRNONAA 85 12/25/2016   GFRAA >89 12/25/2016   Lab Results  Component Value Date   ESRSEDRATE 16 (H) 01/05/2017   Lab Results  Component Value Date   CRP 7.6 01/05/2017     Assessment and Plan  Multifocal pneumonia = he was treated for extend IV abtx for 4 wk for cavitary lung disease. Will treat with 2 addn weeks of oral amox/clav. Will check his sed rate and crp. He can discontinue metronidazole.  Smoking cessation = encouraged to abstain from smoking. He has made great progress thus far  Hypertension = BP at this visit slightly elevated, but he mentions that he has not taking his anti-hypertensive yet. Will not change meds at this visit. Ask to follow up with pcp

## 2017-01-06 LAB — C-REACTIVE PROTEIN: CRP: 7.6 mg/L (ref <8.0)

## 2017-01-06 LAB — SEDIMENTATION RATE: Sed Rate: 16 mm/hr — ABNORMAL HIGH (ref 0–15)

## 2017-01-09 LAB — CATH TIP CULTURE: Organism ID, Bacteria: NO GROWTH

## 2017-01-26 ENCOUNTER — Ambulatory Visit: Payer: Self-pay | Admitting: Family Medicine

## 2017-01-29 ENCOUNTER — Ambulatory Visit: Payer: Self-pay | Admitting: Internal Medicine

## 2017-02-19 ENCOUNTER — Ambulatory Visit: Payer: Self-pay | Admitting: Internal Medicine

## 2017-02-24 ENCOUNTER — Ambulatory Visit: Payer: Self-pay | Admitting: Internal Medicine

## 2017-03-20 NOTE — Addendum Note (Signed)
Addendum  created 03/20/17 1043 by Rica Koyanagi, MD   Sign clinical note

## 2017-03-27 ENCOUNTER — Ambulatory Visit: Payer: Self-pay | Admitting: Family Medicine

## 2017-11-16 IMAGING — CT CT CHEST W/O CM
2 of 4 series · 15 of 36 positions shown, 18 images · non-contrast
Comparison: 11/24/2016.

CLINICAL DATA: Pulmonary nodules.  Cough.

EXAM:
CT CHEST WITHOUT CONTRAST
TECHNIQUE: Multidetector CT imaging of the chest was performed following the
standard protocol without IV contrast.

[Series 3: thorax 2.0 · axial · 0.78mm/px · z∈[+1323,+1565]mm · 12 of 137 slices shown, 15 images]
[im 8/137  mediastinal]
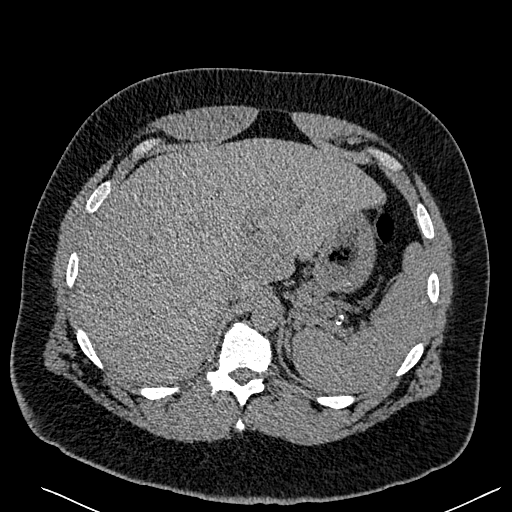
[im 8/137  lung]
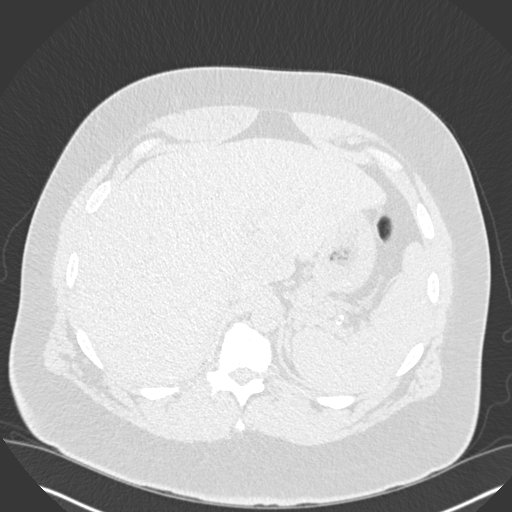
[im 22/137  lung]
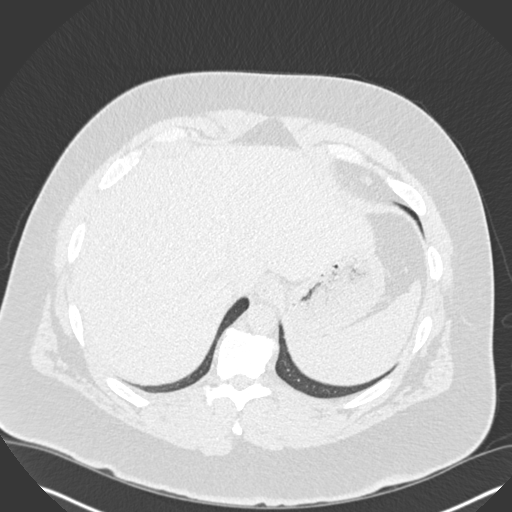
[im 29/137  lung]
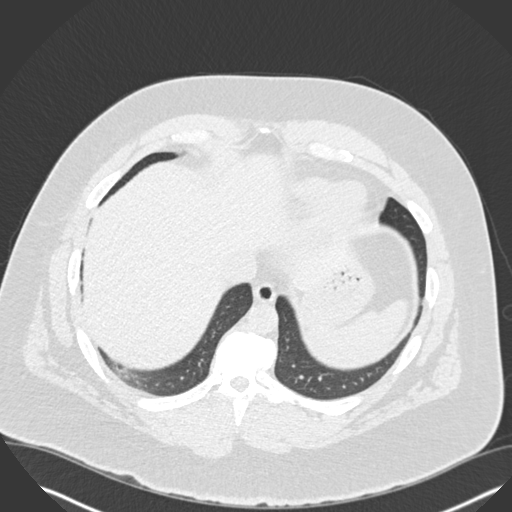
[im 43/137  lung]
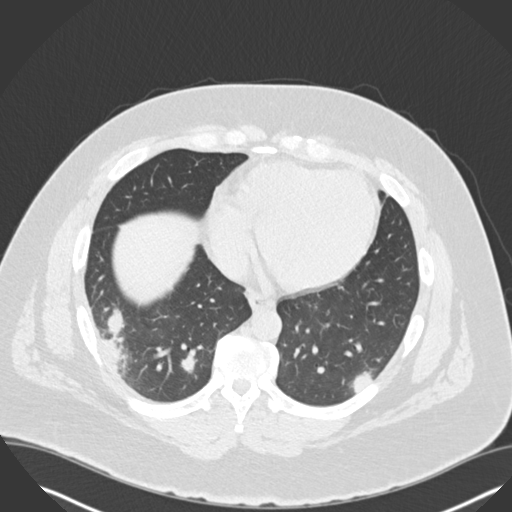
[im 51/137  mediastinal]
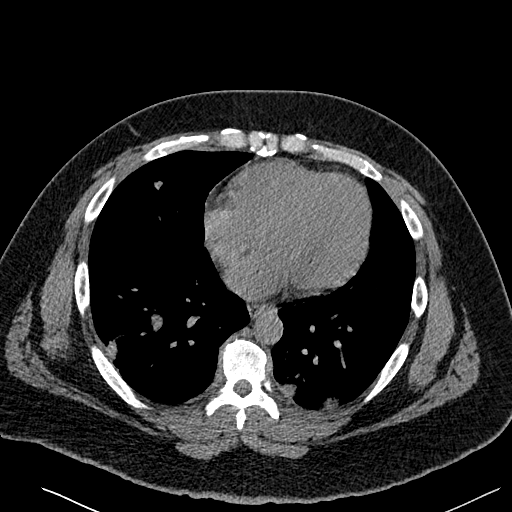
[im 51/137  lung]
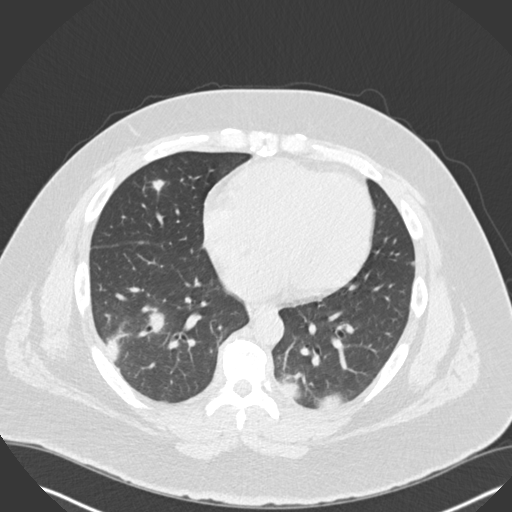
[im 65/137  lung]
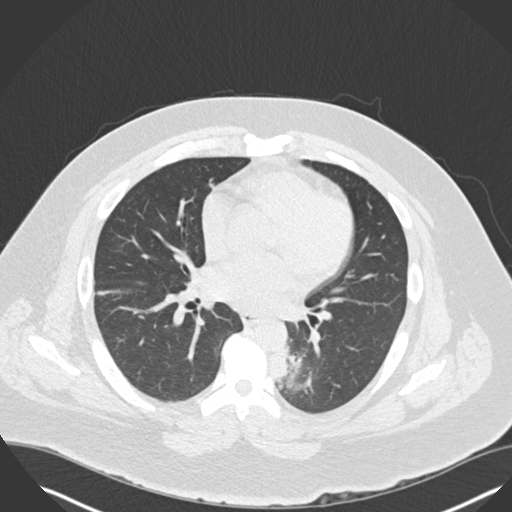
[im 72/137  lung]
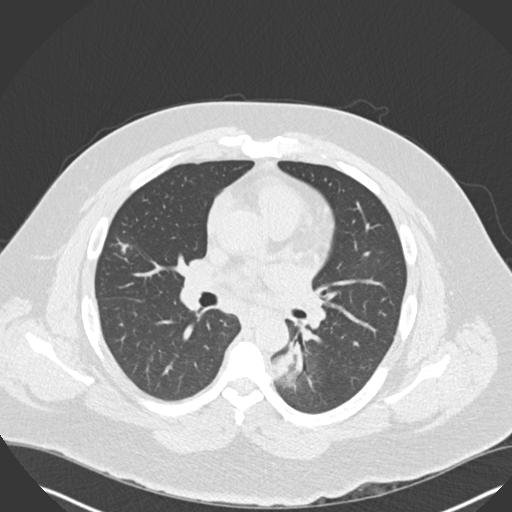
[im 86/137  lung]
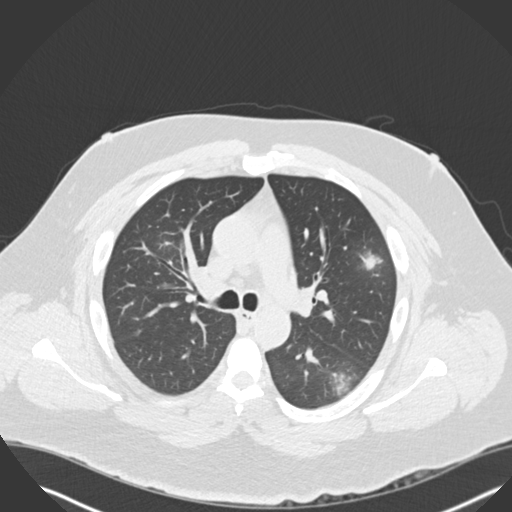
[im 94/137  mediastinal]
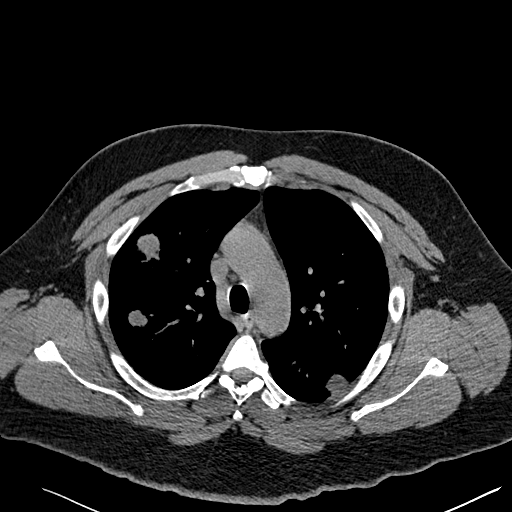
[im 94/137  lung]
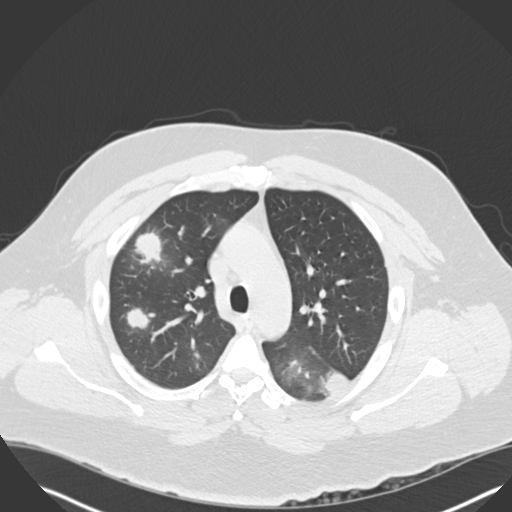
[im 108/137  lung]
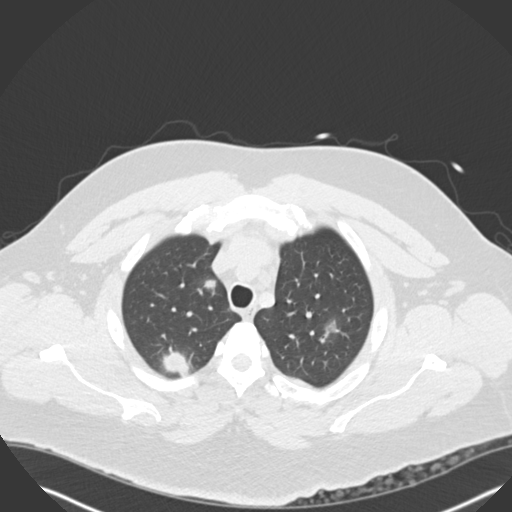
[im 115/137  lung]
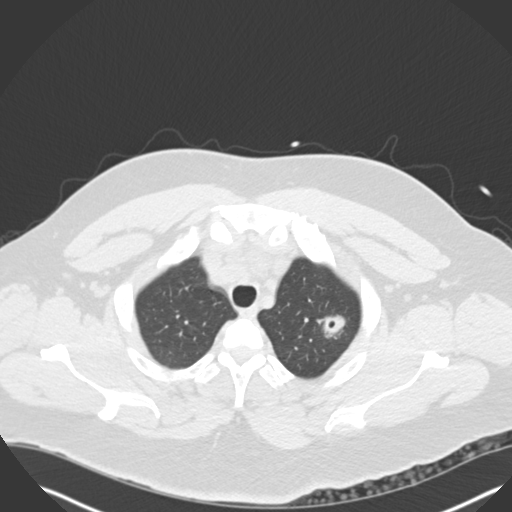
[im 129/137  lung]
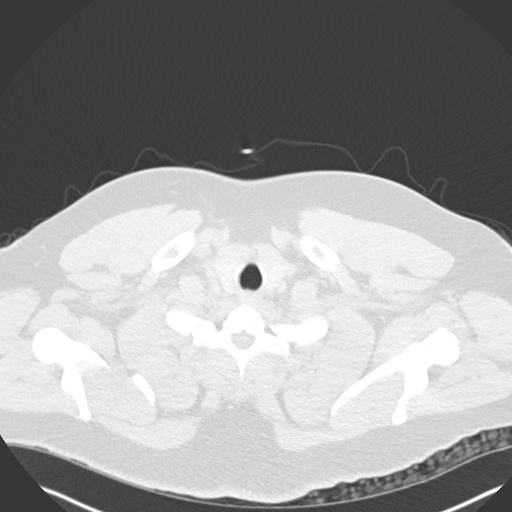

[Series 5: coronal · coronal · 0.52mm/px · 3 of 101 slices shown]
[im 21/101  lung]
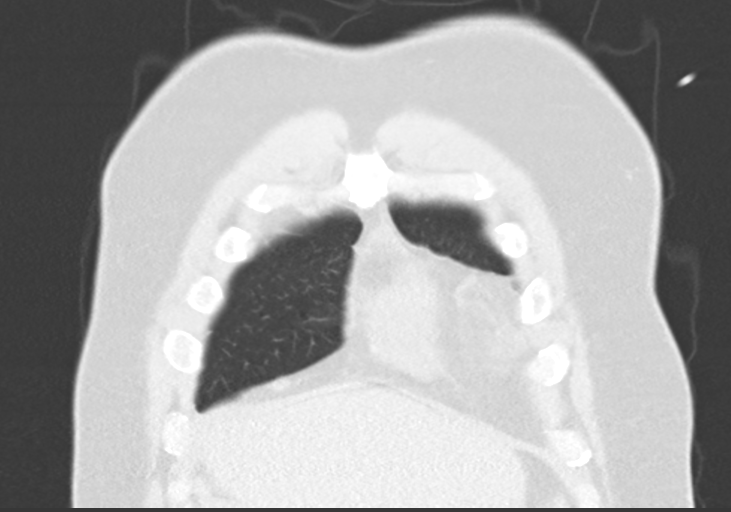
[im 41/101  lung]
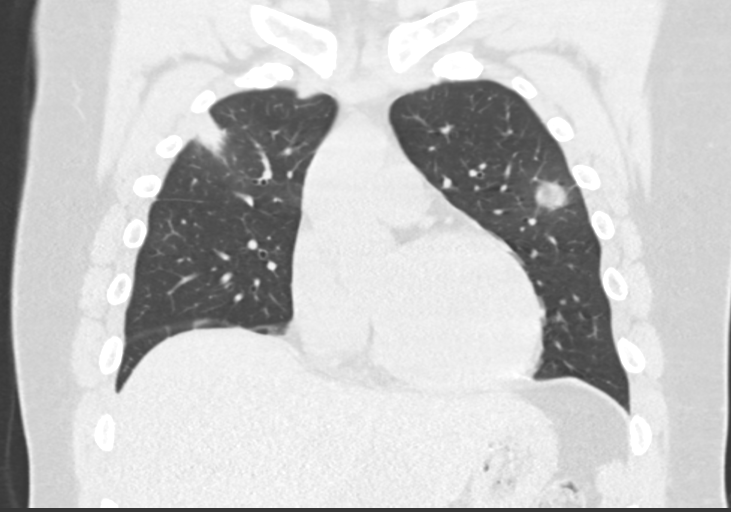
[im 61/101  lung]
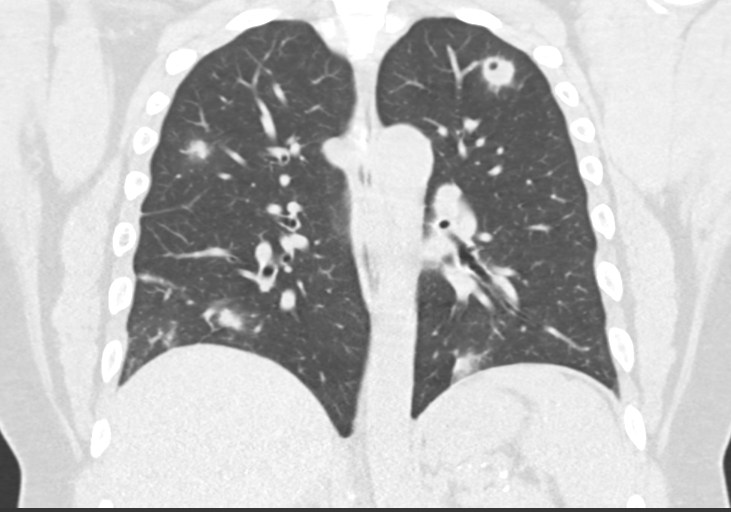

[15 of 36 positions shown; findings below may reference images not displayed]

FINDINGS: Cardiovascular: Vascular structures are unremarkable. Heart size
normal. No pericardial effusion.

Mediastinum/Nodes: Mediastinal lymph nodes are not enlarged by CT
size criteria. Hilar regions are difficult to evaluate without IV
contrast. No axillary adenopathy. Esophagus is grossly unremarkable.

Lungs/Pleura: New and enlarging rounded areas of consolidation with
surrounding ground-glass are seen bilaterally. Some are now
cavitary, for example 2 lesions in the left upper lobe. No pleural
fluid. Airway is unremarkable.

Upper Abdomen: Visualized portions of the liver, adrenal glands,
right kidney, spleen, pancreas, stomach and bowel are grossly
unremarkable.

Musculoskeletal: No worrisome lytic or sclerotic lesions.
IMPRESSION: New and enlarging areas of rounded consolidation with surrounding
ground-glass and developing cavitation, most indicative of septic
emboli.

## 2017-11-19 IMAGING — CR DG CHEST 2V
2 series · 2 of 2 positions shown · non-contrast
Comparison: Chest x-ray and chest CT scan December 01, 2016

CLINICAL DATA: Acute respiratory failure with hypoxia

EXAM:
CHEST  2 VIEW

[chest pa]
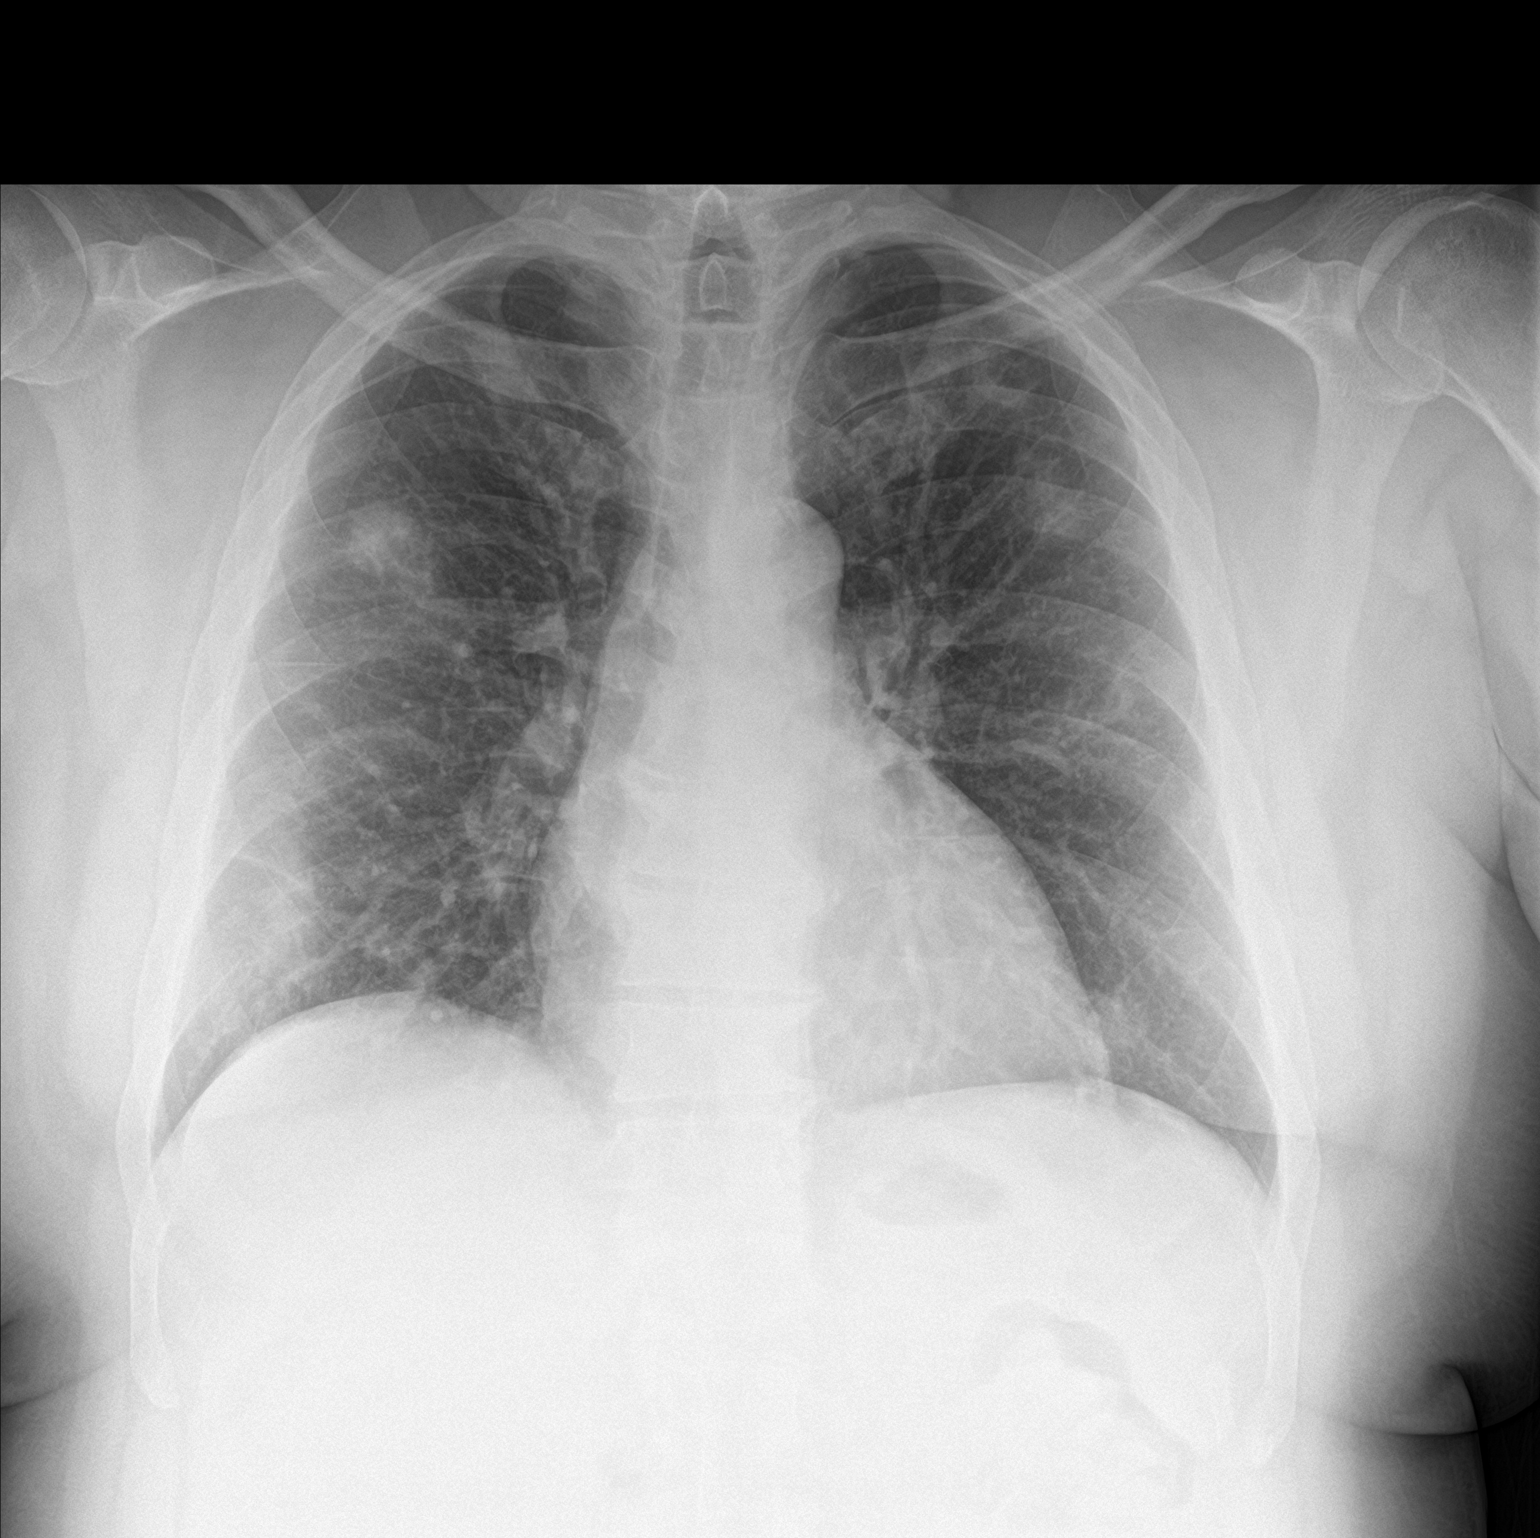

[chest lat]
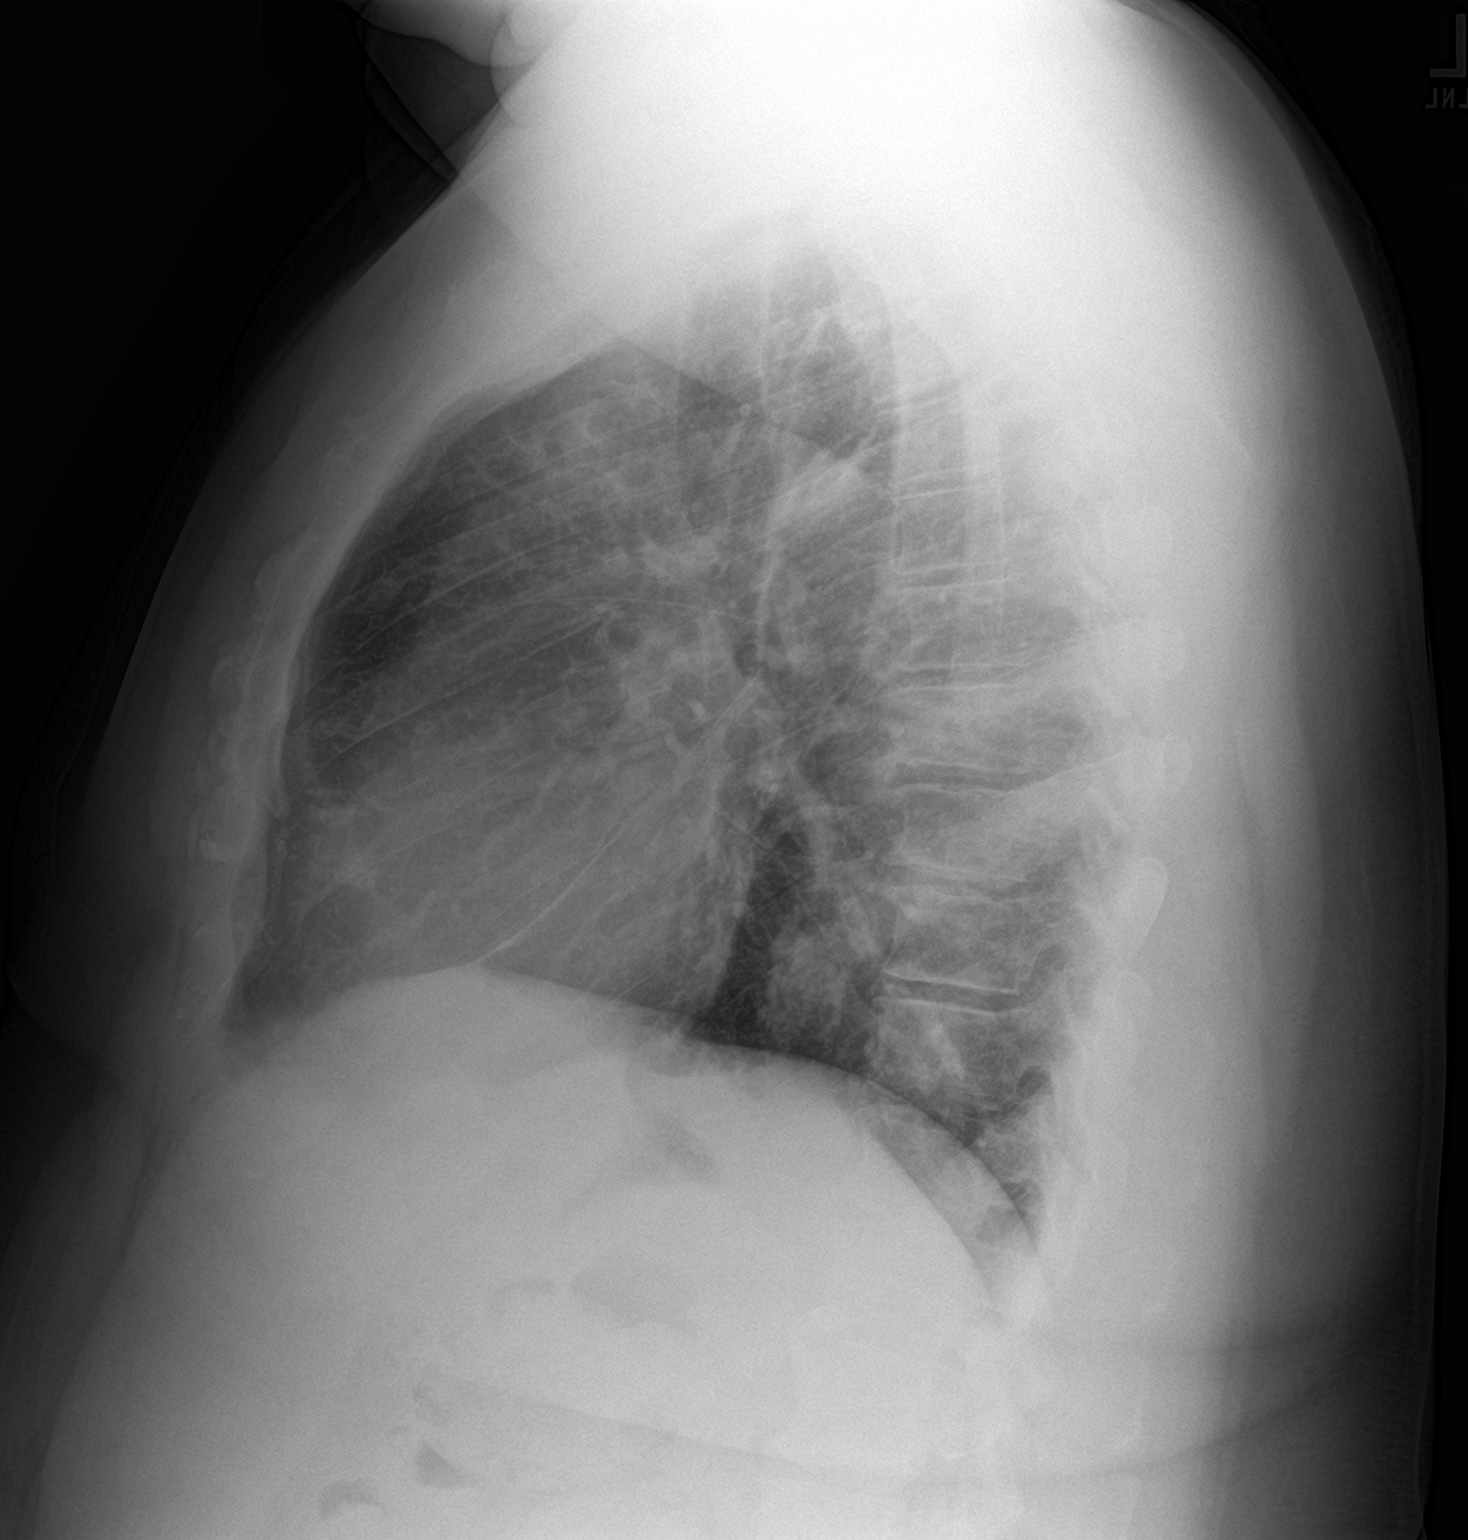

[2 of 2 positions shown; findings below may reference images not displayed]

FINDINGS: The lungs are well-expanded. Again demonstrated are multiple soft
tissue density in cavitary appearing masses in the lungs. These are
slightly smaller today. There is no pleural effusion. The heart is
top-normal in size. The pulmonary vascularity is not engorged. The
trachea is midline. The bony thorax exhibits no acute abnormality.
IMPRESSION: Mild interval decrease in the size of the multiple solid and
cavitary pulmonary nodules consistent with response to therapy.
Continued follow-up chest radiographs are recommended to assure
complete resolution.

## 2019-05-03 ENCOUNTER — Other Ambulatory Visit: Payer: Self-pay | Admitting: *Deleted

## 2019-05-03 DIAGNOSIS — Z20822 Contact with and (suspected) exposure to covid-19: Secondary | ICD-10-CM

## 2019-05-08 LAB — NOVEL CORONAVIRUS, NAA: SARS-CoV-2, NAA: NOT DETECTED

## 2020-03-15 ENCOUNTER — Encounter: Payer: Self-pay | Admitting: Emergency Medicine

## 2020-03-15 ENCOUNTER — Ambulatory Visit
Admission: EM | Admit: 2020-03-15 | Discharge: 2020-03-15 | Disposition: A | Discharge status: Home / Self Care | Payer: Self-pay | Attending: Emergency Medicine | Admitting: Emergency Medicine

## 2020-03-15 ENCOUNTER — Other Ambulatory Visit: Payer: Self-pay

## 2020-03-15 DIAGNOSIS — I10 Essential (primary) hypertension: Secondary | ICD-10-CM

## 2020-03-15 DIAGNOSIS — M654 Radial styloid tenosynovitis [de Quervain]: Secondary | ICD-10-CM

## 2020-03-15 MED ORDER — PREDNISONE 20 MG PO TABS
20.0000 mg | ORAL_TABLET | Freq: Every day | ORAL | 0 refills | Status: DC
Start: 2020-03-15 — End: 2020-09-05

## 2020-03-15 MED ORDER — LISINOPRIL 10 MG PO TABS: 10.0000 mg | ORAL_TABLET | Freq: Every day | ORAL | 0 refills | Status: DC

## 2020-03-15 NOTE — Discharge Instructions (Signed)
Recommend RICE: rest, ice, compression, elevation as needed for pain.    Heat therapy (hot compress, warm wash rag, hot showers, etc.) can help relax muscles and soothe muscle aches. Cold therapy (ice packs) can be used to help swelling both after injury and after prolonged use of areas of chronic pain/aches.  For pain: Take steroid once daily with breakfast.  May take Tylenol additionally if needed.

## 2020-03-15 NOTE — ED Provider Notes (Addendum)
[name]    CSN: [id number] Arrival date & time: 03/15/20  1626      History   Chief Complaint Chief Complaint  Patient presents with  . Wrist Pain    HPI Jeremy Hood is a 44 y.o. male with history of hypertension presenting for left wrist pain and swelling.  Denies trauma or inciting event.  Patient unsure if he slept on it wrong.  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  Patient denies deformity, numbness, discoloration.  Has not anything for this.  Ago, patient's blood pressure is elevated: Has been out of lisinopril "for years ".  Tolerated this well previously.  No headache, change in vision, chest pain, difficulty breathing, abdominal pain, lower extremity edema.   Past Medical History:  Diagnosis Date  . Chest pain 11/2016  . Hypertension     Patient Active Problem List   Diagnosis Date Noted  . Anemia 12/17/2016  . Obesity (BMI 35.0-39.9 without comorbidity) 12/10/2016  . Acute respiratory failure with hypoxia ([place])   . Loosening of tooth   . Cavitary lung disease   . Dental abscess   . Abnormal chest x-ray with multiple lung nodules   . Liver lesion   . Tobacco abuse   . Cocaine abuse ([place])   . Lung mass 11/26/2016  . Substance abuse, coacaine and marijuana  11/26/2016  . Cough   . Septic embolism ([place])   . Chest pain 11/23/2016  . Costochondritis 11/23/2016  . HTN (hypertension) 11/23/2016    Past Surgical History:  Procedure Laterality Date  . ADENOIDECTOMY    . TEE WITHOUT CARDIOVERSION N/A 11/26/2016   Procedure: TRANSESOPHAGEAL ECHOCARDIOGRAM (TEE);  Surgeon: Thayer Headings, MD;  Location: [place];  Service: Cardiovascular;  Laterality: N/A;  . VIDEO BRONCHOSCOPY Bilateral 12/02/2016   Procedure: VIDEO BRONCHOSCOPY WITHOUT FLUORO;  Surgeon: Raylene Miyamoto, MD;  Location: [place];  Service: Cardiopulmonary;  Laterality: Bilateral;       Home Medications    Prior to Admission  medications   Medication Sig Start Date End Date Taking? Authorizing Provider  lisinopril (ZESTRIL) 10 MG tablet Take 1 tablet (10 mg total) by mouth daily. 03/15/20   Hall-Potvin, [place], PA-C  predniSONE (DELTASONE) 20 MG tablet Take 1 tablet (20 mg total) by mouth daily. 03/15/20   Hall-Potvin, [place], PA-C    Family History Family History  Problem Relation Age of Onset  . Hypertension Mother   . Diabetes Mother     Social History Social History   Tobacco Use  . Smoking status: Former Smoker    Packs/day: 0.50    Years: 20.00    Pack years: 10.00    Types: Cigarettes    Quit date: 11/23/2016    Years since quitting: 3.3  . Smokeless tobacco: Never Used  Substance Use Topics  . Alcohol use: No    Comment: last drink in january 2017  . Drug use: Yes    Types: Marijuana    Comment: last night-daily     Allergies   Patient has no known allergies.   Review of Systems As per HPI   Physical Exam Triage Vital Signs ED Triage Vitals  Enc Vitals Group     BP      Pulse      Resp      Temp      Temp src      SpO2      Weight  Height      Head Circumference      Peak Flow      Pain Score      Pain Loc      Pain Edu?      Excl. in [place]?    No data found.  Updated Vital Signs BP (!) 210/99 Comment: Not taking meds as prescribed  Pulse 94   Temp 97.8 F (36.6 C)   Resp 16   SpO2 98%   Visual Acuity Right Eye Distance:   Left Eye Distance:   Bilateral Distance:    Right Eye Near:   Left Eye Near:    Bilateral Near:     Physical Exam Constitutional:      General: He is not in acute distress. HENT:     Head: Normocephalic and atraumatic.  Eyes:     General: No scleral icterus.    Pupils: Pupils are equal, round, and reactive to light.  Cardiovascular:     Rate and Rhythm: Normal rate.  Pulmonary:     Effort: Pulmonary effort is normal. No respiratory distress.     Breath sounds: No wheezing.  Musculoskeletal:     Comments: Mild edema of  left wrist as compared to right.  No bony tenderness.  Negative snuffbox tenderness.  Negative Tinel's, negative Phalen's, positive Finkelstein's.  Skin:    Coloration: Skin is not jaundiced or pale.  Neurological:     Mental Status: He is alert and oriented to person, place, and time.      UC Treatments / Results  Labs (all labs ordered are listed, but only abnormal results are displayed) Labs Reviewed - No data to display  EKG   Radiology No results found.  Procedures Procedures (including critical care time)  Medications Ordered in UC Medications - No data to display  Initial Impression / Assessment and Plan / UC Course  I have reviewed the triage vital signs and the nursing notes.  Pertinent labs & imaging results that were available during my care of the patient were reviewed by me and considered in my medical decision making (see chart for details).     Patient appears well in office.  He is hypertensive, though denies symptoms of endorgan damage.  Will refill lisinopril, which he is tolerating well in the past.  Patient denies traumatic event and there is no bony tenderness: We will defer radiography at this time.  We will start prednisone to help with inflammation.  Wrist brace placed in office which patient tolerated well.  Reviewed supportive care as outlined below.  Return precautions discussed, patient verbalized understanding and is agreeable to plan. Final Clinical Impressions(s) / UC Diagnoses   Final diagnoses:  De Quervain's tenosynovitis, left     Discharge Instructions     Recommend RICE: rest, ice, compression, elevation as needed for pain.    Heat therapy (hot compress, warm wash rag, hot showers, etc.) can help relax muscles and soothe muscle aches. Cold therapy (ice packs) can be used to help swelling both after injury and after prolonged use of areas of chronic pain/aches.  For pain: Take steroid once daily with breakfast.  May take Tylenol  additionally if needed.    ED Prescriptions    Medication Sig Dispense Auth. Provider   predniSONE (DELTASONE) 20 MG tablet Take 1 tablet (20 mg total) by mouth daily. 5 tablet Hall-Potvin, [place], PA-C   lisinopril (ZESTRIL) 10 MG tablet Take 1 tablet (10 mg total) by mouth daily. 30 tablet Hall-Potvin,  [place], PA-C     PDMP not reviewed this encounter.   Hall-Potvin, [place], PA-C 03/15/20 1703    Hall-Potvin, [place], [place] 03/15/20 1707

## 2020-03-15 NOTE — ED Triage Notes (Signed)
Nontraumatic left wrist/hand pain and swelling x 2 days

## 2020-03-29 ENCOUNTER — Encounter (HOSPITAL_COMMUNITY): Payer: Self-pay

## 2020-03-29 ENCOUNTER — Emergency Department (HOSPITAL_COMMUNITY)
Admission: EM | Admit: 2020-03-29 | Discharge: 2020-03-29 | Disposition: A | Discharge status: Home / Self Care | Payer: Self-pay | Attending: Emergency Medicine | Admitting: Emergency Medicine

## 2020-03-29 DIAGNOSIS — Z79899 Other long term (current) drug therapy: Secondary | ICD-10-CM | POA: Insufficient documentation

## 2020-03-29 DIAGNOSIS — T783XXA Angioneurotic edema, initial encounter: Secondary | ICD-10-CM | POA: Insufficient documentation

## 2020-03-29 DIAGNOSIS — Z87891 Personal history of nicotine dependence: Secondary | ICD-10-CM | POA: Insufficient documentation

## 2020-03-29 DIAGNOSIS — I10 Essential (primary) hypertension: Secondary | ICD-10-CM | POA: Insufficient documentation

## 2020-03-29 MED ORDER — AMLODIPINE BESYLATE 5 MG PO TABS
5.0000 mg | ORAL_TABLET | Freq: Every day | ORAL | 0 refills | Status: DC
Start: 2020-03-29 — End: 2021-02-09

## 2020-03-29 MED ORDER — DEXAMETHASONE SODIUM PHOSPHATE 10 MG/ML IJ SOLN
10.0000 mg | Freq: Once | INTRAMUSCULAR | Status: AC
  Administered 2020-03-29: 10 mg via INTRAMUSCULAR
  Filled 2020-03-29: qty 1

## 2020-03-29 NOTE — ED Provider Notes (Signed)
[facility] DEPT Provider Note   CSN: [id number] Arrival date & time: 03/29/20  1424     History Chief Complaint  Patient presents with  . Allergic Reaction    Jeremy Hood is a 44 y.o. male presenting for evaluation of lip swelling.  Patient states that when he woke up this morning, he had swelling of his upper lip.  He took 4 Benadryl, which improved the swelling, but did not completely resolve it.  He denies symptoms elsewhere.  He denies swelling of his lower lip, tongue, or throat.  He denies difficulty handling secretions.  He states he had pomegranate last night, and thinks this might be the cause.  However that he states he has had pomegranate before without previous reaction.  He takes lisinopril daily, has been on this for several weeks (originalyl rx'd years ago, but he ran out and got a refill a few wks ago).  No previous history of similar swelling.  He denies rash, chest pain, shortness breath, nausea, vomiting abdominal pain.  Reports no medical problems besides hypertension, takes no other medicines daily.  HPI     Past Medical History:  Diagnosis Date  . Chest pain 11/2016  . Hypertension     Patient Active Problem List   Diagnosis Date Noted  . Anemia 12/17/2016  . Obesity (BMI 35.0-39.9 without comorbidity) 12/10/2016  . Acute respiratory failure with hypoxia ([place])   . Loosening of tooth   . Cavitary lung disease   . Dental abscess   . Abnormal chest x-ray with multiple lung nodules   . Liver lesion   . Tobacco abuse   . Cocaine abuse ([place])   . Lung mass 11/26/2016  . Substance abuse, coacaine and marijuana  11/26/2016  . Cough   . Septic embolism ([place])   . Chest pain 11/23/2016  . Costochondritis 11/23/2016  . HTN (hypertension) 11/23/2016    Past Surgical History:  Procedure Laterality Date  . ADENOIDECTOMY    . TEE WITHOUT CARDIOVERSION N/A 11/26/2016   Procedure: TRANSESOPHAGEAL ECHOCARDIOGRAM (TEE);   Surgeon: Thayer Headings, MD;  Location: [facility];  Service: Cardiovascular;  Laterality: N/A;  . VIDEO BRONCHOSCOPY Bilateral 12/02/2016   Procedure: VIDEO BRONCHOSCOPY WITHOUT FLUORO;  Surgeon: Raylene Miyamoto, MD;  Location: [place];  Service: Cardiopulmonary;  Laterality: Bilateral;       Family History  Problem Relation Age of Onset  . Hypertension Mother   . Diabetes Mother     Social History   Tobacco Use  . Smoking status: Former Smoker    Packs/day: 0.50    Years: 20.00    Pack years: 10.00    Types: Cigarettes    Quit date: 11/23/2016    Years since quitting: 3.3  . Smokeless tobacco: Never Used  Substance Use Topics  . Alcohol use: No    Comment: last drink in january 2017  . Drug use: Yes    Types: Marijuana    Comment: last night-daily    Home Medications Prior to Admission medications   Medication Sig Start Date End Date Taking? Authorizing Provider  amLODipine (NORVASC) 5 MG tablet Take 1 tablet (5 mg total) by mouth daily. 03/29/20   Raydon Chappuis, PA-C  lisinopril (ZESTRIL) 10 MG tablet Take 1 tablet (10 mg total) by mouth daily. 03/15/20   Hall-Potvin, [place], PA-C  predniSONE (DELTASONE) 20 MG tablet Take 1 tablet (20 mg total) by mouth daily. 03/15/20   Hall-Potvin, [place], PA-C    Allergies  Patient has no known allergies.  Review of Systems   Review of Systems  HENT:       Upper lip swelling  All other systems reviewed and are negative.   Physical Exam Updated Vital Signs BP (!) 170/127 (BP Location: Right Arm)   Pulse 73   Temp 98.5 F (36.9 C)   Resp 18   SpO2 100%   Physical Exam Vitals and nursing note reviewed.  Constitutional:      General: He is not in acute distress.    Appearance: He is well-developed.     Comments: Sitting in the bed in no acute distress  HENT:     Head: Normocephalic and atraumatic.     Comments: Mild upper lip angioedema.  No swelling of the bottom lip.  No tongue or oropharyngeal  swelling.  Handling secretions easily. Eyes:     Extraocular Movements: Extraocular movements intact.     Conjunctiva/sclera: Conjunctivae normal.     Pupils: Pupils are equal, round, and reactive to light.  Cardiovascular:     Rate and Rhythm: Normal rate and regular rhythm.     Pulses: Normal pulses.  Pulmonary:     Effort: Pulmonary effort is normal. No respiratory distress.     Breath sounds: Normal breath sounds. No wheezing.     Comments: Speaking in full sentences. Clear lung sounds in all fields.  Abdominal:     General: There is no distension.     Palpations: Abdomen is soft. There is no mass.     Tenderness: There is no abdominal tenderness. There is no guarding or rebound.  Musculoskeletal:        General: Normal range of motion.     Cervical back: Normal range of motion and neck supple.  Skin:    General: Skin is warm and dry.     Capillary Refill: Capillary refill takes less than 2 seconds.  Neurological:     Mental Status: He is alert and oriented to person, place, and time.     ED Results / Procedures / Treatments   Labs (all labs ordered are listed, but only abnormal results are displayed) Labs Reviewed - No data to display  EKG None  Radiology No results found.  Procedures Procedures (including critical care time)  Medications Ordered in ED Medications  dexamethasone (DECADRON) injection 10 mg (10 mg Intramuscular Given 03/29/20 1526)    ED Course  I have reviewed the triage vital signs and the nursing notes.  Pertinent labs & imaging results that were available during my care of the patient were reviewed by me and considered in my medical decision making (see chart for details).  Clinical Course as of Mar 29 2041  Thu Mar 29, 2020  1551 44 yo male here with upper lip swelling since this morning.  He is on lisinopril for the past 2 years.  Only his lip was swollen.  IT is better since his arrival.  ON arival mild upper lip swelling, no tongue or  uvula swelling, voice is clear, no stridor, no wheezing. This may be angioedema ace-inhibitor induced.  We'll stop lisinopril, start on amlodipine, have him f/u with PCP.  I think he is stable for discharge.   [MT]    Clinical Course User Index [MT] Trifan, Carola Rhine, MD   MDM Rules/Calculators/A&P                          Pt presenting for evaluation of  upper lip swelling.  On exam, he appears nontoxic.  Airway intact.  Swelling is mild, and improved with Benadryl.  Consider ACE inhibitor induced angioedema edema, favor this over allergy to pomegranates, specially consider delayed sxs.  Will observe to ensure no worsening, and have patient discontinue his lisinopril and start amlodipine.  Patient observed for 2-1/2 hours in the ED without worsening swelling.  Discussed switching antihypertensives and following up with his PCP.  At this time, patient appears safe for discharge.  Return precautions given.  Patient states he understands and agrees to plan.  Final Clinical Impression(s) / ED Diagnoses Final diagnoses:  Angioedema, initial encounter    Rx / DC Orders ED Discharge Orders         Ordered    amLODipine (NORVASC) 5 MG tablet  Daily     Discontinue  Reprint     03/29/20 1621           Franchot Heidelberg, PA-C 03/29/20 2059    Wyvonnia Dusky, MD 03/30/20 1149

## 2020-03-29 NOTE — ED Triage Notes (Signed)
Patient reports eating pomegranate yesterday and woke up with swollen lips this morning.   Reports high BP and took BP medication this morning.   Denies shob    A/Ox4  Ambulatory in triage  Patient able to speak in complete sentences.

## 2020-03-29 NOTE — Discharge Instructions (Signed)
Stop taking lisinopril, and start taking amlodipine for blood pressure medicine. Follow-up with your primary care doctor for recheck of your blood pressure and further blood pressure management this and management. Return to the emergency room if you develop worsening lip swelling. Return if you have tongue swelling, difficulty breathing, or difficulty swallowing. Return with any new, worsening, or concerning symptoms.

## 2020-09-05 ENCOUNTER — Encounter: Payer: Self-pay | Admitting: Emergency Medicine

## 2020-09-05 ENCOUNTER — Other Ambulatory Visit: Payer: Self-pay

## 2020-09-05 ENCOUNTER — Ambulatory Visit
Admission: EM | Admit: 2020-09-05 | Discharge: 2020-09-05 | Disposition: A | Discharge status: Home / Self Care | Payer: Self-pay | Attending: Emergency Medicine | Admitting: Emergency Medicine

## 2020-09-05 DIAGNOSIS — T23202A Burn of second degree of left hand, unspecified site, initial encounter: Secondary | ICD-10-CM

## 2020-09-05 DIAGNOSIS — Z23 Encounter for immunization: Secondary | ICD-10-CM

## 2020-09-05 DIAGNOSIS — I160 Hypertensive urgency: Secondary | ICD-10-CM

## 2020-09-05 MED ORDER — CEPHALEXIN 500 MG PO CAPS
500.0000 mg | ORAL_CAPSULE | Freq: Two times a day (BID) | ORAL | 0 refills | Status: AC
Start: 2020-09-05 — End: 2020-09-10

## 2020-09-05 MED ORDER — TETANUS-DIPHTH-ACELL PERTUSSIS 5-2.5-18.5 LF-MCG/0.5 IM SUSY
0.5000 mL | PREFILLED_SYRINGE | Freq: Once | INTRAMUSCULAR | Status: AC
  Administered 2020-09-05: 0.5 mL via INTRAMUSCULAR

## 2020-09-05 MED ORDER — SILVER SULFADIAZINE 1 % EX CREA: TOPICAL_CREAM | Freq: Once | CUTANEOUS | Status: AC

## 2020-09-05 NOTE — Discharge Instructions (Addendum)
Keep area(s) clean and dry. Take antibiotic as prescribed with food - important to complete course. Return for worsening pain, redness, swelling, discharge, fever. 

## 2020-09-05 NOTE — ED Triage Notes (Signed)
Patient c/o burn on LFT hand that occurred on Sunday.   Patient stated " I was at work when I was trying to heat up some food and pouring hot water into a pan and steam shot up straight onto my hand. My hand is black and starting to swell".  Patient endorses "tingling in finger tips".   Patient stated pain has worsened since incident.

## 2020-09-05 NOTE — ED Provider Notes (Signed)
[name]    CSN: [id number] Arrival date & time: 09/05/20  1208      History   Chief Complaint Chief Complaint  Patient presents with  . Hand Burn    HPI Jeremy Hood is a 44 y.o. male  Presenting for burn to left hand, dorsal aspect.  States this occurred Sunday, using hot water.  Endorsing pain without blistering, discharge, malaise.  Past Medical History:  Diagnosis Date  . Chest pain 11/2016  . Hypertension     Patient Active Problem List   Diagnosis Date Noted  . Anemia 12/17/2016  . Obesity (BMI 35.0-39.9 without comorbidity) 12/10/2016  . Acute respiratory failure with hypoxia ([place])   . Loosening of tooth   . Cavitary lung disease   . Dental abscess   . Abnormal chest x-ray with multiple lung nodules   . Liver lesion   . Tobacco abuse   . Cocaine abuse ([place])   . Lung mass 11/26/2016  . Substance abuse, coacaine and marijuana  11/26/2016  . Cough   . Septic embolism ([place])   . Chest pain 11/23/2016  . Costochondritis 11/23/2016  . HTN (hypertension) 11/23/2016    Past Surgical History:  Procedure Laterality Date  . ADENOIDECTOMY    . TEE WITHOUT CARDIOVERSION N/A 11/26/2016   Procedure: TRANSESOPHAGEAL ECHOCARDIOGRAM (TEE);  Surgeon: Thayer Headings, MD;  Location: [facility];  Service: Cardiovascular;  Laterality: N/A;  . VIDEO BRONCHOSCOPY Bilateral 12/02/2016   Procedure: VIDEO BRONCHOSCOPY WITHOUT FLUORO;  Surgeon: Raylene Miyamoto, MD;  Location: [facility];  Service: Cardiopulmonary;  Laterality: Bilateral;       Home Medications    Prior to Admission medications   Medication Sig Start Date End Date Taking? Authorizing Provider  amLODipine (NORVASC) 5 MG tablet Take 1 tablet (5 mg total) by mouth daily. 03/29/20   Caccavale, Sophia, PA-C  cephALEXin (KEFLEX) 500 MG capsule Take 1 capsule (500 mg total) by mouth 2 (two) times daily for 5 days. 09/05/20 09/10/20  Hall-Potvin, [place], PA-C  lisinopril (ZESTRIL) 10 MG  tablet Take 1 tablet (10 mg total) by mouth daily. 03/15/20   Hall-Potvin, [place], PA-C    Family History Family History  Problem Relation Age of Onset  . Hypertension Mother   . Diabetes Mother     Social History Social History   Tobacco Use  . Smoking status: Former Smoker    Packs/day: 0.50    Years: 20.00    Pack years: 10.00    Types: Cigarettes    Quit date: 11/23/2016    Years since quitting: 3.7  . Smokeless tobacco: Never Used  Substance Use Topics  . Alcohol use: No    Comment: last drink in january 2017  . Drug use: Yes    Types: Marijuana    Comment: last night-daily     Allergies   Patient has no known allergies.   Review of Systems Review of Systems  Constitutional: Negative for fatigue and fever.  Respiratory: Negative for cough and shortness of breath.   Cardiovascular: Negative for chest pain and palpitations.  Gastrointestinal: Negative for abdominal pain, diarrhea and vomiting.  Musculoskeletal: Negative for arthralgias and myalgias.  Skin: Positive for wound. Negative for rash.  Neurological: Negative for speech difficulty and headaches.  All other systems reviewed and are negative.    Physical Exam Triage Vital Signs ED Triage Vitals  Enc Vitals Group     BP      Pulse  Resp      Temp      Temp src      SpO2      Weight      Height      Head Circumference      Peak Flow      Pain Score      Pain Loc      Pain Edu?      Excl. in [place]?    No data found.  Updated Vital Signs BP (!) 189/115 (BP Location: Left Arm)   Pulse 74   Temp 98.4 F (36.9 C) (Oral)   Resp 18   SpO2 98%   Visual Acuity Right Eye Distance:   Left Eye Distance:   Bilateral Distance:    Right Eye Near:   Left Eye Near:    Bilateral Near:     Physical Exam Constitutional:      General: He is not in acute distress. HENT:     Head: Normocephalic and atraumatic.  Eyes:     General: No scleral icterus.    Pupils: Pupils are equal, round, and  reactive to light.  Cardiovascular:     Rate and Rhythm: Normal rate.  Pulmonary:     Effort: Pulmonary effort is normal. No respiratory distress.     Breath sounds: No wheezing.  Musculoskeletal:        General: Tenderness present. No swelling.  Skin:    General: Skin is warm.     Capillary Refill: Capillary refill takes less than 2 seconds.     Coloration: Skin is not jaundiced or pale.     Comments: 31 of dorsal aspect of left hand with superficial burn.  No blisters, discharge.  Warm, blanches.  1 cm area distally of partial-thickness burn.  No discharge.  Neurovascular intact.  Neurological:     General: No focal deficit present.     Mental Status: He is alert and oriented to person, place, and time.      UC Treatments / Results  Labs (all labs ordered are listed, but only abnormal results are displayed) Labs Reviewed - No data to display  EKG   Radiology No results found.  Procedures Procedures (including critical care time)  Medications Ordered in UC Medications  Tdap (BOOSTRIX) injection 0.5 mL (0.5 mLs Intramuscular Given 09/05/20 1227)  silver sulfADIAZINE (SILVADENE) 1 % cream ( Topical Given 09/05/20 1229)    Initial Impression / Assessment and Plan / UC Course  I have reviewed the triage vital signs and the nursing notes.  Pertinent labs & imaging results that were available during my care of the patient were reviewed by me and considered in my medical decision making (see chart for details).     Wound appears well, though given extent will provide tetanus booster, Silvadene, broad-spectrum antibiotic.  Will push fluids, follow-up with PCP for further evaluation management as needed.  Patient's blood pressure is elevated: Denying chest pain, difficulty breathing, headache.  Return precautions discussed, pt verbalized understanding and is agreeable to plan. Final Clinical Impressions(s) / UC Diagnoses   Final diagnoses:  Hypertensive urgency  Partial  thickness burn of left hand, unspecified site of hand, initial encounter     Discharge Instructions     Keep area(s) clean and dry. Take antibiotic as prescribed with food - important to complete course. Return for worsening pain, redness, swelling, discharge, fever.    ED Prescriptions    Medication Sig Dispense Auth. Provider   cephALEXin (KEFLEX) 500 MG  capsule Take 1 capsule (500 mg total) by mouth 2 (two) times daily for 5 days. 10 capsule Hall-Potvin, [place], PA-C     PDMP not reviewed this encounter.   Hall-Potvin, [place], [place] 09/05/20 1301

## 2021-02-09 ENCOUNTER — Other Ambulatory Visit: Payer: Self-pay

## 2021-02-09 ENCOUNTER — Ambulatory Visit
Admission: EM | Admit: 2021-02-09 | Discharge: 2021-02-09 | Disposition: A | Discharge status: Home / Self Care | Payer: Self-pay | Attending: Family Medicine | Admitting: Family Medicine

## 2021-02-09 ENCOUNTER — Encounter: Payer: Self-pay | Admitting: Emergency Medicine

## 2021-02-09 DIAGNOSIS — M25562 Pain in left knee: Secondary | ICD-10-CM

## 2021-02-09 DIAGNOSIS — I10 Essential (primary) hypertension: Secondary | ICD-10-CM

## 2021-02-09 MED ORDER — DEXAMETHASONE SODIUM PHOSPHATE 10 MG/ML IJ SOLN
10.0000 mg | Freq: Once | INTRAMUSCULAR | Status: AC
  Administered 2021-02-09: 10 mg via INTRAMUSCULAR

## 2021-02-09 MED ORDER — DICLOFENAC SODIUM 75 MG PO TBEC
75.0000 mg | DELAYED_RELEASE_TABLET | Freq: Two times a day (BID) | ORAL | 0 refills | Status: DC
Start: 2021-02-09 — End: 2023-03-25

## 2021-02-09 MED ORDER — AMLODIPINE BESYLATE 10 MG PO TABS
10.0000 mg | ORAL_TABLET | Freq: Every day | ORAL | 2 refills | Status: DC
Start: 2021-02-09 — End: 2022-05-03

## 2021-02-09 NOTE — Discharge Instructions (Signed)
Your blood pressure was noted to be elevated during your visit today. Begin taking the medication prescribed. You may return here within the next few days to recheck if unable to see your primary care provider or if you do not have a one.  BP (!) 194/135 (BP Location: Left Arm)   Pulse 84   Temp 99.7 F (37.6 C) (Oral)   Resp 20   SpO2 97%   BP Readings from Last 3 Encounters:  02/09/21 (!) 194/135  09/05/20 (!) 189/115  03/29/20 (!) 170/127

## 2021-02-09 NOTE — ED Triage Notes (Addendum)
Patient c/o LFT knee pain x 5 days.   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   Patient endorses increased pain with ambulation and bending.   Patient endorses history of knee problems that required draining.   Patient endorses warmness to affected area. Patient endorses some swelling.   Patient endorses using lidocaine patches and Tylenol with no relief of symptoms.

## 2021-02-09 NOTE — ED Provider Notes (Signed)
[facility]   [id number] 02/09/21 Arrival Time: 8295  ASSESSMENT & PLAN:  1. Acute pain of left knee   2. Uncontrolled hypertension     No indication for plain imaging of knee at this time. No s/s of septic joint. No s/s of hypertensive urgency.  Meds ordered this encounter  Medications  . diclofenac (VOLTAREN) 75 MG EC tablet    Sig: Take 1 tablet (75 mg total) by mouth 2 (two) times daily.    Dispense:  14 tablet    Refill:  0  . amLODipine (NORVASC) 10 MG tablet    Sig: Take 1 tablet (10 mg total) by mouth daily.    Dispense:  30 tablet    Refill:  2  . dexamethasone (DECADRON) injection 10 mg    Orders Placed This Encounter  Procedures  . Apply knee brace/sleeve    Recommend:  Follow-up Information    Schedule an appointment as soon as possible for a visit  with Guadalupe Guerra.   Why: To follow your blood pressure. Contact information: [address] [place] [address] [phone]       [facility], [facility].   Specialty: Specialist Why: If your knee pain is worsening or failing to improve as anticipated. Contact information: [address] [place] [address] [phone]                Discharge Instructions      Your blood pressure was noted to be elevated during your visit today. Begin taking the medication prescribed. You may return here within the next few days to recheck if unable to see your primary care provider or if you do not have a one.  BP (!) 194/135 (BP Location: Left Arm)   Pulse 84   Temp 99.7 F (37.6 C) (Oral)   Resp 20   SpO2 97%   BP Readings from Last 3 Encounters:  02/09/21 (!) 194/135  09/05/20 (!) 189/115  03/29/20 (!) 170/127          Reviewed expectations re: course of current medical issues. Questions answered. Outlined signs and symptoms indicating need for more acute intervention. Patient verbalized understanding. After Visit  Summary given.  SUBJECTIVE: History from: patient. Jeremy Hood is a 45 y.o. male who reports fairly persistent marked pain of his left knee; described as aching and throbbing; without radiation. Onset: gradual. First noted: 4-5 d ago. 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 Symptoms have gradually worsened since beginning. Aggravating factors: certain movements and weight bearing. Alleviating factors: have not been identified. Associated symptoms: none reported. Extremity sensation changes or weakness: none. Self treatment: acetaminophen, with no relief.  History of similar: h/o knee swelling in far distant past "that they had to drain".  Past Surgical History:  Procedure Laterality Date  . ADENOIDECTOMY    . TEE WITHOUT CARDIOVERSION N/A 11/26/2016   Procedure: TRANSESOPHAGEAL ECHOCARDIOGRAM (TEE);  Surgeon: Thayer Headings, MD;  Location: [facility];  Service: Cardiovascular;  Laterality: N/A;  . VIDEO BRONCHOSCOPY Bilateral 12/02/2016   Procedure: VIDEO BRONCHOSCOPY WITHOUT FLUORO;  Surgeon: Raylene Miyamoto, MD;  Location: [facility];  Service: Cardiopulmonary;  Laterality: Bilateral;    Increased blood pressure noted today. Reports that he has been treated for hypertension in the past. Out of medication; no PCP; is looking. He reports no chest pain on exertion, no dyspnea on exertion, no orthostatic dizziness or lightheadedness, no orthopnea or paroxysmal nocturnal  dyspnea, no palpitations and no intermittent claudication symptoms.   OBJECTIVE:  Vitals:   02/09/21 1007 02/09/21 1010  BP: (!) 200/140 (!) 194/135  Pulse: 85 84  Resp: 13 20  Temp: 99.7 F (37.6 C) 99.7 F (37.6 C)  TempSrc: Oral Oral  SpO2: 97% 97%    General appearance: alert; no distress HEENT: [place]; AT Neck: supple with FROM Resp: unlabored respirations Extremities: . LLE: warm with well perfused appearance; poorly localized moderate  tenderness over left medial and lateral knee; overall difficult exam secondary to reported discomfort; without gross deformities; swelling: minimal; bruising: none; no overlying erythema; knee ROM: normal, with discomfort Skin: warm and dry; no visible rashes Neurologic: gait favors LLE; normal sensation and strength of LLE Psychological: alert and cooperative; normal mood and affect   Allergies  Allergen Reactions  . Lisinopril Other (See Comments)    Angioedema     Past Medical History:  Diagnosis Date  . Chest pain 11/2016  . Hypertension    Social History   Socioeconomic History  . Marital status: Married    Spouse name: Not on file  . Number of children: Not on file  . Years of education: Not on file  . Highest education level: Not on file  Occupational History  . Not on file  Tobacco Use  . Smoking status: Former Smoker    Packs/day: 0.50    Years: 20.00    Pack years: 10.00    Types: Cigarettes    Quit date: 11/23/2016    Years since quitting: 4.2  . Smokeless tobacco: Never Used  Substance and Sexual Activity  . Alcohol use: No    Comment: last drink in january 2017  . Drug use: Yes    Types: Marijuana    Comment: last night-daily  . Sexual activity: Not on file  Other Topics Concern  . Not on file  Social History Narrative  . Not on file   Social Determinants of Health   Financial Resource Strain: Not on file  Food Insecurity: Not on file  Transportation Needs: Not on file  Physical Activity: Not on file  Stress: Not on file  Social Connections: Not on file   Family History  Problem Relation Age of Onset  . Hypertension Mother   . Diabetes Mother    Past Surgical History:  Procedure Laterality Date  . ADENOIDECTOMY    . TEE WITHOUT CARDIOVERSION N/A 11/26/2016   Procedure: TRANSESOPHAGEAL ECHOCARDIOGRAM (TEE);  Surgeon: Thayer Headings, MD;  Location: [place] [facility];  Service: Cardiovascular;  Laterality: N/A;  . VIDEO BRONCHOSCOPY Bilateral  12/02/2016   Procedure: VIDEO BRONCHOSCOPY WITHOUT FLUORO;  Surgeon: Raylene Miyamoto, MD;  Location: [facility];  Service: Cardiopulmonary;  Laterality: Bilateral;      Vanessa Kick, MD 02/09/21 1044

## 2022-05-03 ENCOUNTER — Other Ambulatory Visit: Payer: Self-pay

## 2022-05-03 ENCOUNTER — Encounter (HOSPITAL_COMMUNITY): Payer: Self-pay | Admitting: Emergency Medicine

## 2022-05-03 ENCOUNTER — Ambulatory Visit (HOSPITAL_COMMUNITY)
Admission: EM | Admit: 2022-05-03 | Discharge: 2022-05-03 | Disposition: A | Discharge status: Home / Self Care | Payer: Self-pay | Attending: Internal Medicine | Admitting: Internal Medicine

## 2022-05-03 DIAGNOSIS — M109 Gout, unspecified: Secondary | ICD-10-CM

## 2022-05-03 DIAGNOSIS — I10 Essential (primary) hypertension: Secondary | ICD-10-CM

## 2022-05-03 MED ORDER — AMLODIPINE BESYLATE 10 MG PO TABS: 10.0000 mg | ORAL_TABLET | Freq: Every day | ORAL | 0 refills | Status: DC

## 2022-05-03 MED ORDER — PREDNISONE 20 MG PO TABS: 40.0000 mg | ORAL_TABLET | Freq: Every day | ORAL | 0 refills | Status: AC

## 2022-05-03 NOTE — ED Provider Notes (Signed)
[place]    CSN: [id number] Arrival date & time: 05/03/22  1007      History   Chief Complaint Chief Complaint  Patient presents with   Gout    HPI Jeremy Hood is a 46 y.o. male.   Patient presents for right great toe pain that started about 5 days ago.  Patient reports history of gout and states that this "feels similar".  He has been eating larger amounts of red meat lately.  He has not taken any medications for pain.  Denies any apparent injury.  Denies fever, body aches, chills.  Patient is also requesting refill on amlodipine.  He was seen in April and prescribed amlodipine for hypertension.  He has not seen PCP in multiple years and has not been able to get in with PCP since urgent care visit.  He states that he ran out of the medication after 30 days.  He does not check his blood pressure at home.  Denies chest pain, shortness of breath, blurred vision, dizziness, nausea, vomiting.     Past Medical History:  Diagnosis Date   Chest pain 11/2016   Hypertension     Patient Active Problem List   Diagnosis Date Noted   Anemia 12/17/2016   Obesity (BMI 35.0-39.9 without comorbidity) 12/10/2016   Acute respiratory failure with hypoxia (HCC)    Loosening of tooth    Cavitary lung disease    Dental abscess    Abnormal chest x-ray with multiple lung nodules    Liver lesion    Tobacco abuse    Cocaine abuse ([place])    Lung mass 11/26/2016   Substance abuse, coacaine and marijuana  11/26/2016   Cough    Septic embolism ([place])    Chest pain 11/23/2016   Costochondritis 11/23/2016   HTN (hypertension) 11/23/2016    Past Surgical History:  Procedure Laterality Date   ADENOIDECTOMY     TEE WITHOUT CARDIOVERSION N/A 11/26/2016   Procedure: TRANSESOPHAGEAL ECHOCARDIOGRAM (TEE);  Surgeon: Thayer Headings, MD;  Location: [facility];  Service: Cardiovascular;  Laterality: N/A;   VIDEO BRONCHOSCOPY Bilateral 12/02/2016   Procedure: VIDEO BRONCHOSCOPY  WITHOUT FLUORO;  Surgeon: Raylene Miyamoto, MD;  Location: [facility];  Service: Cardiopulmonary;  Laterality: Bilateral;       Home Medications    Prior to Admission medications   Medication Sig Start Date End Date Taking? Authorizing Provider  predniSONE (DELTASONE) 20 MG tablet Take 2 tablets (40 mg total) by mouth daily for 5 days. 05/03/22 05/08/22 Yes Keauna Brasel, Michele Rockers, FNP  amLODipine (NORVASC) 10 MG tablet Take 1 tablet (10 mg total) by mouth daily. 05/03/22   Teodora Medici, FNP  diclofenac (VOLTAREN) 75 MG EC tablet Take 1 tablet (75 mg total) by mouth 2 (two) times daily. 02/09/21   Vanessa Kick, MD  lisinopril (ZESTRIL) 10 MG tablet Take 1 tablet (10 mg total) by mouth daily. 03/15/20 02/09/21  Hall-Potvin, [place], PA-C    Family History Family History  Problem Relation Age of Onset   Hypertension Mother    Diabetes Mother     Social History Social History   Tobacco Use   Smoking status: Every Day    Packs/day: 0.50    Years: 20.00    Total pack years: 10.00    Types: Cigarettes    Last attempt to quit: 11/23/2016    Years since quitting: 5.4   Smokeless tobacco: Never  Vaping Use   Vaping Use: Never used  Substance  Use Topics   Alcohol use: No    Comment: last drink in january 2017   Drug use: Yes    Types: Marijuana    Comment: last night-daily     Allergies   Lisinopril   Review of Systems Review of Systems Per HPI  Physical Exam Triage Vital Signs ED Triage Vitals  Enc Vitals Group     BP 05/03/22 1034 (!) 181/122     Pulse Rate 05/03/22 1034 87     Resp 05/03/22 1034 20     Temp 05/03/22 1034 98.2 F (36.8 C)     Temp Source 05/03/22 1034 Oral     SpO2 05/03/22 1034 97 %     Weight --      Height --      Head Circumference --      Peak Flow --      Pain Score 05/03/22 1030 10     Pain Loc --      Pain Edu? --      Excl. in [place]? --    No data found.  Updated Vital Signs BP (!) 164/104 (BP Location: Right Arm) Comment:  respositioned arm  Pulse 87   Temp 98.2 F (36.8 C) (Oral)   Resp 20   SpO2 97%   Visual Acuity Right Eye Distance:   Left Eye Distance:   Bilateral Distance:    Right Eye Near:   Left Eye Near:    Bilateral Near:     Physical Exam Constitutional:      General: He is not in acute distress.    Appearance: Normal appearance. He is not toxic-appearing or diaphoretic.  HENT:     Head: Normocephalic and atraumatic.  Eyes:     Extraocular Movements: Extraocular movements intact.     Conjunctiva/sclera: Conjunctivae normal.     Pupils: Pupils are equal, round, and reactive to light.  Cardiovascular:     Rate and Rhythm: Normal rate and regular rhythm.     Pulses: Normal pulses.     Heart sounds: Normal heart sounds.  Pulmonary:     Effort: Pulmonary effort is normal. No respiratory distress.     Breath sounds: Normal breath sounds.  Musculoskeletal:     Comments: Mild swelling, erythema, tenderness to palpation to right MTP joint that extends slightly into dorsal surface of foot.  Patient has full range of motion of toe.  Capillary refill and pulses intact.  Neurological:     General: No focal deficit present.     Mental Status: He is alert and oriented to person, place, and time. Mental status is at baseline.     Cranial Nerves: Cranial nerves 2-12 are intact.     Sensory: Sensation is intact.     Motor: Motor function is intact.     Coordination: Coordination is intact.     Gait: Gait is intact.  Psychiatric:        Mood and Affect: Mood normal.        Behavior: Behavior normal.        Thought Content: Thought content normal.        Judgment: Judgment normal.      UC Treatments / Results  Labs (all labs ordered are listed, but only abnormal results are displayed) Labs Reviewed - No data to display  EKG   Radiology No results found.  Procedures Procedures (including critical care time)  Medications Ordered in UC Medications - No data to display  Initial  Impression /  Assessment and Plan / UC Course  I have reviewed the triage vital signs and the nursing notes.  Pertinent labs & imaging results that were available during my care of the patient were reviewed by me and considered in my medical decision making (see chart for details).     Physical exam is consistent with gout.  Will treat with prednisone steroid as patient states that he has taken this previously and has tolerated well with resolution of gout.  Advised avoiding high purine foods and supportive care.  Patient has significantly elevated blood pressure with recheck being slightly better.  No signs of hypertensive urgency, neuro exam is normal, and no signs of endorgan damage.  Will refill amlodipine and patient advised to monitor blood pressure very closely.  Advised to follow-up with PCP as soon as possible for further evaluation and management of hypertension.  Patient was given strict return and ER precautions.  Patient verbalized understanding and was agreeable with plan. Final Clinical Impressions(s) / UC Diagnoses   Final diagnoses:  Acute gout involving toe of right foot, unspecified cause  Essential hypertension     Discharge Instructions      It appears that you have gout so you are being treated with prednisone.  Please avoid eating high purine foods and education has been attached to your discharge papers.  Your medication for blood pressure has been refilled as well.  Please get a home blood pressure cuff and monitor at home.  Please follow-up with primary care doctor as soon as possible for further evaluation and management and further refills of your blood pressure medication.  Please go to the hospital if you develop chest pain, shortness of breath, headache, dizziness, blurred vision, nausea, vomiting.    ED Prescriptions     Medication Sig Dispense Auth. Provider   amLODipine (NORVASC) 10 MG tablet Take 1 tablet (10 mg total) by mouth daily. 30 tablet [place],  [place], [place]   predniSONE (DELTASONE) 20 MG tablet Take 2 tablets (40 mg total) by mouth daily for 5 days. 10 tablet Teodora Medici, [place]      PDMP not reviewed this encounter.   Teodora Medici, [place] 05/03/22 1121

## 2022-05-03 NOTE — Discharge Instructions (Signed)
It appears that you have gout so you are being treated with prednisone.  Please avoid eating high purine foods and education has been attached to your discharge papers.  Your medication for blood pressure has been refilled as well.  Please get a home blood pressure cuff and monitor at home.  Please follow-up with primary care doctor as soon as possible for further evaluation and management and further refills of your blood pressure medication.  Please go to the hospital if you develop chest pain, shortness of breath, headache, dizziness, blurred vision, nausea, vomiting.

## 2022-05-03 NOTE — ED Triage Notes (Addendum)
Patient has a history of gout and thinks this is what is going on today.  This episode started Tuesday.  No known injury.  Right great to swollen, red, painful.  Reports throbbing, burning pain.  Has iced toe, took tylenol pm.     Out of blood pressure medicine, asking for refill, no pcp

## 2023-01-23 ENCOUNTER — Emergency Department (HOSPITAL_COMMUNITY)
Admission: EM | Admit: 2023-01-23 | Discharge: 2023-01-24 | Disposition: A | Discharge status: Home / Self Care | Payer: Self-pay | Attending: Emergency Medicine | Admitting: Emergency Medicine

## 2023-01-23 ENCOUNTER — Other Ambulatory Visit: Payer: Self-pay

## 2023-01-23 DIAGNOSIS — I10 Essential (primary) hypertension: Secondary | ICD-10-CM | POA: Insufficient documentation

## 2023-01-23 DIAGNOSIS — K644 Residual hemorrhoidal skin tags: Secondary | ICD-10-CM | POA: Insufficient documentation

## 2023-01-23 DIAGNOSIS — K649 Unspecified hemorrhoids: Secondary | ICD-10-CM

## 2023-01-23 DIAGNOSIS — Z79899 Other long term (current) drug therapy: Secondary | ICD-10-CM | POA: Insufficient documentation

## 2023-01-23 LAB — CBC WITH DIFFERENTIAL/PLATELET
Abs Immature Granulocytes: 0.03 10*3/uL (ref 0.00–0.07)
Basophils Absolute: 0 10*3/uL (ref 0.0–0.1)
Basophils Relative: 0 %
Eosinophils Absolute: 0 10*3/uL (ref 0.0–0.5)
Eosinophils Relative: 0 %
HCT: 36 % — ABNORMAL LOW (ref 39.0–52.0)
Hemoglobin: 11.6 g/dL — ABNORMAL LOW (ref 13.0–17.0)
Immature Granulocytes: 0 %
Lymphocytes Relative: 16 %
Lymphs Abs: 1.6 10*3/uL (ref 0.7–4.0)
MCH: 28.3 pg (ref 26.0–34.0)
MCHC: 32.2 g/dL (ref 30.0–36.0)
MCV: 87.8 fL (ref 80.0–100.0)
Monocytes Absolute: 0.5 10*3/uL (ref 0.1–1.0)
Monocytes Relative: 5 %
Neutro Abs: 7.5 10*3/uL (ref 1.7–7.7)
Neutrophils Relative %: 79 %
Platelets: 296 10*3/uL (ref 150–400)
RBC: 4.1 MIL/uL — ABNORMAL LOW (ref 4.22–5.81)
RDW: 14.8 % (ref 11.5–15.5)
WBC: 9.6 10*3/uL (ref 4.0–10.5)
nRBC: 0 % (ref 0.0–0.2)

## 2023-01-23 LAB — COMPREHENSIVE METABOLIC PANEL
ALT: 22 U/L (ref 0–44)
AST: 24 U/L (ref 15–41)
Albumin: 3.5 g/dL (ref 3.5–5.0)
Alkaline Phosphatase: 76 U/L (ref 38–126)
Anion gap: 10 (ref 5–15)
BUN: 11 mg/dL (ref 6–20)
CO2: 24 mmol/L (ref 22–32)
Calcium: 8.9 mg/dL (ref 8.9–10.3)
Chloride: 103 mmol/L (ref 98–111)
Creatinine, Ser: 1.27 mg/dL — ABNORMAL HIGH (ref 0.61–1.24)
GFR, Estimated: >60 mL/min (ref >60)
Glucose, Bld: 157 mg/dL — ABNORMAL HIGH (ref 70–99)
Potassium: 3 mmol/L — ABNORMAL LOW (ref 3.5–5.1)
Sodium: 137 mmol/L (ref 135–145)
Total Bilirubin: 1.7 mg/dL — ABNORMAL HIGH (ref 0.3–1.2)
Total Protein: 7.3 g/dL (ref 6.5–8.1)

## 2023-01-23 NOTE — ED Triage Notes (Signed)
Pt reports bright read bleeding from his rectum and increased rectal pain that started today. Denies abdominal pain or hx of hemorrhoids. Pt is hypertensive in triage, states he has not had his bp medications today.

## 2023-01-23 NOTE — ED Provider Triage Note (Addendum)
Emergency Medicine Provider Triage Evaluation Note  Jeremy Hood , a 47 y.o. male  was evaluated in triage.  Pt complains of rectal bleeding for 1 day.  Patient stated he had a bowel movement last week in which there was blood but has not had any blood since until today but has endorsed ongoing pain when he sits.  Patient states he is taking Tylenol or ibuprofen however has not helped.  Patient denies blood thinners, history of blood clots, fatigue, passing out, chest pain, shortness of breath.  Patient states the rectal bleeding is bright red blood per rectum and that it hurts to wipe.  Patient denies history of hemorrhoids.  Review of Systems  Positive: See HPI Negative: See HPI  Physical Exam  BP (!) 203/113 (BP Location: Right Arm)   Pulse 96   Temp 98.2 F (36.8 C)   Resp 20   SpO2 94%  Gen:   Awake, no distress   Resp:  Normal effort  MSK:   Moves extremities without difficulty  Other:  Nontender, no peritoneal signs  Medical Decision Making  Medically screening exam initiated at 9:42 PM.  Appropriate orders placed.  Vilma Meckel was informed that the remainder of the evaluation will be completed by another provider, this initial triage assessment does not replace that evaluation, and the importance of remaining in the ED until their evaluation is complete.  Workup initiated, patient was given Tylenol for symptom management, patient stable at this time    Remi Deter 01/23/23 2147

## 2023-01-24 MED ORDER — LIDOCAINE HCL URETHRAL/MUCOSAL 2 % EX GEL
1.0000 | Freq: Once | CUTANEOUS | Status: AC
  Administered 2023-01-24: 1 via TOPICAL
  Filled 2023-01-24: qty 11

## 2023-01-24 MED ORDER — HYDROCORTISONE (PERIANAL) 2.5 % EX CREA: 1.0000 | TOPICAL_CREAM | Freq: Two times a day (BID) | CUTANEOUS | 0 refills | Status: DC

## 2023-01-24 NOTE — ED Provider Notes (Signed)
[place] [facility] Provider [facility]   CSN: [id number] Arrival date & time: 01/23/23  2133     History  Chief Complaint  Patient presents with   Rectal Bleeding    Jeremy Hood Behne is a 47 y.o. male.  The history is provided by the patient.  Rectal Bleeding Jeremy Hood [place] is a 47 y.o. male who presents to the Emergency Department complaining of rectal bleeding.  He presents to the emergency department after having rectal bleeding today.  He states that 1 week ago he had a very large bowel movement and has had some rectal soreness since that time.  Today he had another large bowel movement and had significant bright red blood per rectum as well as increased rectal pain.  No fevers, nausea, vomiting, abdominal pain.  He has a history of hypertension but was unable to take his medication today.  He does not take any blood thinners.  No prior similar symptoms.      Home Medications Prior to Admission medications   Medication Sig Start Date End Date Taking? Authorizing Provider  hydrocortisone (ANUSOL-HC) 2.5 % rectal cream Place 1 Application rectally 2 (two) times daily. 01/24/23  Yes Tilden Fossaees, Shanitra Phillippi, MD  amLODipine (NORVASC) 10 MG tablet Take 1 tablet (10 mg total) by mouth daily. Patient not taking: Reported on 01/23/2023 05/03/22   Gustavus BryantMound, Haley E, FNP  diclofenac (VOLTAREN) 75 MG EC tablet Take 1 tablet (75 mg total) by mouth 2 (two) times daily. Patient not taking: Reported on 01/23/2023 02/09/21   Mardella LaymanHagler, Brian, MD  lisinopril (ZESTRIL) 10 MG tablet Take 1 tablet (10 mg total) by mouth daily. 03/15/20 02/09/21  Hall-Potvin, [place], PA-C      Allergies    Lisinopril    Review of Systems   Review of Systems  Gastrointestinal:  Positive for hematochezia.  All other systems reviewed and are negative.   Physical Exam Updated Vital Signs BP (!) 170/123 (BP Location: Right Arm)   Pulse 88   Temp 98.9 F (37.2 C) (Oral)   Resp 18   SpO2 100%   Physical Exam Vitals and nursing note reviewed.  Constitutional:      Appearance: He is well-developed.  HENT:     Head: Normocephalic and atraumatic.  Cardiovascular:     Rate and Rhythm: Normal rate and regular rhythm.  Pulmonary:     Effort: Pulmonary effort is normal. No respiratory distress.  Abdominal:     Palpations: Abdomen is soft.     Tenderness: There is no abdominal tenderness. There is no guarding or rebound.  Genitourinary:    Comments: Two external hemorrhoids with local tenderness to palpation with small amount of bright red blood.  Musculoskeletal:        General: No tenderness.  Skin:    General: Skin is warm and dry.  Neurological:     Mental Status: He is alert and oriented to person, place, and time.  Psychiatric:        Behavior: Behavior normal.     ED Results / Procedures / Treatments   Labs (all labs ordered are listed, but only abnormal results are displayed) Labs Reviewed  COMPREHENSIVE METABOLIC PANEL - Abnormal; Notable for the following components:      Result Value   Potassium 3.0 (*)    Glucose, Bld 157 (*)    Creatinine, Ser 1.27 (*)    Total Bilirubin 1.7 (*)    All other components within normal limits  CBC  WITH DIFFERENTIAL/PLATELET - Abnormal; Notable for the following components:   RBC 4.10 (*)    Hemoglobin 11.6 (*)    HCT 36.0 (*)    All other components within normal limits    EKG None  Radiology No results found.  Procedures Procedures    Medications Ordered in ED Medications  lidocaine (XYLOCAINE) 2 % jelly 1 Application (1 Application Topical Given 01/24/23 0231)    ED Course/ Medical Decision Making/ A&P                             Medical Decision Making Risk Prescription drug management.   Patient here for evaluation of hematochezia.  Hemoglobin is at his baseline.  He does have 2 external hemorrhoids on examination with evidence of recent bleeding.  No evidence of perirectal abscess at this time.   Discussed with patient home care for hemorrhoid with sitz baths.  Will provide Xylocaine to use at home if he has pain.  Will prescribe Anusol HC for his hemorrhoids with outpatient follow-up and return precautions.        Final Clinical Impression(s) / ED Diagnoses Final diagnoses:  Hemorrhoids, unspecified hemorrhoid type    Rx / DC Orders ED Discharge Orders          Ordered    hydrocortisone (ANUSOL-HC) 2.5 % rectal cream  2 times daily        01/24/23 0226              Tilden Fossa, MD 01/24/23 [id number]

## 2023-03-25 ENCOUNTER — Encounter (HOSPITAL_COMMUNITY): Payer: Self-pay | Admitting: Emergency Medicine

## 2023-03-25 ENCOUNTER — Ambulatory Visit (HOSPITAL_COMMUNITY)
Admission: EM | Admit: 2023-03-25 | Discharge: 2023-03-25 | Disposition: A | Discharge status: Home / Self Care | Payer: Self-pay | Attending: Emergency Medicine | Admitting: Emergency Medicine

## 2023-03-25 ENCOUNTER — Ambulatory Visit (INDEPENDENT_AMBULATORY_CARE_PROVIDER_SITE_OTHER): Payer: Self-pay

## 2023-03-25 ENCOUNTER — Encounter (HOSPITAL_COMMUNITY): Payer: Self-pay

## 2023-03-25 DIAGNOSIS — M19072 Primary osteoarthritis, left ankle and foot: Secondary | ICD-10-CM | POA: Insufficient documentation

## 2023-03-25 DIAGNOSIS — I10 Essential (primary) hypertension: Secondary | ICD-10-CM | POA: Insufficient documentation

## 2023-03-25 DIAGNOSIS — R7989 Other specified abnormal findings of blood chemistry: Secondary | ICD-10-CM | POA: Insufficient documentation

## 2023-03-25 DIAGNOSIS — M25572 Pain in left ankle and joints of left foot: Secondary | ICD-10-CM | POA: Insufficient documentation

## 2023-03-25 LAB — CBC
HCT: 35.5 % — ABNORMAL LOW (ref 39.0–52.0)
Hemoglobin: 11.4 g/dL — ABNORMAL LOW (ref 13.0–17.0)
MCH: 27.4 pg (ref 26.0–34.0)
MCHC: 32.1 g/dL (ref 30.0–36.0)
MCV: 85.3 fL (ref 80.0–100.0)
Platelets: 276 10*3/uL (ref 150–400)
RBC: 4.16 MIL/uL — ABNORMAL LOW (ref 4.22–5.81)
RDW: 15.2 % (ref 11.5–15.5)
WBC: 9.9 10*3/uL (ref 4.0–10.5)
nRBC: 0 % (ref 0.0–0.2)

## 2023-03-25 LAB — BASIC METABOLIC PANEL
Anion gap: 9 (ref 5–15)
BUN: 21 mg/dL — ABNORMAL HIGH (ref 6–20)
CO2: 24 mmol/L (ref 22–32)
Calcium: 8.7 mg/dL — ABNORMAL LOW (ref 8.9–10.3)
Chloride: 105 mmol/L (ref 98–111)
Creatinine, Ser: 1.3 mg/dL — ABNORMAL HIGH (ref 0.61–1.24)
GFR, Estimated: >60 mL/min (ref >60)
Glucose, Bld: 106 mg/dL — ABNORMAL HIGH (ref 70–99)
Potassium: 3.8 mmol/L (ref 3.5–5.1)
Sodium: 138 mmol/L (ref 135–145)

## 2023-03-25 MED ORDER — AMLODIPINE BESYLATE 10 MG PO TABS: 10.0000 mg | ORAL_TABLET | Freq: Every day | ORAL | 2 refills | Status: DC

## 2023-03-25 MED ORDER — NAPROXEN 500 MG PO TABS: 500.0000 mg | ORAL_TABLET | Freq: Two times a day (BID) | ORAL | 0 refills | Status: DC

## 2023-03-25 NOTE — ED Provider Notes (Signed)
HPI  SUBJECTIVE:  Jeremy Hood is a 47 y.o. male who presents with 6 days of constant, throbbing medial left ankle pain.  He reports swelling, bruising, erythema, limitation of motion secondary to the pain starting several days ago.  He reports needles like sensation in his foot and toes.  He denies trauma, fall, twisting his ankle, change in his physical activity.  He denies recent red meat ingestion.  No fevers.  He tried Goody's, Tylenol PM, an unknown anti-inflammatory, ice, elevation.  Tylenol PM helps him sleep and the ice helps.  Symptoms worse with weightbearing and with movement.  He states this does not feel like gout, which is usually located in his right first toe.  Second, he is requesting a refill on his amlodipine.  He states that he has not taken it since running out of his last refill at the urgent care on 05/03/2022.  He does not measure his blood pressure at home, but he does have a machine.  He states that he has not had any problems with the amlodipine.  He has a past medical history of hypertension on amlodipine 10 mg daily, gout, septic embolism, marijuana and cocaine use.  Last use of cocaine was over the weekend.  No history of MRSA, left ankle injury, chronic kidney disease, MI, CVA, aneurysm.  PCP: None.    Past Medical History:  Diagnosis Date   Chest pain 11/2016   Hypertension     Past Surgical History:  Procedure Laterality Date   ADENOIDECTOMY     TEE WITHOUT CARDIOVERSION N/A 11/26/2016   Procedure: TRANSESOPHAGEAL ECHOCARDIOGRAM (TEE);  Surgeon: Vesta Mixer, MD;  Location: [facility] ENDOSCOPY;  Service: Cardiovascular;  Laterality: N/A;   VIDEO BRONCHOSCOPY Bilateral 12/02/2016   Procedure: VIDEO BRONCHOSCOPY WITHOUT FLUORO;  Surgeon: Nelda Bucks, MD;  Location: [facility] ENDOSCOPY;  Service: Cardiopulmonary;  Laterality: Bilateral;    Family History  Problem Relation Age of Onset   Hypertension Mother    Diabetes Mother     Social History   Tobacco  Use   Smoking status: Every Day    Packs/day: 0.50    Years: 20.00    Additional pack years: 0.00    Total pack years: 10.00    Types: Cigarettes    Last attempt to quit: 11/23/2016    Years since quitting: 6.3   Smokeless tobacco: Never  Vaping Use   Vaping Use: Never used  Substance Use Topics   Alcohol use: No    Comment: last drink in january 2017   Drug use: Yes    Types: Marijuana    Comment: last night-daily    No current facility-administered medications for this encounter.  Current Outpatient Medications:    naproxen (NAPROSYN) 500 MG tablet, Take 1 tablet (500 mg total) by mouth 2 (two) times daily., Disp: 20 tablet, Rfl: 0   amLODipine (NORVASC) 10 MG tablet, Take 1 tablet (10 mg total) by mouth daily., Disp: 30 tablet, Rfl: 2   hydrocortisone (ANUSOL-HC) 2.5 % rectal cream, Place 1 Application rectally 2 (two) times daily., Disp: 30 g, Rfl: 0  Allergies  Allergen Reactions   Lisinopril Other (See Comments)    Angioedema      ROS  As noted in HPI.   Physical Exam  BP (!) 192/121   Pulse 98   Temp 99.3 F (37.4 C) (Oral)   Resp 18   SpO2 100%  BP Readings from Last 3 Encounters:  03/25/23 (!) 192/121  01/24/23 (!) 170/123  05/03/22 (!) 164/104   Repeat blood pressure 192/121  Constitutional: Well developed, well nourished, no acute distress Eyes:  EOMI, conjunctiva normal bilaterally HENT: Normocephalic, atraumatic,mucus membranes moist Respiratory: Normal inspiratory effort, lungs clear bilaterally Cardiovascular: Normal rate, regular rhythm, no murmurs rubs or gallops GI: nondistended skin: No rash, skin intact over ankle Musculoskeletal: L Ankle: Proximal fibula NT , Distal fibula NT , Medial malleolus tender,  Deltoid ligaments medially tender,  ATFL NT, calcaneofibular ligament NT , posterior tablofibular ligament nontender,  Achilles NT, calcaneus T ,  Proximal 5th metatarsal NT, Midfoot NT, distal NVI with baseline sensation / motor to foot  with DP 2+. no pain with dorsiflexion/plantar flexion. Pain  with inversion/eversion.  No appreciable bruising, erythema.  Positive soft tissue swelling medially with increased temperature. Pt able to bear weight in dept.   No lower extremity edema Neurologic: Alert & oriented x 3, no focal neuro deficits Psychiatric: Speech and behavior appropriate   ED Course   Medications - No data to display  Orders Placed This Encounter  Procedures   DG Ankle Complete Left    Standing Status:   Standing    Number of Occurrences:   1    Order Specific Question:   Reason for Exam (SYMPTOM  OR DIAGNOSIS REQUIRED)    Answer:   medial pain swelling tenderness r/o fx, effusion   CBC    Standing Status:   Standing    Number of Occurrences:   1   Basic metabolic panel    Standing Status:   Standing    Number of Occurrences:   1   Nursing Communication Please set up with PCP prior to discharge    Please set up with PCP prior to discharge    Standing Status:   Standing    Number of Occurrences:   1    Results for orders placed or performed during the hospital encounter of 03/25/23 (from the past 24 hour(s))  CBC     Status: Abnormal   Collection Time: 03/25/23  5:51 PM  Result Value Ref Range   WBC 9.9 4.0 - 10.5 K/uL   RBC 4.16 (L) 4.22 - 5.81 MIL/uL   Hemoglobin 11.4 (L) 13.0 - 17.0 g/dL   HCT 16.1 (L) 09.6 - 04.5 %   MCV 85.3 80.0 - 100.0 fL   MCH 27.4 26.0 - 34.0 pg   MCHC 32.1 30.0 - 36.0 g/dL   RDW 40.9 81.1 - 91.4 %   Platelets 276 150 - 400 K/uL   nRBC 0.0 0.0 - 0.2 %  Basic metabolic panel     Status: Abnormal   Collection Time: 03/25/23  5:51 PM  Result Value Ref Range   Sodium 138 135 - 145 mmol/L   Potassium 3.8 3.5 - 5.1 mmol/L   Chloride 105 98 - 111 mmol/L   CO2 24 22 - 32 mmol/L   Glucose, Bld 106 (H) 70 - 99 mg/dL   BUN 21 (H) 6 - 20 mg/dL   Creatinine, Ser 7.82 (H) 0.61 - 1.24 mg/dL   Calcium 8.7 (L) 8.9 - 10.3 mg/dL   GFR, Estimated >95 >62 mL/min   Anion gap 9 5  - 15   DG Ankle Complete Left  Result Date: 03/25/2023 CLINICAL DATA:  Medial pain, swelling, and tenderness. Symptoms since Saturday. No injury. History of gout. EXAM: LEFT ANKLE COMPLETE - 3+ VIEW COMPARISON:  None Available. FINDINGS: Degenerative changes in the left ankle joint with prominent osteophytes on the medial malleolus.  No periarticular erosions to suggest gout changes. No evidence of acute fracture or dislocation. Joint spaces are normal. Mild soft tissue swelling medially. IMPRESSION: Degenerative changes in the left ankle. No acute bony abnormalities. Soft tissue swelling medially. Electronically Signed   By: Burman Nieves M.D.   On: 03/25/2023 18:04    ED Clinical Impression  1. Acute left ankle pain   2. Arthritis of left ankle   3. Elevated serum creatinine   4. Uncontrolled hypertension      ED Assessment/Plan     1.  Left ankle pain.  Patient has localized heat, tenderness, edema medially.  He has bony tenderness, so will x-ray ankle to rule out any fracture.  I suspect that he has a cellulitis.  Doubt gout or septic joint as it is localized to the medial ankle.  He has never had gout in his left ankle.Marland Kitchen  He has pain with inversion/eversion, none with dorsiflexion/plantarflexion.  Will also check a CBC and a BMP to look for leukocytosis, thrombocytopenia, acute kidney injury due to the uncontrolled hypertension.  I plan to send him home with doxycycline for 7 days.  Naprosyn/Tylenol twice daily.  May take an additional gram of Tylenol 1 more time during the day.  Reviewed imaging independently.  Degenerative changes left ankle.  Left tissue swelling medially.  See radiology report for full details.  Patient has no effusion or acute bony abnormalities.  He has some arthritis and soft tissue swelling.    2.  Uncontrolled hypertension -  Pt hypertensive today.  Patient has not been checking his blood pressure at home.  He has not taken his blood pressure medication for  "months".  His last refill was in 04/2022.  He also appears to be in a moderate amount of pain.  He declined Toradol, Tylenol here.  Pt has no historical evidence of end organ damage. Pt denies any CNS type sx such as HA, visual changes, focal paresis, or new onset seizure activity. Pt denies any CV sx such as CP, dyspnea, palpitations, pedal edema, tearing pain radiating to back or abd. Pt denied any renal sx such as anuria or hematuria. Pt admits to recent cocaine use, none in 3 to 4 days.  No recent use of OTC medications such as nasal decongestants. Discussed importance of stopping the cocaine, and the importance of taking usual BP medications. Pt to f/u as OP.  Staff was able to set him up with an appointment with [facility] on August 19.  He will follow-up here in 2 weeks for blood pressure recheck.  Labs reviewed.  No white count.  Elevated creatinine, but last creatinine 1.27 2 months ago.  I suspect this is damage from his uncontrolled blood pressure.  Suggest that we recheck this when he returns in 2 weeks for blood pressure check. Sent patient note stating to not use the Naprosyn, but to use Tylenol only due to the elevated creatinine.  Forgot to prescribe doxycycline at discharge.  Will E prescribe it now and have staff contact patient to let him know that the antibiotics were prescribed and are waiting for him at the pharmacy.  Will also send a note to the patient.  Discussed  imaging, MDM, treatment plan, and plan for follow-up with patient. Discussed sn/sx that should prompt return to the ED. patient agrees with plan.   Meds ordered this encounter  Medications   amLODipine (NORVASC) 10 MG tablet    Sig: Take 1 tablet (10 mg total) by mouth daily.  Dispense:  30 tablet    Refill:  2   naproxen (NAPROSYN) 500 MG tablet    Sig: Take 1 tablet (500 mg total) by mouth 2 (two) times daily.    Dispense:  20 tablet    Refill:  0      *This clinic note was created using [employer]. Therefore, there may be occasional mistakes despite careful proofreading.  ?    Domenick Gong, MD 03/26/23 [phone]

## 2023-03-25 NOTE — ED Triage Notes (Addendum)
Pt c/o rt ankle swelling, redness, and head since Saturday night. Denies injury, hx of gout. Took OTC with no relief. Pt requesting a refill on his b/p meds. States has been out for 2 months.

## 2023-03-25 NOTE — Discharge Instructions (Signed)
Your ankle x-ray shows that you have some arthritis in your ankle.  I am still concerned that you may have a skin infection.  Finish the doxycycline, even if you feel better.  You may take the Naprosyn with 1000 mg of plain Tylenol in the morning, and additional 1000 mg of plain Tylenol midday, and the Naprosyn with Tylenol PM at night to help you sleep.  I will contact you if and only if your labs come back abnormal.  Decrease your salt intake. diet and exercise will lower your blood pressure significantly.  Please stop the cocaine.  It is important to keep your blood pressure under good control, as having a elevated blood pressure for prolonged periods of time significantly increases your risk of stroke, heart attacks, kidney damage, eye damage, and other problems. Get a validated blood pressure cuff that goes on your arm, not your wrist.  Measure your blood pressure once a day, preferably at the same time every day. Keep a log of this and bring it to your next doctor's appointment.  Bring your blood pressure cuff as well.  Return here in 2 weeks for blood pressure recheck. Return immediately to the ER if you start having chest pain, headache, problems seeing, problems talking, problems walking, if you feel like you're about to pass out, if you do pass out, if you have a seizure, or for any other concerns.  Go to www.goodrx.com  or www.costplusdrugs.com to look up your medications. This will give you a list of where you can find your prescriptions at the most affordable prices. Or ask the pharmacist what the cash price is, or if they have any other discount programs available to help make your medication more affordable. This can be less expensive than what you would pay with insurance.

## 2023-03-26 ENCOUNTER — Telehealth (HOSPITAL_COMMUNITY): Payer: Self-pay | Admitting: Emergency Medicine

## 2023-03-26 ENCOUNTER — Encounter (HOSPITAL_COMMUNITY): Payer: Self-pay | Admitting: Emergency Medicine

## 2023-03-26 MED ORDER — DOXYCYCLINE HYCLATE 100 MG PO CAPS: 100.0000 mg | ORAL_CAPSULE | Freq: Two times a day (BID) | ORAL | 0 refills | Status: AC

## 2023-03-26 NOTE — Telephone Encounter (Signed)
Forgot to prescribe doxycycline at discharge.  Will E prescribe doxycycline 100 mg p.o. twice daily for possible ankle cellulitis to pharmacy on record.  Will have staff contact patient and let him know avoiding prescription.  Will also send patient note.

## 2023-06-09 ENCOUNTER — Ambulatory Visit: Payer: Self-pay | Admitting: Family Medicine

## 2023-09-25 ENCOUNTER — Ambulatory Visit (INDEPENDENT_AMBULATORY_CARE_PROVIDER_SITE_OTHER): Payer: Self-pay

## 2023-09-25 ENCOUNTER — Encounter (HOSPITAL_COMMUNITY): Payer: Self-pay | Admitting: *Deleted

## 2023-09-25 ENCOUNTER — Ambulatory Visit (HOSPITAL_COMMUNITY)
Admission: EM | Admit: 2023-09-25 | Discharge: 2023-09-25 | Disposition: A | Discharge status: Home / Self Care | Payer: Self-pay | Attending: Family Medicine | Admitting: Family Medicine

## 2023-09-25 DIAGNOSIS — J189 Pneumonia, unspecified organism: Secondary | ICD-10-CM

## 2023-09-25 DIAGNOSIS — I10 Essential (primary) hypertension: Secondary | ICD-10-CM

## 2023-09-25 MED ORDER — BENZONATATE 100 MG PO CAPS: 100.0000 mg | ORAL_CAPSULE | Freq: Three times a day (TID) | ORAL | 0 refills | Status: DC | PRN

## 2023-09-25 MED ORDER — DOXYCYCLINE HYCLATE 100 MG PO CAPS: 100.0000 mg | ORAL_CAPSULE | Freq: Two times a day (BID) | ORAL | 0 refills | Status: DC

## 2023-09-25 MED ORDER — AMLODIPINE BESYLATE 10 MG PO TABS: 10.0000 mg | ORAL_TABLET | Freq: Every day | ORAL | 2 refills | Status: DC

## 2023-09-25 NOTE — ED Triage Notes (Signed)
Pt states he felt like he had the flu a couple weeks ago and he isnt able to get rid of this cough. He feels chest tightness with activity and coughing. He has been taking cough meds without relief.     He states he has also been out of his BP meds for while and would like a refill he takes norvasc 10mg .

## 2023-09-25 NOTE — Discharge Instructions (Signed)
There is pneumonia on your right lower lung on the x-ray.  Also your heart is enlarged some  The radiologist will also read your x-ray, and if their interpretation differs significantly from mine and it would change her management, we will call you.  Take doxycycline 100 mg --1 capsule 2 times daily for 7 days  Take benzonatate 100 mg, 1 tab every 8 hours as needed for cough.  Amlodipine 10 mg--1 daily for blood pressure  You can use the QR code/website at the back of the summary paperwork to schedule yourself a new patient appointment with primary care

## 2023-09-25 NOTE — ED Provider Notes (Signed)
[facility]    CSN: [id number] Arrival date & time: 09/25/23  1437      History   Chief Complaint Chief Complaint  Patient presents with   Cough   Chest Pain   Medication Refill    HPI Jeremy Hood is a 47 y.o. male.    Cough Associated symptoms: chest pain   Chest Pain Associated symptoms: cough   Medication Refill Here with cough and chest tightness it has been going on for about 2 or 3 weeks.  Initially had fever for a few days between 101 and 102.  He also had some nasal drainage and that is resolved.  It is hurting in his chest some when he coughs  He has never been diagnosed with asthma or had to use an inhaler in the past  He is allergic to lisinopril  He has hypertension and his blood pressure today is 177/136.  He has been out of his amlodipine.  He does not have a primary care provider  Past Medical History:  Diagnosis Date   Chest pain 11/2016   Hypertension     Patient Active Problem List   Diagnosis Date Noted   Anemia 12/17/2016   Obesity (BMI 35.0-39.9 without comorbidity) 12/10/2016   Acute respiratory failure with hypoxia (HCC)    Loosening of tooth    Cavitary lung disease    Dental abscess    Abnormal chest x-ray with multiple lung nodules    Liver lesion    Tobacco abuse    Cocaine abuse (HCC)    Lung mass 11/26/2016   Substance abuse, coacaine and marijuana  11/26/2016   Cough    Septic embolism (HCC)    Chest pain 11/23/2016   Costochondritis 11/23/2016   HTN (hypertension) 11/23/2016    Past Surgical History:  Procedure Laterality Date   ADENOIDECTOMY     TEE WITHOUT CARDIOVERSION N/A 11/26/2016   Procedure: TRANSESOPHAGEAL ECHOCARDIOGRAM (TEE);  Surgeon: Vesta Mixer, MD;  Location: [facility] ENDOSCOPY;  Service: Cardiovascular;  Laterality: N/A;   VIDEO BRONCHOSCOPY Bilateral 12/02/2016   Procedure: VIDEO BRONCHOSCOPY WITHOUT FLUORO;  Surgeon: Nelda Bucks, MD;  Location: [facility] ENDOSCOPY;  Service:  Cardiopulmonary;  Laterality: Bilateral;       Home Medications    Prior to Admission medications   Medication Sig Start Date End Date Taking? Authorizing Provider  benzonatate (TESSALON) 100 MG capsule Take 1 capsule (100 mg total) by mouth 3 (three) times daily as needed for cough. 09/25/23  Yes Zenia Resides, MD  doxycycline (VIBRAMYCIN) 100 MG capsule Take 1 capsule (100 mg total) by mouth 2 (two) times daily. 09/25/23  Yes Zenia Resides, MD  amLODipine (NORVASC) 10 MG tablet Take 1 tablet (10 mg total) by mouth daily. 09/25/23   Zenia Resides, MD  hydrocortisone (ANUSOL-HC) 2.5 % rectal cream Place 1 Application rectally 2 (two) times daily. 01/24/23   Tilden Fossa, MD  lisinopril (ZESTRIL) 10 MG tablet Take 1 tablet (10 mg total) by mouth daily. 03/15/20 02/09/21  Hall-Potvin, [place], PA-C    Family History Family History  Problem Relation Age of Onset   Hypertension Mother    Diabetes Mother     Social History Social History   Tobacco Use   Smoking status: Every Day    Current packs/day: 0.00    Average packs/day: 0.5 packs/day for 20.0 years (10.0 ttl pk-yrs)    Types: Cigarettes    Start date: 11/23/1996    Last attempt to quit:  11/23/2016    Years since quitting: 6.8   Smokeless tobacco: Never  Vaping Use   Vaping status: Never Used  Substance Use Topics   Alcohol use: Not Currently    Comment: last drink in january 2017   Drug use: Yes    Types: Marijuana     Allergies   Lisinopril   Review of Systems Review of Systems  Respiratory:  Positive for cough.   Cardiovascular:  Positive for chest pain.     Physical Exam Triage Vital Signs ED Triage Vitals  Encounter Vitals Group     BP 09/25/23 1522 (!) 177/136     Systolic BP Percentile --      Diastolic BP Percentile --      Pulse Rate 09/25/23 1522 89     Resp 09/25/23 1522 20     Temp 09/25/23 1522 98.2 F (36.8 C)     Temp Source 09/25/23 1522 Oral     SpO2 09/25/23 1522 98 %      Weight --      Height --      Head Circumference --      Peak Flow --      Pain Score 09/25/23 1520 5     Pain Loc --      Pain Education --      Exclude from Growth Chart --    No data found.  Updated Vital Signs BP (!) 177/136 (BP Location: Left Arm)   Pulse 89   Temp 98.2 F (36.8 C) (Oral)   Resp 20   SpO2 98%   Visual Acuity Right Eye Distance:   Left Eye Distance:   Bilateral Distance:    Right Eye Near:   Left Eye Near:    Bilateral Near:     Physical Exam Vitals reviewed.  Constitutional:      General: He is not in acute distress.    Appearance: He is not ill-appearing, toxic-appearing or diaphoretic.  HENT:     Nose: Nose normal.     Mouth/Throat:     Mouth: Mucous membranes are moist.     Pharynx: No oropharyngeal exudate or posterior oropharyngeal erythema.  Eyes:     Extraocular Movements: Extraocular movements intact.     Conjunctiva/sclera: Conjunctivae normal.     Pupils: Pupils are equal, round, and reactive to light.  Cardiovascular:     Rate and Rhythm: Normal rate and regular rhythm.     Heart sounds: No murmur heard. Pulmonary:     Effort: No respiratory distress.     Breath sounds: No stridor. No wheezing, rhonchi or rales.  Musculoskeletal:     Cervical back: Neck supple.  Lymphadenopathy:     Cervical: No cervical adenopathy.  Skin:    Capillary Refill: Capillary refill takes less than 2 seconds.     Coloration: Skin is not jaundiced or pale.  Neurological:     General: No focal deficit present.     Mental Status: He is alert and oriented to person, place, and time.  Psychiatric:        Behavior: Behavior normal.      UC Treatments / Results  Labs (all labs ordered are listed, but only abnormal results are displayed) Labs Reviewed - No data to display  EKG   Radiology No results found.  Procedures Procedures (including critical care time)  Medications Ordered in UC Medications - No data to display  Initial  Impression / Assessment and Plan / UC Course  I  have reviewed the triage vital signs and the nursing notes.  Pertinent labs & imaging results that were available during my care of the patient were reviewed by me and considered in my medical decision making (see chart for details).     Chest x-ray by my review shows a right lower lobe infiltrate.  There is also cardiomegaly.  Doxycycline is sent in to treat the pneumonia his amlodipine is also refilled for the hypertension.  Tessalon Perles were sent in for the cough  He is given instructions on how to set up primary care appointment   Final Clinical Impressions(s) / UC Diagnoses   Final diagnoses:  Community acquired pneumonia of right lower lobe of lung  Essential hypertension     Discharge Instructions      There is pneumonia on your right lower lung on the x-ray.  Also your heart is enlarged some  The radiologist will also read your x-ray, and if their interpretation differs significantly from mine and it would change her management, we will call you.  Take doxycycline 100 mg --1 capsule 2 times daily for 7 days  Take benzonatate 100 mg, 1 tab every 8 hours as needed for cough.  Amlodipine 10 mg--1 daily for blood pressure  You can use the QR code/website at the back of the summary paperwork to schedule yourself a new patient appointment with primary care       ED Prescriptions     Medication Sig Dispense Auth. Provider   amLODipine (NORVASC) 10 MG tablet Take 1 tablet (10 mg total) by mouth daily. 30 tablet Miroslava Santellan, Janace Aris, MD   doxycycline (VIBRAMYCIN) 100 MG capsule Take 1 capsule (100 mg total) by mouth 2 (two) times daily. 20 capsule Zenia Resides, MD   benzonatate (TESSALON) 100 MG capsule Take 1 capsule (100 mg total) by mouth 3 (three) times daily as needed for cough. 21 capsule Zenia Resides, MD      PDMP not reviewed this encounter.   Zenia Resides, MD 09/25/23 [id number]

## 2024-04-27 ENCOUNTER — Inpatient Hospital Stay (HOSPITAL_COMMUNITY)
Admission: EM | Admit: 2024-04-27 | Discharge: 2024-05-06 | DRG: 291 | Disposition: A | Discharge status: Home / Self Care | Payer: Self-pay | Attending: Internal Medicine | Admitting: Internal Medicine

## 2024-04-27 ENCOUNTER — Ambulatory Visit (INDEPENDENT_AMBULATORY_CARE_PROVIDER_SITE_OTHER): Payer: Self-pay

## 2024-04-27 ENCOUNTER — Encounter (HOSPITAL_COMMUNITY): Payer: Self-pay

## 2024-04-27 ENCOUNTER — Encounter (HOSPITAL_COMMUNITY): Payer: Self-pay | Admitting: Internal Medicine

## 2024-04-27 ENCOUNTER — Other Ambulatory Visit: Payer: Self-pay

## 2024-04-27 ENCOUNTER — Ambulatory Visit (HOSPITAL_COMMUNITY)
Admission: EM | Admit: 2024-04-27 | Discharge: 2024-04-27 | Disposition: A | Discharge status: Home / Self Care | Payer: Self-pay | Attending: Emergency Medicine | Admitting: Emergency Medicine

## 2024-04-27 DIAGNOSIS — I130 Hypertensive heart and chronic kidney disease with heart failure and stage 1 through stage 4 chronic kidney disease, or unspecified chronic kidney disease: Principal | ICD-10-CM | POA: Diagnosis present

## 2024-04-27 DIAGNOSIS — I110 Hypertensive heart disease with heart failure: Secondary | ICD-10-CM

## 2024-04-27 DIAGNOSIS — D509 Iron deficiency anemia, unspecified: Secondary | ICD-10-CM | POA: Diagnosis present

## 2024-04-27 DIAGNOSIS — R3589 Other polyuria: Secondary | ICD-10-CM

## 2024-04-27 DIAGNOSIS — I452 Bifascicular block: Secondary | ICD-10-CM | POA: Diagnosis present

## 2024-04-27 DIAGNOSIS — R0602 Shortness of breath: Secondary | ICD-10-CM

## 2024-04-27 DIAGNOSIS — Z5971 Insufficient health insurance coverage: Secondary | ICD-10-CM

## 2024-04-27 DIAGNOSIS — Z888 Allergy status to other drugs, medicaments and biological substances status: Secondary | ICD-10-CM

## 2024-04-27 DIAGNOSIS — N179 Acute kidney failure, unspecified: Secondary | ICD-10-CM | POA: Diagnosis present

## 2024-04-27 DIAGNOSIS — I509 Heart failure, unspecified: Principal | ICD-10-CM

## 2024-04-27 DIAGNOSIS — I5021 Acute systolic (congestive) heart failure: Secondary | ICD-10-CM | POA: Diagnosis present

## 2024-04-27 DIAGNOSIS — F1410 Cocaine abuse, uncomplicated: Secondary | ICD-10-CM | POA: Diagnosis present

## 2024-04-27 DIAGNOSIS — Z823 Family history of stroke: Secondary | ICD-10-CM

## 2024-04-27 DIAGNOSIS — Z720 Tobacco use: Secondary | ICD-10-CM | POA: Diagnosis present

## 2024-04-27 DIAGNOSIS — T501X5A Adverse effect of loop [high-ceiling] diuretics, initial encounter: Secondary | ICD-10-CM | POA: Diagnosis present

## 2024-04-27 DIAGNOSIS — Z6841 Body Mass Index (BMI) 40.0 and over, adult: Secondary | ICD-10-CM

## 2024-04-27 DIAGNOSIS — Z5986 Financial insecurity: Secondary | ICD-10-CM

## 2024-04-27 DIAGNOSIS — Z79899 Other long term (current) drug therapy: Secondary | ICD-10-CM

## 2024-04-27 DIAGNOSIS — K921 Melena: Secondary | ICD-10-CM | POA: Diagnosis not present

## 2024-04-27 DIAGNOSIS — E662 Morbid (severe) obesity with alveolar hypoventilation: Secondary | ICD-10-CM | POA: Diagnosis present

## 2024-04-27 DIAGNOSIS — Z7984 Long term (current) use of oral hypoglycemic drugs: Secondary | ICD-10-CM

## 2024-04-27 DIAGNOSIS — F17210 Nicotine dependence, cigarettes, uncomplicated: Secondary | ICD-10-CM

## 2024-04-27 DIAGNOSIS — R519 Headache, unspecified: Secondary | ICD-10-CM | POA: Diagnosis present

## 2024-04-27 DIAGNOSIS — I10 Essential (primary) hypertension: Secondary | ICD-10-CM

## 2024-04-27 DIAGNOSIS — R7303 Prediabetes: Secondary | ICD-10-CM | POA: Diagnosis present

## 2024-04-27 DIAGNOSIS — Z833 Family history of diabetes mellitus: Secondary | ICD-10-CM

## 2024-04-27 DIAGNOSIS — R0609 Other forms of dyspnea: Secondary | ICD-10-CM

## 2024-04-27 DIAGNOSIS — Z8249 Family history of ischemic heart disease and other diseases of the circulatory system: Secondary | ICD-10-CM

## 2024-04-27 DIAGNOSIS — N1832 Chronic kidney disease, stage 3b: Secondary | ICD-10-CM | POA: Diagnosis present

## 2024-04-27 DIAGNOSIS — R0600 Dyspnea, unspecified: Secondary | ICD-10-CM

## 2024-04-27 DIAGNOSIS — R351 Nocturia: Secondary | ICD-10-CM | POA: Diagnosis present

## 2024-04-27 DIAGNOSIS — I340 Nonrheumatic mitral (valve) insufficiency: Secondary | ICD-10-CM | POA: Diagnosis present

## 2024-04-27 DIAGNOSIS — N1831 Chronic kidney disease, stage 3a: Secondary | ICD-10-CM

## 2024-04-27 DIAGNOSIS — I2489 Other forms of acute ischemic heart disease: Secondary | ICD-10-CM | POA: Diagnosis present

## 2024-04-27 DIAGNOSIS — R9431 Abnormal electrocardiogram [ECG] [EKG]: Secondary | ICD-10-CM

## 2024-04-27 DIAGNOSIS — I5020 Unspecified systolic (congestive) heart failure: Secondary | ICD-10-CM

## 2024-04-27 LAB — CBC
HCT: 34.7 % — ABNORMAL LOW (ref 39.0–52.0)
Hemoglobin: 11 g/dL — ABNORMAL LOW (ref 13.0–17.0)
MCH: 27.6 pg (ref 26.0–34.0)
MCHC: 31.7 g/dL (ref 30.0–36.0)
MCV: 87.2 fL (ref 80.0–100.0)
Platelets: 279 K/uL (ref 150–400)
RBC: 3.98 MIL/uL — ABNORMAL LOW (ref 4.22–5.81)
RDW: 15.2 % (ref 11.5–15.5)
WBC: 7.7 K/uL (ref 4.0–10.5)
nRBC: 0 % (ref 0.0–0.2)

## 2024-04-27 LAB — BASIC METABOLIC PANEL WITH GFR
Anion gap: 16 — ABNORMAL HIGH (ref 5–15)
Anion gap: 14 (ref 5–15)
BUN: 23 mg/dL — ABNORMAL HIGH (ref 6–20)
BUN: 22 mg/dL — ABNORMAL HIGH (ref 6–20)
CO2: 21 mmol/L — ABNORMAL LOW (ref 22–32)
CO2: 23 mmol/L (ref 22–32)
Calcium: 8.9 mg/dL (ref 8.9–10.3)
Calcium: 8.9 mg/dL (ref 8.9–10.3)
Chloride: 106 mmol/L (ref 98–111)
Chloride: 105 mmol/L (ref 98–111)
Creatinine, Ser: 1.45 mg/dL — ABNORMAL HIGH (ref 0.61–1.24)
Creatinine, Ser: 1.71 mg/dL — ABNORMAL HIGH (ref 0.61–1.24)
GFR, Estimated: 60 mL/min — ABNORMAL LOW (ref >60)
GFR, Estimated: 49 mL/min — ABNORMAL LOW (ref >60)
Glucose, Bld: 134 mg/dL — ABNORMAL HIGH (ref 70–99)
Glucose, Bld: 108 mg/dL — ABNORMAL HIGH (ref 70–99)
Potassium: 3.4 mmol/L — ABNORMAL LOW (ref 3.5–5.1)
Potassium: 3.3 mmol/L — ABNORMAL LOW (ref 3.5–5.1)
Sodium: 143 mmol/L (ref 135–145)
Sodium: 142 mmol/L (ref 135–145)

## 2024-04-27 LAB — LIPID PANEL
Cholesterol: 126 mg/dL (ref 0–200)
HDL: 31 mg/dL — ABNORMAL LOW (ref >40)
LDL Cholesterol: 56 mg/dL (ref 0–99)
Total CHOL/HDL Ratio: 4.1 ratio
Triglycerides: 196 mg/dL — ABNORMAL HIGH (ref <150)
VLDL: 39 mg/dL (ref 0–40)

## 2024-04-27 LAB — TROPONIN I (HIGH SENSITIVITY)
Troponin I (High Sensitivity): 137 ng/L (ref <18)
Troponin I (High Sensitivity): 127 ng/L (ref <18)

## 2024-04-27 LAB — BRAIN NATRIURETIC PEPTIDE: B Natriuretic Peptide: 1356.9 pg/mL — ABNORMAL HIGH (ref 0.0–100.0)

## 2024-04-27 LAB — MRSA NEXT GEN BY PCR, NASAL: MRSA by PCR Next Gen: NOT DETECTED

## 2024-04-27 LAB — HIV ANTIBODY (ROUTINE TESTING W REFLEX): HIV Screen 4th Generation wRfx: NONREACTIVE

## 2024-04-27 MED ORDER — AMLODIPINE BESYLATE 10 MG PO TABS: 10.0000 mg | ORAL_TABLET | Freq: Every day | ORAL | Status: DC

## 2024-04-27 MED ORDER — AMLODIPINE BESYLATE 5 MG PO TABS
10.0000 mg | ORAL_TABLET | Freq: Once | ORAL | Status: AC
  Administered 2024-04-27: 10 mg via ORAL
  Filled 2024-04-27: qty 2

## 2024-04-27 MED ORDER — CARVEDILOL 6.25 MG PO TABS
6.2500 mg | ORAL_TABLET | Freq: Two times a day (BID) | ORAL | Status: DC
  Administered 2024-04-27: 6.25 mg via ORAL
  Filled 2024-04-27: qty 1

## 2024-04-27 MED ORDER — FUROSEMIDE 10 MG/ML IJ SOLN
40.0000 mg | Freq: Once | INTRAMUSCULAR | Status: AC
  Administered 2024-04-27: 40 mg via INTRAVENOUS
  Filled 2024-04-27: qty 4

## 2024-04-27 MED ORDER — POTASSIUM CHLORIDE CRYS ER 20 MEQ PO TBCR
40.0000 meq | EXTENDED_RELEASE_TABLET | Freq: Once | ORAL | Status: AC
  Administered 2024-04-27: 40 meq via ORAL
  Filled 2024-04-27: qty 2

## 2024-04-27 MED ORDER — ASPIRIN 81 MG PO CHEW
324.0000 mg | CHEWABLE_TABLET | Freq: Once | ORAL | Status: AC
  Administered 2024-04-27: 324 mg via ORAL
  Filled 2024-04-27: qty 4

## 2024-04-27 MED ORDER — ASPIRIN 81 MG PO CHEW: 324.0000 mg | CHEWABLE_TABLET | Freq: Once | ORAL | Status: DC

## 2024-04-27 MED ORDER — RIVAROXABAN 10 MG PO TABS
10.0000 mg | ORAL_TABLET | Freq: Every day | ORAL | Status: DC
  Administered 2024-04-27 – 2024-05-06 (×10): 10 mg via ORAL
  Filled 2024-04-27 (×10): qty 1

## 2024-04-27 MED ORDER — CARVEDILOL 6.25 MG PO TABS: 6.2500 mg | ORAL_TABLET | Freq: Two times a day (BID) | ORAL | Status: DC

## 2024-04-27 NOTE — Discharge Instructions (Signed)
Please go to the emergency department for further evaluation.

## 2024-04-27 NOTE — ED Provider Notes (Signed)
 [place] EMERGENCY DEPARTMENT AT [facility] Provider Note   CSN: [id number] Arrival date & time: 04/27/24  9086     Patient presents with: Shortness of Breath, Chest Pain, and Hypertension   Jeremy Hood is a 48 y.o. male.   48 year old male presenting to the emergency department for evaluation of his shortness of breath.  He reports that he has had ongoing shortness of breath for the last several months.  He has been out of his blood pressure medication, amlodipine .  He does not have a PCP and has not seen anybody for the symptoms.  He does admit to tobacco use.  He denies any cardiac history and denies taking any other medications.  Patient states his shortness of breath was severe last night causing him to go to urgent care.  Urgent care directed him to the emergency department for further evaluation.  Patient denies any current chest discomfort.  He reports shortness of breath when attempting to walk across the house or walk through parking lot.  He reports that he only gets chest discomfort when his shortness of breath is severe.  His symptoms are worst at night when he is trying to sleep.   Shortness of Breath Chest Pain Associated symptoms: shortness of breath   Hypertension Associated symptoms include shortness of breath.       Prior to Admission medications   Medication Sig Start Date End Date Taking? Authorizing Provider  amLODipine  (NORVASC ) 10 MG tablet Take 1 tablet (10 mg total) by mouth daily. 09/25/23   Vonna Sharlet POUR, MD  benzonatate  (TESSALON ) 100 MG capsule Take 1 capsule (100 mg total) by mouth 3 (three) times daily as needed for cough. 09/25/23   Vonna Sharlet POUR, MD  doxycycline  (VIBRAMYCIN ) 100 MG capsule Take 1 capsule (100 mg total) by mouth 2 (two) times daily. 09/25/23   Banister, Pamela K, MD  hydrocortisone  (ANUSOL -HC) 2.5 % rectal cream Place 1 Application rectally 2 (two) times daily. 01/24/23   Griselda Norris, MD  lisinopril   (ZESTRIL ) 10 MG tablet Take 1 tablet (10 mg total) by mouth daily. 03/15/20 02/09/21  Hall-Potvin, [place], PA-C    Allergies: Lisinopril     Review of Systems  Respiratory:  Positive for shortness of breath.   All other systems reviewed and are negative.   Updated Vital Signs BP (!) 173/127 (BP Location: Left Arm)   Pulse 98   Temp 98.1 F (36.7 C)   Resp 18   Ht 5' 11 (1.803 m)   Wt 129.3 kg   SpO2 97%   BMI 39.75 kg/m   Physical Exam Vitals and nursing note reviewed.  Constitutional:      General: He is in acute distress.     Appearance: He is obese.  Cardiovascular:     Rate and Rhythm: Normal rate and regular rhythm.  Pulmonary:     Effort: Tachypnea and accessory muscle usage present.     Breath sounds: Decreased breath sounds present.  Abdominal:     Palpations: Abdomen is soft.  Musculoskeletal:     Right lower leg: No edema.     Left lower leg: No edema.  Skin:    General: Skin is warm.     Capillary Refill: Capillary refill takes less than 2 seconds.  Neurological:     Mental Status: He is alert and oriented to person, place, and time.     (all labs ordered are listed, but only abnormal results are displayed) Labs Reviewed  BASIC METABOLIC  PANEL WITH GFR - Abnormal; Notable for the following components:      Result Value   Potassium 3.4 (*)    CO2 21 (*)    Glucose, Bld 134 (*)    BUN 23 (*)    Creatinine, Ser 1.45 (*)    GFR, Estimated 60 (*)    Anion gap 16 (*)    All other components within normal limits  CBC - Abnormal; Notable for the following components:   RBC 3.98 (*)    Hemoglobin 11.0 (*)    HCT 34.7 (*)    All other components within normal limits  TROPONIN I (HIGH SENSITIVITY) - Abnormal; Notable for the following components:   Troponin I (High Sensitivity) 137 (*)    All other components within normal limits  BRAIN NATRIURETIC PEPTIDE  TROPONIN I (HIGH SENSITIVITY)    EKG: EKG Interpretation Date/Time:  Wednesday April 27 2024 09:22:03 EDT Ventricular Rate:  93 PR Interval:  154 QRS Duration:  152 QT Interval:  426 QTC Calculation: 529 R Axis:   -59  Text Interpretation: Normal sinus rhythm Biatrial enlargement Right bundle branch block Left anterior fascicular block Bifascicular block These are all changed from previous EKG on  11/23/2016 Confirmed by Gennaro Bouchard ([id number]) on 04/27/2024 10:43:54 AM  Radiology: [facility] Chest 2 View Result Date: 04/27/2024 CLINICAL DATA:  shortness of breath, chest tightness for months EXAM: CHEST - 2 VIEW COMPARISON:  September 25, 2023 FINDINGS: Diffuse bilateral perihilar interstitial opacities. Hazy airspace opacities in both lung bases. No pleural effusion or pneumothorax. Moderate cardiomegaly. No acute fracture or destructive lesions. IMPRESSION: Cardiomegaly with findings suggestive of pulmonary edema. Electronically Signed   By: Rogelia Myers M.D.   On: 04/27/2024 09:00     Procedures   Medications Ordered in the ED  potassium chloride  SA (KLOR-CON  M) CR tablet 40 mEq (has no administration in time range)  aspirin  chewable tablet 324 mg (has no administration in time range)  amLODipine  (NORVASC ) tablet 10 mg (has no administration in time range)                                    Medical Decision Making 48 year old male presenting to the emergency department with ongoing dyspnea with exertion, orthopnea, and uncontrolled hypertension.  Blood pressure elevated 173/127 in triage.  Prior visits reviewed in his chart showing similar elevations in his blood pressure.  He appears short of breath during conversation and after ambulation on exam.  However, his pulse ox is 97% here in the ED.  Chest x-ray from urgent care shows evidence of cardiomegaly with pulmonary edema  When comparing the EKG today versus his EKG back in 2018 -he appears to have new right bundle branch block and LAFB indicating a new bifascicular block. Patient has a normocytic anemia that appears at his  typical baseline.  He has evidence of chronic kidney disease with a creatinine elevated at 1.45.  This creatinine bump appears to be consistent with labs obtained 1 year ago.  Chart review shows the patient was seen 6 months ago for similar symptoms.  He was represcribed his amlodipine  at that time because he was hypertensive.  X-ray shows bilateral infiltrates and cardiomegaly.  Suspect this patient has had progressive worsening of his cardiomegaly/development of CHF secondary to uncontrolled hypertension for a while now.  He does not have any physicians to follow-up with including a primary care physician.Troponin elevated  at 137.  He continues to deny any chest discomfort at this time.  I suspect the troponin is likely secondary to stress in the setting of his uncontrolled hypertension, dyspnea with exertion, and likely underlying CHF.    Patient will require admission to the hospital for further cardiac evaluation.  He will need further cardiac testing to evaluate for evidence of CAD or cardiomyopathy secondary to his uncontrolled blood pressure as a cause of his congestive heart failure.  He will likely need some Lasix  to remove fluid from his lungs in order to improve his respiratory symptoms.  PE considered as part of the differential, however patient is not hypoxic and he is not tachycardic.  He has no history of blood clots and does not have any current lower extremity swelling.  Aspirin  and his home dose of amlodipine  ordered.  We will go ahead and get a dose of Lasix  ordered while we wait for his BNP.  Dx: new diagnosis of CHF with b/l pulmonary edema, dyspnea with exertion, orthopnea, uncontrolled hypertension, NSTEMI, CKD  Amount and/or Complexity of Data Reviewed Labs: ordered.  Risk OTC drugs. Prescription drug management.     Final diagnoses:  None    ED Discharge Orders     None          Emad Brechtel, DO 04/27/24 1127

## 2024-04-27 NOTE — ED Notes (Signed)
 Patient is being discharged from the Urgent Care and sent to the Emergency Department via POV . Per Rumaldo, NP, patient is in need of higher level of care due to EKG changes and SOB. Patient is aware and verbalizes understanding of plan of care.  Vitals:   04/27/24 0822  BP: (!) 196/135  Pulse: 90  Resp: 20  Temp: 98.2 F (36.8 C)  SpO2: 96%

## 2024-04-27 NOTE — ED Provider Triage Note (Signed)
 Emergency Medicine Provider Triage Evaluation Note  Jeremy Hood , a 48 y.o. male  was evaluated in triage.  48 year old male with past medical history of hypertension presenting to the emergency department for shortness of breath.  He reports that this has been ongoing for months.  He reports being out of his blood pressure medication (amlodipine ) for the last 2 and half weeks.  He reports that the shortness of breath is worse at night.  He was seen at urgent care this morning and referred here to the ED.  He is not currently taking blood thinners.  He reports right ankle swelling several days ago, however he thought this was secondary to arthritis. Denies any cardiac hx.  Chest x-ray earlier today shows evidence of cardiomegaly with pulmonary edema  Review of Systems  Positive: SOB, CP Negative: Fevers  Physical Exam  BP (!) 173/127 (BP Location: Left Arm)   Pulse 98   Temp 98.1 F (36.7 C)   Resp 18   Ht 5' 11 (1.803 m)   Wt 129.3 kg   SpO2 97%   BMI 39.75 kg/m  Gen:   Awake, mild resp distress with conversation Resp:  Tachypnea, prolonged expiratory phase MSK:   Moves extremities without difficulty, no leg swelling   Medical Decision Making  Medically screening exam initiated at 10:17 AM.  Appropriate orders placed.  Jeremy Hood was informed that the remainder of the evaluation will be completed by another provider, this initial triage assessment does not replace that evaluation, and the importance of remaining in the ED until their evaluation is complete.     Jeremy Moretto, DO 04/27/24 1019

## 2024-04-27 NOTE — ED Triage Notes (Signed)
 Pt. Statede, I was sent here form UC for chets pain and SOB for a couple of months and last night was terrible I was gasping for air. UC sent me here for further evaluation , said it looks like a blockage.

## 2024-04-27 NOTE — H&P (Signed)
 Date: 04/27/2024         Patient Name:  Jeremy Hood MRN: [id number]  DOB: Aug 06, 1976 Age / Sex: 48 y.o., male   PCP: Jeremy Clam, MD         Medical Service: Internal Medicine Teaching Service         Attending Physician: Dr. Francesco Elsie NOVAK, MD    First Contact: Dr. Benuel Pager: [phone]  Second Contact: Dr. Elnora Pager: [phone]                 Chief Concern: Can't breathe at night  History of Present Illness: 48 year old comes to the ED because of breathing trouble, especially at night time, ongoing for at least a few months. Breathing has gotten worse in that time. Last night he barely slept and was up most of the night with shortness of breath, which is why he came to the hospital. He's also having dyspnea on exertion at work.   Medical history notable for hypertension. Amlodipine  10 mg has been prescribed in the past but he's not currently taking this. 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  Family history notable for stroke in mom in her 5s.  He had TEE in 2018 that showed normal LV systolic function.  He lives in [place] with his wife. 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 He smokes 1 pack of cigarettes every few days. He smokes marijuana. He uses insufflated cocaine every couple of weeks. He isn't a regular alcohol  user.  In ED troponin was elevated and he was treated with aspirin  324 mg. BNP was ~1500 and chest x-ray showed some signs of pulmonary edema.  Review of Systems  Constitutional:  Negative for chills and fever.  Respiratory:  Positive for cough (non-productive) and shortness of breath. Negative for sputum production.   Cardiovascular:  Positive for orthopnea and leg swelling (intermittent, none now). Negative for chest pain.  Gastrointestinal:        No bloating  Genitourinary:  Positive for frequency (with some straining occasionally).   Psychiatric/Behavioral:  Positive for substance abuse.     Allergies: Allergies  Allergen Reactions   Zestril  [Lisinopril ] Swelling    Angioedema     Past Medical History: Per above  Medications: No current facility-administered medications on file prior to encounter.   Current Outpatient Medications on File Prior to Encounter  Medication Sig Dispense Refill   diphenhydramine-acetaminophen  (TYLENOL  PM) 25-500 MG TABS tablet Take 3 tablets by mouth at bedtime as needed (sleep, pain).     gabapentin (NEURONTIN) 300 MG capsule Take 300 mg by mouth daily as needed (pain, neuropathy).     meloxicam (MOBIC) 15 MG tablet Take 15 mg by mouth daily as needed for pain.     amLODipine  (NORVASC ) 10 MG tablet Take 1 tablet (10 mg total) by mouth daily. (Patient not taking: Reported on 04/27/2024) 30 tablet 2   [DISCONTINUED] lisinopril  (ZESTRIL ) 10 MG tablet Take 1 tablet (10 mg total) by mouth daily. 30 tablet 0     Surgical History: Reviewed, no pertinent updates.  Family History:  Reviewed, per above.  Social History:  Reviewed, per above.  Physical Exam: Blood pressure 129/70, pulse 83, temperature 98.1 F (36.7 C), temperature source Oral, resp. rate 20, height 5' 11 (1.803 m), weight 132.7 kg, SpO2 96%.  No distress Moist oral mucosa Heart rate and rhythm normal, strong R radial and R DP pulse,  no murmurs but heart sounds are faint, JV pulsations evident at level of earlobe Breathing comfortably, speaking full sentences, lungs with slight basilar crackles Skin warm and dry Alert and oriented, speech is normal, no facial asymmetry  EKG:  Sinus rhythm with right bundle branch and left fascicular block, new since 2018  Labs: Elevated but flat troponin Elevated BNP Mild hypokalemia  Images and other studies: Chest x-ray with cardiomegaly and hazy airspace opacities  Assessment & Plan:  Jeremy Hood is a 48 y.o. with history of hypertension presenting for dyspnea  with signs of volume overload, admitted for acute decompensated heart failure.  Principal Problem:   Acute heart failure (HCC) New diagnosis. Stable at present. No oxygen requirement. Well-perfused. Suspect hypertensive heart disease and HFpEF. Numerous risk factors for ischemic heart disease as well but no ACS at this time. Infiltrative cardiomyopathy is also possible. Check echo. Risk stratify for ischemic disease with lipid panel and A1c. Start IV loop diuretic. -furosemide  40 mg IV x 1 -input/output monitoring, daily weights -2 L fluid restriction -TTE -lipid panel and A1c -aim for K > 4 and Mg > 2 -admit to cardiac telemetry  Active Problems:   Essential hypertension May be driving heart failure syndrome. Restart outpatient amlodipine . -amlodipine  10 mg daily    Tobacco use Independent risk factor for ischemic heart disease. Ready to start attempts to quit.    Cocaine use disorder (HCC) Ongoing use despite poor health effects. Counseled about importance of complete abstinence from now on.     Paroxysmal nocturnal dyspnea Probably from heart failure but definitely high risk for sleep apnea as well. Consider a trial of CPAP if he's showing signs of this overnight. Checking ambulatory spO2.    Polyuria Especially at night time, with some straining. May be sign of prostatic hyperplasia, check BPH. Also screening for diabetes, has some mild hyperglycemia on admission today.  Level of care: cardiac telemetry Diet: regular, 2 L fluid restriction IVF: n/a VTE: rivaroxaban  (XARELTO ) tablet 10 mg Start: 04/27/24 1345 Code: full, but wouldn't want prolonged life support if he couldn't survive of ventilator or if he had catastrophic brain injury, for instance. Surrogate: wife, Jeremy Hood  Signed: Ozell Kung MD 04/27/2024, 4:13 PM Pager: [phone]

## 2024-04-27 NOTE — TOC CM/SW Note (Signed)
 Transition of Care [facility]) - Inpatient Brief Assessment   Patient Details  Name: TAHJI Kremlin MRN: [id number] Date of Birth: [birth year]  Transition of Care [facility]) CM/SW Contact:    Lauraine FORBES Saa, LCSW Phone Number: 04/27/2024, 4:28 PM   Clinical Narrative:  4:28 PM Per chart review, patient resides at home with spouse. Patient has a PCP but does not have insurance. CSW consulted financial counseling for further assistance. Patient does not have SNF history. Patient has HH and DME ([facility]/P[facility] Equipment) with [facility]. Patient's preferred pharmacy;s are [facility], [facility] [address], [place] [address] [address], and [facility] [address]. No TOC needs were identified at this time. TOC will continue to follow and be available to assist.  Transition of Care Asessment: Insurance and Status: Selfpay Patient has primary care physician: Yes Home environment has been reviewed: Private Residence Prior level of function:: N/A Prior/Current Home Services: No current home services Social Drivers of Health Review: SDOH reviewed no interventions necessary Readmission risk has been reviewed: Yes Transition of care needs: no transition of care needs at this time

## 2024-04-27 NOTE — Hospital Course (Addendum)
 Below is the hospital course for Jeremy Hood for admission dated 04/27/24:   In the ED, EKG showed Biatrial enlargement Right bundle branch block Left anterior fascicular block Bifascicular block. BNP was 1356.9. Troponin slightly elevated 137>127. A1C 5.9. Lipid panel showed chol 126, trig 196, LDL 56.   HFrEF, EF 30-35% Presented to ED because of breathing trouble for the last few months, especially at night time and when exerting himself at his job at [employer]. He was previously prescribed amlodipine  10 mg but has not been taking this. He smokes 1 pack of cigarettes every few days, smokes marijuana, and uses insufflated cocaine every couple of weeks. Upon admission, echo was ordered and furosemide  40 mg IV was started. Lasix  40mg  q6h was continued to diurese and resolve pulmonary edema on 07/10. Spironolactone  12.5mg  daily continued per GDMT. BiDil  TID also started on 07/10. On 07/11, losartan  12.5mg  given that patient has angioedema reaction to lisinopril  with monitoring in hospital. BiDil  was titrated to 1 tablet. Patient was continued on spironolactone  25mg  and lasix  40mg  BID. Patient continued on this regimen on 07/12 with addition of coreg  6.125 BID and lasix  being held today 2/2 Scr bump to 1.83. On 7/13, ECHO reviewed and showed HFrEF with EF 30-35%. The LV has moderately decreased function with gloal hypokinesis. LV cavity shows moderate dilation. RV systolic function is moderately reduced and moderately enlarged. Dilation of the LA and RA are appreciated. Moderate mitral valve regurgitation is appreciated. The aortic valve is tricuspid with mild calcification appreciated. On 7/13, patient continued on lasix  40 mg tablet, spironolactone  25. Patient dose of losartan  increased to 25 mg on 7/13.   Cardio consulted on 7/14 and ordered CMRI. Lasix  increased to 80 mg IV as well as losartan  increased to 50 mg with continued previous regimen. Cardio adjusted losartan  to irbesartan  300mg  every day  on 7/14 with addition of jardiance  10 mg every day and same previous regimen. Patient had last dose of IV lasix  on 7/16 with continued regimen of coreg  12/5mg , jardiance  10mg , irbesartan  300 mg. Patient continued on this regimen on 7/17 with transition to PO torsemide  40mg . Patient had improved diuresis throughout admission with ability to lie flat with decreased SOB on 7/16 and 7/17.   HTN Headache Patient BP elevated 173/127 in ED. Was previously prescribed amlodipine  but was not taking it regularly. Amlodipine  10 mg daily started on admission 07/09. Patient with persistent HTN through night 07/09 (152/104, 153/94) so BiDil  started on 07/10 and amlodipine  discontinued. Patient had persistent hypertension and headache on 07/11, so losartan  12.5mg  added, as well as magnesium  sulfate IVPB and compazine  for headache relief if 2/2 to HTN. Patient continued on this regimen on 07/12 with addition of coreg  6.125 BID. On 7/13, BiDil  switched to hydralazine  25mg  and imdur  20 mg to see if it resolves headaches. Losartan  increased to 25 mg on 7/13.  Hydralazine  25mg  and Imdur  30 mg discontinued due to continued headaches. Patient continued to have hypertension (173/105, 173/127, 141/96,146/106) night of 7/14. Cardio opted to look into secondary causes of hypertension, such as RAS. On 7/17, Patient continued to have hypertension (172/123,163/91,144/87) overnight. Renal duplex US  done is normal with no evidence of right or left renal artery stenosis. Discussed outpatient for workup of OSA.  Cocaine use disorder Patient uses insufflated cocaine every couple of weeks, which could be exacerbating HF. Counseled about importance of complete abstinence on admission 07/09.   Paroxysmal nocturnal dyspnea Patient has choking and gasping during sleep. On admission 07/09, was attributed  to either HF or possible OSA. A trial of CPAP was given on multiple nights (07/10, 07/12) during the course of admission, which was not  tolerated due to cumbersome nature of the CPAP mask. Discussed with patient importance of outpatient follow up for sleep study at admission.   Polyuria Patient noted nocturia on 07/10, which could be attributed to lasix , HFrEF with physiologically increased BNP, BPH, diabetes, or UTI. Workup revealed PSA WNL, UA WNL, and A1c not indicative of diabetes. Patient was monitored for improvement through admission until day of discharge for improvement with continued diuresis.   Anemia Patient CBC reveal Hgb around 10 on 07/10. TIBC normal, iron  sat 11, suggests iron  deficiency anemia. Patient was recommended outpatient oral/IV iron . Patient was given PO iron  during course of admission. She was given two doses of IV iron  on 07/16 and 07/17.    AKI with baseline creatinine ~1.2 Patient had initial creatinine 1.71 and resolved to 1.34 after diuresis. Initial AKI attributed to fluid overload from cardiorenal etiology. Patient had continuous monitoring of creatinine throughout admission with final creatinine prior to admission ***. Patient Scr up on 7/12 to 1.83 2/2 to lasix , so it was held on this day. AKI from 7/12 2/2 lasix  resolved on 7/13. Lasix  40mg  continued on 07/13. Patient Creatinine stable 7/14-7/16 with a bump on 7/17 to 1.57 2/2 lasix .   Primary care needs SDOH On 7/12, discussed with patient establishing care with a PCP at [facility]. He is currently uninsured and will need assistance with medication access. Patient plans to have appt with [facility] at discharge.

## 2024-04-27 NOTE — Plan of Care (Signed)
  Problem: Clinical Measurements: Goal: Will remain free from infection Outcome: Progressing   Problem: Activity: Goal: Risk for activity intolerance will decrease Outcome: Progressing   Problem: Coping: Goal: Level of anxiety will decrease Outcome: Progressing   Problem: Elimination: Goal: Will not experience complications related to urinary retention Outcome: Progressing   Problem: Pain Managment: Goal: General experience of comfort will improve and/or be controlled Outcome: Progressing

## 2024-04-27 NOTE — ED Provider Notes (Signed)
 [facility]    CSN: [id number] Arrival date & time: 04/27/24  0801      History   Chief Complaint Chief Complaint  Patient presents with   Shortness of Breath   Cough    HPI Jeremy Hood is a 48 y.o. male.   Patient presents with exertional shortness of breath and chest tightness that has been going on for a few months.  Patient states that he will wake up out of his sleep and be very short of breath and his chest becomes very tight.  Patient states that he also experiences this when walking long distances.  Patient states that he has had some cough and congestion periodically throughout the last few months as well.  Denies fever, body aches, chills, nausea, vomiting, diarrhea, abdominal pain.  Patient reports that he does have history of hypertension and is supposed to be taking amlodipine .  Patient states that he ran out of his amlodipine  2 to 3 weeks ago.  Patient denies having a primary care provider.  Patient stated the last time he had shortness of breath like this he ended up being admitted to the hospital with blood poisoning.  Upon chart review patient was admitted in 2018 for septic embolism with acute respiratory failure.  The history is provided by the patient and medical records.  Shortness of Breath Associated symptoms: cough   Cough Associated symptoms: shortness of breath     Past Medical History:  Diagnosis Date   Chest pain 11/2016   Hypertension     Patient Active Problem List   Diagnosis Date Noted   Anemia 12/17/2016   Obesity (BMI 35.0-39.9 without comorbidity) 12/10/2016   Acute respiratory failure with hypoxia (HCC)    Loosening of tooth    Cavitary lung disease    Dental abscess    Abnormal chest x-ray with multiple lung nodules    Liver lesion    Tobacco abuse    Cocaine abuse (HCC)    Lung mass 11/26/2016   Substance abuse, coacaine and marijuana  11/26/2016   Cough    Septic embolism (HCC)    Chest pain 11/23/2016    Costochondritis 11/23/2016   HTN (hypertension) 11/23/2016    Past Surgical History:  Procedure Laterality Date   ADENOIDECTOMY     TEE WITHOUT CARDIOVERSION N/A 11/26/2016   Procedure: TRANSESOPHAGEAL ECHOCARDIOGRAM (TEE);  Surgeon: Aleene JINNY Passe, MD;  Location: [facility] ENDOSCOPY;  Service: Cardiovascular;  Laterality: N/A;   VIDEO BRONCHOSCOPY Bilateral 12/02/2016   Procedure: VIDEO BRONCHOSCOPY WITHOUT FLUORO;  Surgeon: Toribio JINNY Domino, MD;  Location: [facility] ENDOSCOPY;  Service: Cardiopulmonary;  Laterality: Bilateral;       Home Medications    Prior to Admission medications   Medication Sig Start Date End Date Taking? Authorizing Provider  amLODipine  (NORVASC ) 10 MG tablet Take 1 tablet (10 mg total) by mouth daily. 09/25/23   Vonna Sharlet POUR, MD  benzonatate  (TESSALON ) 100 MG capsule Take 1 capsule (100 mg total) by mouth 3 (three) times daily as needed for cough. 09/25/23   Vonna Sharlet POUR, MD  doxycycline  (VIBRAMYCIN ) 100 MG capsule Take 1 capsule (100 mg total) by mouth 2 (two) times daily. 09/25/23   Banister, Pamela K, MD  hydrocortisone  (ANUSOL -HC) 2.5 % rectal cream Place 1 Application rectally 2 (two) times daily. 01/24/23   Griselda Norris, MD  lisinopril  (ZESTRIL ) 10 MG tablet Take 1 tablet (10 mg total) by mouth daily. 03/15/20 02/09/21  Hall-Potvin, Brittany, PA-C    Family History  Family History  Problem Relation Age of Onset   Hypertension Mother    Diabetes Mother     Social History Social History   Tobacco Use   Smoking status: Every Day    Current packs/day: 0.00    Average packs/day: 0.5 packs/day for 20.0 years (10.0 ttl pk-yrs)    Types: Cigarettes    Start date: 11/23/1996    Last attempt to quit: 11/23/2016    Years since quitting: 7.4   Smokeless tobacco: Never  Vaping Use   Vaping status: Never Used  Substance Use Topics   Alcohol  use: Not Currently    Comment: last drink in january 2017   Drug use: Yes    Types: Marijuana     Allergies    Lisinopril    Review of Systems Review of Systems  Respiratory:  Positive for cough and shortness of breath.    Per HPI  Physical Exam Triage Vital Signs ED Triage Vitals [04/27/24 0822]  Encounter Vitals Group     BP (!) 196/135     Girls Systolic BP Percentile      Girls Diastolic BP Percentile      Boys Systolic BP Percentile      Boys Diastolic BP Percentile      Pulse Rate 90     Resp 20     Temp 98.2 F (36.8 C)     Temp Source Oral     SpO2 96 %     Weight      Height      Head Circumference      Peak Flow      Pain Score      Pain Loc      Pain Education      Exclude from Growth Chart    No data found.  Updated Vital Signs BP (!) 196/135 (BP Location: Left Arm)   Pulse 90   Temp 98.2 F (36.8 C) (Oral)   Resp 20   SpO2 96%   Visual Acuity Right Eye Distance:   Left Eye Distance:   Bilateral Distance:    Right Eye Near:   Left Eye Near:    Bilateral Near:     Physical Exam Vitals and nursing note reviewed.  Constitutional:      General: He is awake. He is not in acute distress.    Appearance: Normal appearance. He is well-developed and well-groomed. He is not ill-appearing.  HENT:     Right Ear: Tympanic membrane, ear canal and external ear normal.     Left Ear: Tympanic membrane, ear canal and external ear normal.     Nose: Nose normal.     Mouth/Throat:     Mouth: Mucous membranes are moist.     Pharynx: Oropharynx is clear.  Cardiovascular:     Rate and Rhythm: Normal rate and regular rhythm.  Pulmonary:     Effort: Pulmonary effort is normal.     Breath sounds: Normal breath sounds.  Skin:    General: Skin is warm and dry.  Neurological:     General: No focal deficit present.     Mental Status: He is alert and oriented to person, place, and time. Mental status is at baseline.  Psychiatric:        Behavior: Behavior is cooperative.      UC Treatments / Results  Labs (all labs ordered are listed, but only abnormal results are  displayed) Labs Reviewed - No data to display  EKG   Radiology  No results found.  Procedures Procedures (including critical care time)  Medications Ordered in UC Medications - No data to display  Initial Impression / Assessment and Plan / UC Course  I have reviewed the triage vital signs and the nursing notes.  Pertinent labs & imaging results that were available during my care of the patient were reviewed by me and considered in my medical decision making (see chart for details).     Patient is overall well-appearing.  Blood pressure is elevated at 196/135.  All other vital signs are stable.  SpO2 96% on room air.  There are no significant findings upon exam.  EKG reveals normal sinus rhythm with bifascicular block and right bundle branch block which is different from previous EKG performed in 2018.  X-ray ordered to rule out underlying causes of shortness of breath.  X-ray appears worsened from previous one performed in December 2024 when patient was diagnosed with pneumonia at that time.  Question for pulmonary edema based on my interpretation.  Radiology report confirms presence of pulmonary edema.  Recommended patient be seen in the emergency department due to EKG changes, elevated blood pressure, and related symptoms.  Patient is agreeable to plan at this time.  Patient stable to arrive to the ER via POV. Final Clinical Impressions(s) / UC Diagnoses   Final diagnoses:  Shortness of breath  Uncontrolled hypertension  Abnormal EKG     Discharge Instructions      Please go to the emergency department for further evaluation.     ED Prescriptions   None    PDMP not reviewed this encounter.   Johnie Flaming A, NP 04/27/24 [phone]

## 2024-04-27 NOTE — ED Triage Notes (Signed)
 SOB,Cough and congestion x 1 month. Patient states he is having trouble sleeping at night.

## 2024-04-27 NOTE — ED Notes (Signed)
MD aware of troponin result.

## 2024-04-28 ENCOUNTER — Inpatient Hospital Stay (HOSPITAL_COMMUNITY): Payer: Self-pay

## 2024-04-28 DIAGNOSIS — I5021 Acute systolic (congestive) heart failure: Secondary | ICD-10-CM

## 2024-04-28 DIAGNOSIS — N1832 Chronic kidney disease, stage 3b: Secondary | ICD-10-CM

## 2024-04-28 DIAGNOSIS — N1831 Chronic kidney disease, stage 3a: Secondary | ICD-10-CM

## 2024-04-28 DIAGNOSIS — I509 Heart failure, unspecified: Secondary | ICD-10-CM

## 2024-04-28 DIAGNOSIS — D649 Anemia, unspecified: Secondary | ICD-10-CM

## 2024-04-28 LAB — CBC
HCT: 34.1 % — ABNORMAL LOW (ref 39.0–52.0)
Hemoglobin: 10.8 g/dL — ABNORMAL LOW (ref 13.0–17.0)
MCH: 27.6 pg (ref 26.0–34.0)
MCHC: 31.7 g/dL (ref 30.0–36.0)
MCV: 87 fL (ref 80.0–100.0)
Platelets: 266 K/uL (ref 150–400)
RBC: 3.92 MIL/uL — ABNORMAL LOW (ref 4.22–5.81)
RDW: 15.3 % (ref 11.5–15.5)
WBC: 7.3 K/uL (ref 4.0–10.5)
nRBC: 0 % (ref 0.0–0.2)

## 2024-04-28 LAB — URINALYSIS, ROUTINE W REFLEX MICROSCOPIC
Bacteria, UA: NONE SEEN
Bilirubin Urine: NEGATIVE
Glucose, UA: NEGATIVE mg/dL
Ketones, ur: NEGATIVE mg/dL
Leukocytes,Ua: NEGATIVE
Nitrite: NEGATIVE
Protein, ur: 100 mg/dL — AB
Specific Gravity, Urine: 1.016 (ref 1.005–1.030)
pH: 6 (ref 5.0–8.0)

## 2024-04-28 LAB — ECHOCARDIOGRAM COMPLETE
AR max vel: 2.58 cm2
AV Area VTI: 2.54 cm2
AV Area mean vel: 2.3 cm2
AV Mean grad: 3 mmHg
AV Peak grad: 4.2 mmHg
Ao pk vel: 1.03 m/s
Area-P 1/2: 4.44 cm2
Height: 71 in
S' Lateral: 5.2 cm
Weight: 4704 [oz_av]

## 2024-04-28 LAB — BASIC METABOLIC PANEL WITH GFR
Anion gap: 11 (ref 5–15)
Anion gap: 8 (ref 5–15)
BUN: 23 mg/dL — ABNORMAL HIGH (ref 6–20)
BUN: 24 mg/dL — ABNORMAL HIGH (ref 6–20)
CO2: 25 mmol/L (ref 22–32)
CO2: 26 mmol/L (ref 22–32)
Calcium: 8.7 mg/dL — ABNORMAL LOW (ref 8.9–10.3)
Calcium: 8.7 mg/dL — ABNORMAL LOW (ref 8.9–10.3)
Chloride: 105 mmol/L (ref 98–111)
Chloride: 107 mmol/L (ref 98–111)
Creatinine, Ser: 1.34 mg/dL — ABNORMAL HIGH (ref 0.61–1.24)
Creatinine, Ser: 1.45 mg/dL — ABNORMAL HIGH (ref 0.61–1.24)
GFR, Estimated: >60 mL/min (ref >60)
GFR, Estimated: 60 mL/min — ABNORMAL LOW (ref >60)
Glucose, Bld: 174 mg/dL — ABNORMAL HIGH (ref 70–99)
Glucose, Bld: 161 mg/dL — ABNORMAL HIGH (ref 70–99)
Potassium: 3.6 mmol/L (ref 3.5–5.1)
Potassium: 3.9 mmol/L (ref 3.5–5.1)
Sodium: 141 mmol/L (ref 135–145)
Sodium: 141 mmol/L (ref 135–145)

## 2024-04-28 LAB — IRON AND TIBC
Iron: 47 ug/dL (ref 45–182)
Saturation Ratios: 11 % — ABNORMAL LOW (ref 17.9–39.5)
TIBC: 448 ug/dL (ref 250–450)
UIBC: 401 ug/dL

## 2024-04-28 LAB — MAGNESIUM
Magnesium: 1.8 mg/dL (ref 1.7–2.4)
Magnesium: 1.7 mg/dL (ref 1.7–2.4)

## 2024-04-28 LAB — PSA: Prostatic Specific Antigen: 1.35 ng/mL (ref 0.00–4.00)

## 2024-04-28 LAB — HEMOGLOBIN A1C
Hgb A1c MFr Bld: 5.9 % — ABNORMAL HIGH (ref 4.8–5.6)
Mean Plasma Glucose: 123 mg/dL

## 2024-04-28 LAB — FERRITIN: Ferritin: 53 ng/mL (ref 24–336)

## 2024-04-28 MED ORDER — ACETAMINOPHEN 500 MG PO TABS
ORAL_TABLET | ORAL | Status: AC
  Administered 2024-04-28: 1000 mg via ORAL
  Filled 2024-04-28: qty 2

## 2024-04-28 MED ORDER — MAGNESIUM SULFATE 2 GM/50ML IV SOLN
2.0000 g | Freq: Once | INTRAVENOUS | Status: AC
  Administered 2024-04-28: 2 g via INTRAVENOUS
  Filled 2024-04-28: qty 50

## 2024-04-28 MED ORDER — ISOSORB DINITRATE-HYDRALAZINE 20-37.5 MG PO TABS
1.0000 | ORAL_TABLET | Freq: Three times a day (TID) | ORAL | Status: DC
  Administered 2024-04-28 – 2024-05-01 (×9): 1 via ORAL
  Filled 2024-04-28 (×10): qty 1

## 2024-04-28 MED ORDER — FUROSEMIDE 10 MG/ML IJ SOLN
40.0000 mg | Freq: Four times a day (QID) | INTRAMUSCULAR | Status: AC
  Administered 2024-04-28 (×2): 40 mg via INTRAVENOUS
  Filled 2024-04-28 (×2): qty 4

## 2024-04-28 MED ORDER — FUROSEMIDE 10 MG/ML IJ SOLN: 40.0000 mg | Freq: Once | INTRAMUSCULAR | Status: DC

## 2024-04-28 MED ORDER — ISOSORB DINITRATE-HYDRALAZINE 20-37.5 MG PO TABS
0.5000 | ORAL_TABLET | Freq: Every day | ORAL | Status: DC
  Administered 2024-04-28: 0.5 via ORAL
  Filled 2024-04-28: qty 0.5

## 2024-04-28 MED ORDER — FERROUS SULFATE 325 (65 FE) MG PO TABS
325.0000 mg | ORAL_TABLET | Freq: Every day | ORAL | Status: DC
  Administered 2024-04-29 – 2024-05-06 (×8): 325 mg via ORAL
  Filled 2024-04-28 (×8): qty 1

## 2024-04-28 MED ORDER — SENNOSIDES-DOCUSATE SODIUM 8.6-50 MG PO TABS
2.0000 | ORAL_TABLET | Freq: Every day | ORAL | Status: DC
  Filled 2024-04-28 (×2): qty 2

## 2024-04-28 MED ORDER — HYDRALAZINE HCL 25 MG PO TABS: 25.0000 mg | ORAL_TABLET | Freq: Once | ORAL | Status: DC

## 2024-04-28 MED ORDER — SPIRONOLACTONE 12.5 MG HALF TABLET
12.5000 mg | ORAL_TABLET | Freq: Every day | ORAL | Status: DC
  Administered 2024-04-28: 12.5 mg via ORAL
  Filled 2024-04-28: qty 1

## 2024-04-28 MED ORDER — PERFLUTREN LIPID MICROSPHERE
1.0000 mL | INTRAVENOUS | Status: AC | PRN
  Administered 2024-04-28: 2 mL via INTRAVENOUS

## 2024-04-28 MED ORDER — POTASSIUM CHLORIDE CRYS ER 20 MEQ PO TBCR
40.0000 meq | EXTENDED_RELEASE_TABLET | Freq: Once | ORAL | Status: AC
  Administered 2024-04-28: 40 meq via ORAL
  Filled 2024-04-28: qty 2

## 2024-04-28 MED ORDER — ACETAMINOPHEN 500 MG PO TABS
1000.0000 mg | ORAL_TABLET | Freq: Three times a day (TID) | ORAL | Status: DC | PRN
  Administered 2024-04-28 – 2024-04-30 (×6): 1000 mg via ORAL
  Filled 2024-04-28 (×6): qty 2

## 2024-04-28 NOTE — Progress Notes (Signed)
   04/27/24 2100  Assess: MEWS Score  Temp 98 F (36.7 C)  BP (!) 194/125  MAP (mmHg) 143  Pulse Rate 84  ECG Heart Rate 85  Resp (!) 34  Level of Consciousness Alert  SpO2 95 %  O2 Device Room Air  Assess: MEWS Score  MEWS Temp 0  MEWS Systolic 0  MEWS Pulse 0  MEWS RR 2  MEWS LOC 0  MEWS Score 2  MEWS Score Color Yellow  Assess: if the MEWS score is Yellow or Red  Were vital signs accurate and taken at a resting state? Yes  Does the patient meet 2 or more of the SIRS criteria? No  MEWS guidelines implemented  Yes, yellow  Treat  MEWS Interventions Considered administering scheduled or prn medications/treatments as ordered  Take Vital Signs  Increase Vital Sign Frequency  Yellow: Q2hr x1, continue Q4hrs until patient remains green for 12hrs  Escalate  MEWS: Escalate Yellow: Discuss with charge nurse and consider notifying provider and/or RRT  Notify: Charge Nurse/RN  Name of Charge Nurse/RN Notified Lonell, RN  Provider Notification  Provider Name/Title Isobel, DO  Date Provider Notified 04/27/24  Time Provider Notified 2108  Method of Notification Page  Notification Reason Change in status  Provider response See new orders  Date of Provider Response 04/27/24  Time of Provider Response 2110  Assess: SIRS CRITERIA  SIRS Temperature  0  SIRS Respirations  1  SIRS Pulse 0  SIRS WBC 0  SIRS Score Sum  1

## 2024-04-28 NOTE — Plan of Care (Signed)
  Problem: Education: Goal: Knowledge of General Education information will improve Description: Including pain rating scale, medication(s)/side effects and non-pharmacologic comfort measures Outcome: Progressing   Problem: Clinical Measurements: Goal: Will remain free from infection Outcome: Progressing   Problem: Activity: Goal: Risk for activity intolerance will decrease Outcome: Progressing   Problem: Coping: Goal: Level of anxiety will decrease Outcome: Progressing   Problem: Elimination: Goal: Will not experience complications related to urinary retention Outcome: Progressing

## 2024-04-28 NOTE — Progress Notes (Addendum)
 I have personally seen and examined the patient and agree with the following evaluation and plan of care for this patient.  -Sallyanne Primas, DO [facility]    HD#1 SUBJECTIVE:  Patient Summary: Jeremy Hood is a 48 y.o. with a pertinent PMH of HTN, CKD, multifocal cavitary lung disease, new diagnosed CHF, who presented with breathing difficulty especially at night and admitted for CHF. He has noticed breathing trouble continuing for the last few months. Specifically, the notices worsening when he sleeps, which he describes as a choking sensation, and also shortness of breath when he exerts himself at work. He has a family history of stroke in his mother at 64. He was previously prescribed amlodipine  10 mg but has not been currently taking this. EKG in ED revealed normal sinus rhythm, bi atrial enlargement, RBBB, left anterior fascicular block, bifascicular block.   Overnight Events: No overnight events   Interim History: Patient has felt some improvement in shortness of breath symptoms. His wife is with him today and recorded him while he was sleeping. Recording reveals intermittent gasping and snoring. Patient had elevated BP in AM with systolic 190s.   OBJECTIVE:  Vital Signs: Vitals:   04/27/24 2236 04/28/24 0318 04/28/24 0500 04/28/24 0732  BP: (!) 152/104 (!) 153/94  (!) 194/122  Pulse: 76 71 76 86  Resp: 20 20 (!) 23 (!) 26  Temp: 98 F (36.7 C) 97.6 F (36.4 C)  97.8 F (36.6 C)  TempSrc: Oral Oral  Oral  SpO2: 100% 96% 93% 95%  Weight:   133.4 kg   Height:       Supplemental O2: Room Air SpO2: 95 % O2 Flow Rate (L/min): 2 L/min  Filed Weights   04/27/24 0921 04/27/24 1523 04/28/24 0500  Weight: 129.3 kg 132.7 kg 133.4 kg     Intake/Output Summary (Last 24 hours) at 04/28/2024 1127 Last data filed at 04/28/2024 0900 Gross per 24 hour  Intake 1920 ml  Output 2240 ml  Net -320 ml   Net IO Since Admission: -320 mL [04/28/24  1127]  Physical Exam: Physical Exam Const:: Awake, alert in NAD HENT: Normocephalic, atraumatic Card: RRR, No MRG, 2+ pitting edema on LE's bilaterally  Resp: Bilateral crackles appreciated, no increased work of breathing    Patient Lines/Drains/Airways Status     Active Line/Drains/Airways     Name Placement date Placement time Site Days   Peripheral IV 04/27/24 20 G Anterior;Right Forearm 04/27/24  1148  Forearm  1   Wound / Incision (Open or Dehisced) 12/25/15 Coccyx I and d abscess 12/25/15  1538  Coccyx  3047            Pertinent labs and imaging:  Troponin: 137>127 BNP: 1356.9 A1C: 5.9 Lipid: chol 126 trig 196 LDL 56    Latest Ref Rng & Units 04/28/2024    3:59 AM 04/27/2024    9:29 AM 03/25/2023    5:51 PM  CBC  WBC 4.0 - 10.5 K/uL 7.3  7.7  9.9   Hemoglobin 13.0 - 17.0 g/dL 89.1  88.9  88.5   Hematocrit 39.0 - 52.0 % 34.1  34.7  35.5   Platelets 150 - 400 K/uL 266  279  276        Latest Ref Rng & Units 04/28/2024    3:59 AM 04/27/2024    4:22 PM 04/27/2024    9:29 AM  CMP  Glucose 70 - 99 mg/dL 825  891  865  BUN 6 - 20 mg/dL 23  22  23    Creatinine 0.61 - 1.24 mg/dL 8.65  8.28  8.54   Sodium 135 - 145 mmol/L 141  142  143   Potassium 3.5 - 5.1 mmol/L 3.6  3.3  3.4   Chloride 98 - 111 mmol/L 105  105  106   CO2 22 - 32 mmol/L 25  23  21    Calcium 8.9 - 10.3 mg/dL 8.7  8.9  8.9     No results found.  ASSESSMENT/PLAN:  Assessment: Principal Problem:   Acute heart failure (HCC) Active Problems:   Essential hypertension   Tobacco use   Cocaine use disorder (HCC)   Paroxysmal nocturnal dyspnea   Polyuria   Plan: #HFrEF newly diagnosed with acute exacerbation ECHO in 2018 with normal EF and G1DD.  Cardiology consulted at that time for chest pain and felt to be non ischemic despite mild abnormality on nuclear stress test.   Based on current results most likely 2/2 uncontrolled HTN and ongoing cocaine use. Bedside US  done by team revealed reduced  ejection fraction. IVC difficult to visualize but B lines on lung exam, formal echo pending.   -Patient seems to still have pulmonary edema and would be beneficial to continue to get fluid off. Give lasix  40 mg q6h had good output yesterday but drank through it so will tighten fluid restriction -add spironolactone  12.5 mg QD -start Bidil  TID and titrate -take daily standing weights  #Essential hypertension Patient has persistent hypertension.  -Stop amlodipine , GDMT as above  #Paroxysmal nocturnal dyspnea Patient has choking, gasping, and snoring during sleep and must sleep on multiple pillows. Might be a component of OSA.  -monitor for improvement post diuresis -Trial CPAP tonight  #Polyuria Patient notes nocturia. Ddx: Heart failure, BPH, Diabetes, and UTI -PSA normal  -UA not concerning for UTI etiology.   -A1c 5.9 (prediabetes)  #Anemia Patient CBC revealed hgb around 10. TIBC normal, iron  sat 11, suggests iron  deficiency anemia. Recommending outpatient oral/IV iron .  -will give PO iron  during admission.   #AKI Creatinine improving from initial increase yesterday at 1.71. today 1.34 after lasix  administration. AKI likely due to fluid overload from cardiorenal etiology.  -will continue lasix  and continue to monitor  Best Practice: Diet: Regular diet IVF: Fluids: none, Rate: None VTE: rivaroxaban  (XARELTO ) tablet 10 mg Start: 04/27/24 1345 Code: Full  Disposition planning: Therapy Recs: Pending, DME: none Family Contact: Verneita Claude, to be notified. DISPO: Anticipated discharge pending to Home pending improving clinical status.  Signature:  Brad Fayne [facility]  11:27 AM, 04/28/2024

## 2024-04-28 NOTE — TOC Initial Note (Addendum)
 Transition of Care [facility]) - Initial/Assessment Note    Patient Details  Name: Jeremy Hood MRN: [id number] Date of Birth: 1976-03-15  [facility] CM/SW Contact:    Roxie KANDICE Stain, RN Phone Number: 04/28/2024, 2:27 PM  Clinical Narrative:                  Spoke to patient and wife at bedside regarding transition needs. Patient is currently on 3L oxygen but doesn't have oxygen at baseline. Patient lives with wife and doesn't have any DME. Patient states he makes too much money to qualify for medicaid.  Patient is agreeable for TOC to make PCP apt. Sent message to CMA to make new PCP apt.  TOC will do MATCH letter @ discharge.  Patient has transportation.  TOC will continue to follow for needs.  Expected Discharge Plan: Home/Self Care Barriers to Discharge: Continued Medical Work up   Patient Goals and CMS Choice Patient states their goals for this hospitalization and ongoing recovery are:: return hom          Expected Discharge Plan and Services In-house Referral: Artist, PCP / Health Connect Discharge Planning Services: CM Consult, MATCH Program, Follow-up appt scheduled   Living arrangements for the past 2 months: Single Family Home                                      Prior Living Arrangements/Services Living arrangements for the past 2 months: Single Family Home Lives with:: Spouse Patient language and need for interpreter reviewed:: Yes Do you feel safe going back to the place where you live?: Yes      Need for Family Participation in Patient Care: Yes (Comment) Care giver support system in place?: Yes (comment)   Criminal Activity/Legal Involvement Pertinent to Current Situation/Hospitalization: No - Comment as needed  Activities of Daily Living   ADL Screening (condition at time of admission) Independently performs ADLs?: No Does the patient have a NEW difficulty with bathing/dressing/toileting/self-feeding that is  expected to last >3 days?: No Does the patient have a NEW difficulty with getting in/out of bed, walking, or climbing stairs that is expected to last >3 days?: No Does the patient have a NEW difficulty with communication that is expected to last >3 days?: No Is the patient deaf or have difficulty hearing?: No Does the patient have difficulty seeing, even when wearing glasses/contacts?: No Does the patient have difficulty concentrating, remembering, or making decisions?: No  Permission Sought/Granted                  Emotional Assessment Appearance:: Appears stated age Attitude/Demeanor/Rapport: Gracious Affect (typically observed): Accepting Orientation: : Oriented to Self, Oriented to Place, Oriented to  Time, Oriented to Situation Alcohol  / Substance Use: Not Applicable Psych Involvement: No (comment)  Admission diagnosis:  Acute heart failure (HCC) [I50.9] Patient Active Problem List   Diagnosis Date Noted   Congestive heart failure (HCC) 04/28/2024   Acute heart failure (HCC) 04/27/2024   Paroxysmal nocturnal dyspnea 04/27/2024   Polyuria 04/27/2024   Anemia 12/17/2016   Obesity (BMI 35.0-39.9 without comorbidity) 12/10/2016   Acute respiratory failure with hypoxia (HCC)    Loosening of tooth    Cavitary lung disease    Dental abscess    Abnormal chest x-ray with multiple lung nodules    Liver lesion    Tobacco use    Cocaine use disorder (  HCC)    Lung mass 11/26/2016   Substance abuse, coacaine and marijuana  11/26/2016   Cough    Septic embolism (HCC)    Chest pain 11/23/2016   Costochondritis 11/23/2016   Essential hypertension 11/23/2016   PCP:  Patient, No Pcp Per Pharmacy:   [id number] [address] [place], [place] - [address] AT [facility] OF [facility] & [address] [place] [address] Phone: [phone] Fax: [phone]  [facility] [address] [place] - [address] [place] [address] Phone: [phone] Fax: [phone]  [facility] - [facility] [address], [address] [place] [address] Phone: [phone] Fax: [phone]  [id number] [place], [place] - [address] AT [facility] OF [address] & [address] [place] [address] Phone: [phone] Fax: [phone]     Social Drivers of Health (SDOH) Social History: SDOH Screenings   Food Insecurity: No Food Insecurity (04/27/2024)  Housing: Low Risk  (04/27/2024)  Transportation Needs: No Transportation Needs (04/27/2024)  Utilities: Not At Risk (04/27/2024)  Social Connections: Moderately Isolated (04/27/2024)  Tobacco Use: High Risk (04/27/2024)   SDOH Interventions:     Readmission Risk Interventions     No data to display

## 2024-04-29 ENCOUNTER — Other Ambulatory Visit (HOSPITAL_COMMUNITY): Payer: Self-pay

## 2024-04-29 ENCOUNTER — Ambulatory Visit (HOSPITAL_COMMUNITY): Payer: Self-pay

## 2024-04-29 ENCOUNTER — Encounter (HOSPITAL_COMMUNITY): Payer: Self-pay | Admitting: Internal Medicine

## 2024-04-29 DIAGNOSIS — I5020 Unspecified systolic (congestive) heart failure: Secondary | ICD-10-CM

## 2024-04-29 LAB — BASIC METABOLIC PANEL WITH GFR
Anion gap: 10 (ref 5–15)
Anion gap: 10 (ref 5–15)
BUN: 24 mg/dL — ABNORMAL HIGH (ref 6–20)
BUN: 21 mg/dL — ABNORMAL HIGH (ref 6–20)
CO2: 24 mmol/L (ref 22–32)
CO2: 26 mmol/L (ref 22–32)
Calcium: 8.8 mg/dL — ABNORMAL LOW (ref 8.9–10.3)
Calcium: 8.8 mg/dL — ABNORMAL LOW (ref 8.9–10.3)
Chloride: 107 mmol/L (ref 98–111)
Chloride: 104 mmol/L (ref 98–111)
Creatinine, Ser: 1.31 mg/dL — ABNORMAL HIGH (ref 0.61–1.24)
Creatinine, Ser: 1.45 mg/dL — ABNORMAL HIGH (ref 0.61–1.24)
GFR, Estimated: >60 mL/min (ref >60)
GFR, Estimated: 60 mL/min — ABNORMAL LOW (ref >60)
Glucose, Bld: 104 mg/dL — ABNORMAL HIGH (ref 70–99)
Glucose, Bld: 127 mg/dL — ABNORMAL HIGH (ref 70–99)
Potassium: 3.9 mmol/L (ref 3.5–5.1)
Potassium: 3.3 mmol/L — ABNORMAL LOW (ref 3.5–5.1)
Sodium: 141 mmol/L (ref 135–145)
Sodium: 140 mmol/L (ref 135–145)

## 2024-04-29 LAB — CBC
HCT: 33.9 % — ABNORMAL LOW (ref 39.0–52.0)
Hemoglobin: 10.8 g/dL — ABNORMAL LOW (ref 13.0–17.0)
MCH: 27.8 pg (ref 26.0–34.0)
MCHC: 31.9 g/dL (ref 30.0–36.0)
MCV: 87.4 fL (ref 80.0–100.0)
Platelets: 286 K/uL (ref 150–400)
RBC: 3.88 MIL/uL — ABNORMAL LOW (ref 4.22–5.81)
RDW: 15.3 % (ref 11.5–15.5)
WBC: 10.5 K/uL (ref 4.0–10.5)
nRBC: 0 % (ref 0.0–0.2)

## 2024-04-29 LAB — MAGNESIUM: Magnesium: 2.2 mg/dL (ref 1.7–2.4)

## 2024-04-29 MED ORDER — SPIRONOLACTONE 25 MG PO TABS
25.0000 mg | ORAL_TABLET | Freq: Every day | ORAL | Status: DC
  Administered 2024-04-29 – 2024-05-06 (×8): 25 mg via ORAL
  Filled 2024-04-29 (×8): qty 1

## 2024-04-29 MED ORDER — PROCHLORPERAZINE MALEATE 10 MG PO TABS
10.0000 mg | ORAL_TABLET | Freq: Four times a day (QID) | ORAL | Status: DC | PRN
  Administered 2024-04-29 – 2024-05-02 (×7): 10 mg via ORAL
  Filled 2024-04-29 (×12): qty 1

## 2024-04-29 MED ORDER — LOSARTAN POTASSIUM 25 MG PO TABS
12.5000 mg | ORAL_TABLET | Freq: Every day | ORAL | Status: DC
  Administered 2024-04-29 – 2024-04-30 (×2): 12.5 mg via ORAL
  Filled 2024-04-29 (×2): qty 1

## 2024-04-29 MED ORDER — MAGNESIUM SULFATE IN D5W 1-5 GM/100ML-% IV SOLN
1.0000 g | Freq: Once | INTRAVENOUS | Status: AC
  Administered 2024-04-29: 1 g via INTRAVENOUS
  Filled 2024-04-29: qty 100

## 2024-04-29 MED ORDER — FUROSEMIDE 10 MG/ML IJ SOLN
40.0000 mg | Freq: Once | INTRAMUSCULAR | Status: AC
  Administered 2024-04-29: 40 mg via INTRAVENOUS
  Filled 2024-04-29: qty 4

## 2024-04-29 MED ORDER — DIPHENHYDRAMINE HCL 50 MG/ML IJ SOLN
25.0000 mg | Freq: Once | INTRAMUSCULAR | Status: AC
  Administered 2024-04-29: 25 mg via INTRAVENOUS
  Filled 2024-04-29: qty 1

## 2024-04-29 MED ORDER — PROCHLORPERAZINE MALEATE 10 MG PO TABS
10.0000 mg | ORAL_TABLET | Freq: Once | ORAL | Status: AC
  Administered 2024-04-29: 10 mg via ORAL
  Filled 2024-04-29: qty 1

## 2024-04-29 NOTE — Progress Notes (Addendum)
 I have personally seen and examined the patient and agree with the following evaluation and plan of care for this patient.  -Sallyanne Primas, DO [facility] Internal Medicine Residency   HD#2 SUBJECTIVE:  Patient Summary: Jeremy Hood is a 48 y.o. with a pertinent PMH of HTN, CKD, multifocal cavitary lung disease, new diagnosed CHF, who presented with breathing difficulty especially at night and admitted for CHF. He has noticed breathing trouble continuing for the last few months. Specifically, the notices worsening when he sleeps, which he describes as a choking sensation, and also shortness of breath when he exerts himself at work. He has a family history of stroke in his mother at 60. He was previously prescribed amlodipine  10 mg but has not been currently taking this.   Overnight Events: Overnight, patient had elevated BP 172/118 headache 5/10 in pain and no vision changes. He was given BiDil  and tylenol . BP decreased to 156/96. Patient was unable to tolerate CPAP last night due to discomfort of mask.   Interim History: Patient continues to have SOB and headache which is worsening in pain (6/10). He has felt less dyspnea lying down to sleep than when he did at admission.    OBJECTIVE:  Vital Signs: Vitals:   04/29/24 0545 04/29/24 0729 04/29/24 0730 04/29/24 0731  BP: (!) 156/96 (!) 164/101    Pulse: 88 98 93 93  Resp: (!) 21 20 (!) 27 20  Temp:  98.7 F (37.1 C)    TempSrc:  Oral    SpO2: 97% 95% 90% 95%  Weight:      Height:       Supplemental O2: Room Air SpO2: 95 % O2 Flow Rate (L/min): 3 L/min  Filed Weights   04/27/24 1523 04/28/24 0500 04/29/24 0500  Weight: 132.7 kg 133.4 kg 133.7 kg     Intake/Output Summary (Last 24 hours) at 04/29/2024 1105 Last data filed at 04/29/2024 0731 Gross per 24 hour  Intake 1160 ml  Output 2725 ml  Net -1565 ml   Net IO Since Admission: -1,765 mL [04/29/24 1105]  Physical Exam: Physical Exam Const:: Awake, alert in  NAD HENT: Normocephalic, atraumatic Card: No JVD appreciated, 1+ pitting edema on LE's bilaterally  Resp: No increased work of breathing. Lung sounds limited by body habitus    Patient Lines/Drains/Airways Status     Active Line/Drains/Airways     Name Placement date Placement time Site Days   Peripheral IV 04/27/24 20 G Anterior;Right Forearm 04/27/24  1148  Forearm  1   Wound / Incision (Open or Dehisced) 12/25/15 Coccyx I and d abscess 12/25/15  1538  Coccyx  3047            Pertinent labs and imaging:  Troponin: 137>127 BNP: 1356.9 A1C: 5.9 Lipid: chol 126 trig 196 LDL 56    Latest Ref Rng & Units 04/29/2024    3:21 AM 04/28/2024    3:59 AM 04/27/2024    9:29 AM  CBC  WBC 4.0 - 10.5 K/uL 10.5  7.3  7.7   Hemoglobin 13.0 - 17.0 g/dL 89.1  89.1  88.9   Hematocrit 39.0 - 52.0 % 33.9  34.1  34.7   Platelets 150 - 400 K/uL 286  266  279        Latest Ref Rng & Units 04/29/2024    3:21 AM 04/28/2024    6:14 PM 04/28/2024    3:59 AM  CMP  Glucose 70 - 99 mg/dL 895  838  174   BUN 6 - 20 mg/dL 24  24  23    Creatinine 0.61 - 1.24 mg/dL 8.68  8.54  8.65   Sodium 135 - 145 mmol/L 141  141  141   Potassium 3.5 - 5.1 mmol/L 3.9  3.9  3.6   Chloride 98 - 111 mmol/L 107  107  105   CO2 22 - 32 mmol/L 24  26  25    Calcium 8.9 - 10.3 mg/dL 8.8  8.7  8.7     No results found.  ASSESSMENT/PLAN:  Assessment: Principal Problem:   Acute heart failure (HCC) Active Problems:   Essential hypertension   Tobacco use   Cocaine use disorder (HCC)   Paroxysmal nocturnal dyspnea   Polyuria   Congestive heart failure (HCC)   Stage 3b chronic kidney disease (HCC)   Stage 3a chronic kidney disease (HCC)   Plan: #HFrEF newly diagnosed with acute exacerbation Patient seems to still have pulmonary edema and would be beneficial to continue to get fluid off. Beside US  done by team 7/11, A lines appreciated on beside US .   -give lasix  40 mg AM and again at 1700 -increase spironolactone   25 mg QD -start Bidil  TID and titrate to1 tablet -add losartan  12.5 mg every day (also given with benadryl  injection 25 mg due to PMH of angioedema after lisinopril ) -take daily standing weights -AM BMP -heart healthy diet  #Headache Essential hypertension Patient has persistent hypertension and headache that is worsening. Amlodipine  d/c yesterday -added losartan  12.5 mg every day (also given with benadryl  injection 25 mg due to PMH of angioedema after lisinopril ) -added magnesium  sulfate IVPB 1g 100 mL -added compazine  10 mg  #Paroxysmal nocturnal dyspnea Patient has choking, gasping, and snoring during sleep and must sleep on multiple pillows. Might be a component of OSA. Patient had CPAP use last night but was unable to tolerate mask covering the face.  -monitor for improvement post diuresis -Discussed outpatient referral for sleep study and intranasal CPAP for greater comfort if necessary  #Polyuria Patient noted nocturia at admission. Ddx: Heart failure, BPH, Diabetes, and UTI. UA positive for protein, no bacteria. PSA WNL. Polyuria at this stage could be attributed to lasix  or BNP elevation or a combination. -monitor for improvement of polyuria after diuresis  #Anemia Patient CBC revealed hgb around 10. TIBC normal, iron  sat 11, suggests iron  deficiency anemia. Recommending outpatient oral/IV iron .  -continue PO iron  during admission.  -AM CBC  #AKI Creatinine improving  at 1.3 today. AKI likely due to fluid overload from cardiorenal etiology.  -will continue lasix  and continue to monitor  Best Practice: Diet: Regular diet IVF: Fluids: none, Rate: None VTE: rivaroxaban  (XARELTO ) tablet 10 mg Start: 04/27/24 1345 Code: Full  Disposition planning: Therapy Recs: Pending, DME: none Family Contact: Verneita Claude, to be notified. DISPO: Anticipated discharge pending to Home pending improving clinical status.  Signature:  Brad Prey MS3  11:05 AM, 04/29/2024

## 2024-04-29 NOTE — Progress Notes (Signed)
  MATCH MEDICATION ASSISTANCE CARD Pharmacies please call [phone] for claim processing assistance.  Rx BIN: [id number] Rx Group: [id number] Rx PCN: [id number] Relationship Code: 1 Person Code: 01  Patient ID (MRN): [id number]    Patient Name: Jeremy Hood   Patient DOB 10/10/1976   Discharge Date:04/29/2024  Expiration Date:05/06/2024 (must be filled within 7 days of discharge)   Dear [name] have been approved to have the prescriptions written by your discharging physician filled through our [name] (Medication Assistance Through [name]) program. This program allows for a one-time (no refills) 34-day supply of selected medications for a low copay amount.  The copay is $3.00 per prescription. For instance, if you have one prescription, you will pay $3.00; for two prescriptions, you pay $6.00; for three prescriptions, you pay $9.00; and so on. Only certain pharmacies are participating in this program with [name]. You will need to select one of the pharmacies from the attached lists and take your prescriptions, this letter, and your photo ID to one of the participating pharmacies.  We are excited that you are able to use the [name] to get your medications. These prescriptions must be filled within 7 days of hospital discharge or they will no longer be valid for the [name] program. Should you have any problems with your prescriptions please contact your case management team member at [phone] for [place] or [phone] for [facility].  Thank you, [facility]    [facility] Program Pharmacies [place]. [facility], [facility], [facility]  [facility] [address] [place], [place] [address] [place], [place] [address] [place], [employer] [address], [place] Other [facility] [address] [place], [place] [address]  [facility] [id number], [place]

## 2024-04-29 NOTE — Progress Notes (Signed)
 Pt's BP 172/118 MAP 133, complaining of 5/10 headache, no vision changes. Attempted to notify Amilibia, DO. Awaiting further orders.  Lonell LITTIE Lyme, RN

## 2024-04-29 NOTE — Progress Notes (Addendum)
 Heart Failure Stewardship Pharmacist Progress Note   PCP: Patient, No Pcp Per PCP-Cardiologist: None    HPI:  48 yo M with PMH of HTN, tobacco use, cocaine use, and marijuana use.   Presented to urgent care on 7/9 with shortness of breath, orthopnea, PND, cough, and congestion x 1 month. Was not taking his amlodipine  for 2-3 weeks. Does not have PCP. Referred to the ED for evaluation. Trop elevated 137>127 and BNP 1356. CXR with cardiomegaly and pulmonary edema. ECHO 7/10 showed LVEF 30-35% (55-60% in 2018), global hypokinesis, moderate concentric LVH, G2DD, RV moderately reduced, moderate MR.   Met with patient and his wife at bedside. LE edema is improved but still remains short of breath on exertion and while up sitting and talking with me. Cannot lay flat yet. When he was on lisinopril  in the past, he had lip and facial swelling that required hospitalization and monitoring. He was transitioned to amlodipine  at this time. Reviewed GDMT and HF disease state management. He is reporting headaches with BiDil . He drinks ~2 gallons of fluid per day. He does not have insurance and was told he makes too much for Medicaid.   Current HF Medications: MRA: spironolactone  12.5 mg daily Other: BiDil  1 tab TID  Prior to admission HF Medications: None  Pertinent Lab Values: Serum creatinine 1.45>1.31, BUN 24, Potassium 3.9, Sodium 141, BNP 1356.9, Magnesium  2.2, A1c 5.9   Vital Signs: Weight: 294 lbs (admission weight: 292 lbs) Blood pressure: 160-170/100s  Heart rate: 80-110s  I/O: net -1.9L yesterday; net -1.8L since admission  Medication Assistance / Insurance Benefits Check: Does the patient have prescription insurance?  No  Does the patient qualify for medication assistance through manufacturers or grants?   Yes Eligible grants and/or patient assistance programs: [employer]iance  Medication assistance applications in progress: Jardiance   Medication assistance applications approved:  none Approved medication assistance renewals will be completed by: pending  Outpatient Pharmacy:  Prior to admission outpatient pharmacy: [facility] Is the patient willing to use [facility] TOC pharmacy at discharge? Yes Is the patient willing to transition their outpatient pharmacy to utilize a [facility] outpatient pharmacy?   Yes - pending cash prices     Assessment: 1. 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 NYHA class III symptoms. - Now off IV lasix  - with ongoing shortness of breath, would continue IV lasix  today. Strict I/Os and daily weights. Keep K>4 and Mg>2. - Consider adding carvedilol  6.25 mg BID once euvolemic - preferred BB in the setting of cocaine use - Noted allergy to lisinopril  (angioedema) - see above regarding his reported reaction. Could consider adding losartan  and monitoring closely.  - Consider increasing to spironolactone  25 mg daily - Consider adding Jardiance  10 mg daily - Continue BiDil  1 tab TID - will need to separate to hydralazine  + Imdur  on discharge from cost perspective. If headaches continue, may need to stop from a tolerability standpoint.    Plan: 1) Medication changes recommended at this time: - Restart IV lasix  - Increase spironolactone  to 25 mg daily - Add Jardiance  10 mg daily - May need to stop BiDil  if he cannot tolerate this   2) Patient assistance: - Uninsured - generic BiDil  cost ~$120 per month - hydralazine /imdur  ~$30 per month - Jardiance  patient assistance application signed by patient in case this is started before discharge - Agreeable to using [facility] mail order for medications pending cash/discount pricing for generic medications  3)  Education  -  Patient has been educated on current HF medications and potential additions to HF medication regimen - Patient verbalizes understanding that over the next few months, these medication doses may change and more medications may be added to optimize  HF regimen - Patient has been educated on basic disease state pathophysiology and goals of therapy   Duwaine Plant, PharmD, BCPS Heart Failure Stewardship Pharmacist Phone [phone]

## 2024-04-29 NOTE — Progress Notes (Signed)
 Heart Failure Nurse Navigator Progress Note  PCP: Patient, No Pcp Per PCP-Cardiologist: None Admission Diagnosis: Shortness of breath, Uncontrolled hypertension, Abnormal EKG.  Admitted from: Home   Presentation:   Jeremy Hood presented with shortness of breath, cough and congestion, reports trouble sleeping at night, wakes up very short of breath. Reports to running out of his Amlodipine  2- 3 weeks ago. BP 196/135, HR 90, BNP 1,536, BMI 41.12, Troponin 137, CXR with cardiomegaly with pulmonary edema.   Patient and his wife were educated on the sign and symptoms of heart failure, daily weights, when to call his doctor or go to the ED. Diet/ fluid restrictions, patient reports to drinking over 2 gallons of fluid daily, he has cut back on soda, and doesn't drink Red Bull anymore. Continued education on taking all his medications as prescribed, he is interested in our [facility] program at discharge. He had concerns about his CPAP in hospital, and how he couldn't tolerate it. Respiratory called and they came to bedside and adjusted settings for patient and his compliance. A HF TOC appointment was scheduled for 05/05/2024 @ 2 pm. Unit CSW was working on getting patient a PCP and helping with applying for Insurance.   ECHO/ LVEF: 30-35%  Clinical Course:  Past Medical History:  Diagnosis Date   Chest pain 11/2016   Hypertension      Social History   Socioeconomic History   Marital status: Married    Spouse name: Not on file   Number of children: Not on file   Years of education: Not on file   Highest education level: Not on file  Occupational History   Not on file  Tobacco Use   Smoking status: Every Day    Current packs/day: 0.00    Average packs/day: 0.5 packs/day for 20.0 years (10.0 ttl pk-yrs)    Types: Cigarettes    Start date: 11/23/1996    Last attempt to quit: 11/23/2016    Years since quitting: 7.4   Smokeless tobacco: Never  Vaping Use   Vaping status: Never Used   Substance and Sexual Activity   Alcohol  use: Not Currently    Comment: last drink in january 2017   Drug use: Yes    Types: Marijuana   Sexual activity: Not Currently  Other Topics Concern   Not on file  Social History Narrative   Not on file   Social Drivers of Health   Financial Resource Strain: Not on file  Food Insecurity: No Food Insecurity (04/27/2024)   Hunger Vital Sign    Worried About Running Out of Food in the Last Year: Never true    Ran Out of Food in the Last Year: Never true  Transportation Needs: No Transportation Needs (04/27/2024)   PRAPARE - Administrator, Civil Service (Medical): No    Lack of Transportation (Non-Medical): No  Physical Activity: Not on file  Stress: Not on file  Social Connections: Moderately Isolated (04/27/2024)   Social Connection and Isolation Panel    Frequency of Communication with Friends and Family: More than three times a week    Frequency of Social Gatherings with Friends and Family: Once a week    Attends Religious Services: Never    Database administrator or Organizations: No    Attends Engineer, structural: Never    Marital Status: Married   Water engineer and Provision:  Detailed education and instructions provided on heart failure disease management including the following:  Signs  and symptoms of Heart Failure When to call the physician Importance of daily weights Low sodium diet Fluid restriction Medication management Anticipated future follow-up appointments  Patient education given on each of the above topics.  Patient acknowledges understanding via teach back method and acceptance of all instructions.  Education Materials:  Living Better With Heart Failure Booklet, HF zone tool, & Daily Weight Tracker Tool.  Patient has scale at home: yes Patient has pill box at home: NA    High Risk Criteria for Readmission and/or Poor Patient Outcomes: Heart failure hospital admissions (last 6  months): 1  No Show rate: 15 % Difficult social situation: YES, lives with his wife, has No Programmer, applications. Unit CSW working with patient to apply and for assigning him a PCP.  Demonstrates medication adherence: No, ran out of his medication 2-3 weeks ago.  Primary Language: English Literacy level: Reading, writing, and comprehension  Barriers of Care:   Diet/ fluid restrictions ( drinks over 2 gallons per day)  Daily weights  CPAP compliance  Considerations/Referrals:   Referral made to Heart Failure Pharmacist Stewardship: yes Referral made to Heart Failure CSW/NCM TOC: Yes, Insurance,  Referral made to Heart & Vascular TOC clinic: Yes, 05/05/2024 @ 2 pm  Items for Follow-up on DC/TOC: Diet/ fluid restrictions/ daily weights CPAP and Appointment compliance Continued HF education   Stephane Haddock, BSN, RN Heart Failure Teacher, adult education Only

## 2024-04-30 DIAGNOSIS — I5031 Acute diastolic (congestive) heart failure: Secondary | ICD-10-CM

## 2024-04-30 DIAGNOSIS — G4733 Obstructive sleep apnea (adult) (pediatric): Secondary | ICD-10-CM

## 2024-04-30 DIAGNOSIS — N179 Acute kidney failure, unspecified: Secondary | ICD-10-CM

## 2024-04-30 DIAGNOSIS — D509 Iron deficiency anemia, unspecified: Secondary | ICD-10-CM

## 2024-04-30 DIAGNOSIS — I340 Nonrheumatic mitral (valve) insufficiency: Secondary | ICD-10-CM

## 2024-04-30 DIAGNOSIS — D72820 Lymphocytosis (symptomatic): Secondary | ICD-10-CM

## 2024-04-30 DIAGNOSIS — I1310 Hypertensive heart and chronic kidney disease without heart failure, with stage 1 through stage 4 chronic kidney disease, or unspecified chronic kidney disease: Secondary | ICD-10-CM

## 2024-04-30 LAB — BASIC METABOLIC PANEL WITH GFR
Anion gap: 11 (ref 5–15)
BUN: 25 mg/dL — ABNORMAL HIGH (ref 6–20)
CO2: 25 mmol/L (ref 22–32)
Calcium: 8.9 mg/dL (ref 8.9–10.3)
Chloride: 103 mmol/L (ref 98–111)
Creatinine, Ser: 1.83 mg/dL — ABNORMAL HIGH (ref 0.61–1.24)
GFR, Estimated: 45 mL/min — ABNORMAL LOW (ref >60)
Glucose, Bld: 156 mg/dL — ABNORMAL HIGH (ref 70–99)
Potassium: 3.5 mmol/L (ref 3.5–5.1)
Sodium: 139 mmol/L (ref 135–145)

## 2024-04-30 LAB — CBC
HCT: 33.5 % — ABNORMAL LOW (ref 39.0–52.0)
Hemoglobin: 10.5 g/dL — ABNORMAL LOW (ref 13.0–17.0)
MCH: 27 pg (ref 26.0–34.0)
MCHC: 31.3 g/dL (ref 30.0–36.0)
MCV: 86.1 fL (ref 80.0–100.0)
Platelets: 296 K/uL (ref 150–400)
RBC: 3.89 MIL/uL — ABNORMAL LOW (ref 4.22–5.81)
RDW: 15.7 % — ABNORMAL HIGH (ref 11.5–15.5)
WBC: 10.9 K/uL — ABNORMAL HIGH (ref 4.0–10.5)
nRBC: 0 % (ref 0.0–0.2)

## 2024-04-30 MED ORDER — ACETAMINOPHEN 500 MG PO TABS
1000.0000 mg | ORAL_TABLET | Freq: Four times a day (QID) | ORAL | Status: DC | PRN
  Administered 2024-04-30 – 2024-05-06 (×9): 1000 mg via ORAL
  Filled 2024-04-30 (×9): qty 2

## 2024-04-30 MED ORDER — DIPHENHYDRAMINE HCL 25 MG PO CAPS
25.0000 mg | ORAL_CAPSULE | Freq: Once | ORAL | Status: AC
  Administered 2024-04-30: 25 mg via ORAL
  Filled 2024-04-30: qty 1

## 2024-04-30 MED ORDER — ORAL CARE MOUTH RINSE
15.0000 mL | OROMUCOSAL | Status: DC | PRN
Start: 2024-04-30 — End: 2024-05-06

## 2024-04-30 MED ORDER — CARVEDILOL 6.25 MG PO TABS
6.2500 mg | ORAL_TABLET | Freq: Two times a day (BID) | ORAL | Status: DC
  Administered 2024-04-30 – 2024-05-04 (×8): 6.25 mg via ORAL
  Filled 2024-04-30 (×8): qty 1

## 2024-04-30 NOTE — Progress Notes (Signed)
 PT Cancellation Note  Patient Details Name: Jeremy Hood MRN: [id number] DOB: Aug 07, 1976   Cancelled Treatment:    Reason Eval/Treat Not Completed: Other (comment)  New order acknowledged. Patient evaluated this AM and is functioning at an Independent/mod I level with no further acute PT needs identified. PT has signed off. Please re-consult if there is a notable change in pt functional status and we will be happy to assist.  Leontine Roads, PT, DPT [facility] Health  Rehabilitation Services Physical Therapist Office: [phone] Website: [email or web address]  Leontine GORMAN Roads 04/30/2024, 12:54 PM

## 2024-04-30 NOTE — Progress Notes (Signed)
 Pt tolerated C-PAP machine for approximately an hour & then removed, stated that the mask kept moving on his face. Pt requesting Benadryl  for sleep aid. Notified Amilibia, DO; see new orders.  Lonell LITTIE Lyme, RN

## 2024-04-30 NOTE — Evaluation (Signed)
 Physical Therapy Brief Evaluation and Discharge Note Patient Details Name: Jeremy Hood MRN: [id number] DOB: Sep 04, 1976 Today's Date: 04/30/2024   History of Present Illness  48 y.o. male presented 7/9 with breathing difficulty especially at night and admitted for CHF.  PMH of HTN, CKD, multifocal cavitary lung disease, new diagnosed CHF.  Clinical Impression  Patient evaluated by Physical Therapy with no further acute PT needs identified. Ambulates at a mod I level with speed likely decreased modestly from baseline but mechanics within functional limits. SpO2 90-92% when good waveform obtained, while on room air. 95% at rest. Moderate dyspnea. Educated on energy conservation, RPE awareness with activities, and gradual return to daily activity level as tolerated. Works as a Investment banker, operational at [employer]. Anticipate he will progress well with medical treatment but discussed if pt finds that he is struggling to complete his work requirements after d/c, consider speaking with his cardiologist about cardiac rehab. However I do not anticipate this to be an issue as most of his complaints are with the difficulty breathing while lying supine at night. All education has been completed and the patient has no further questions.  See below for any follow-up Physical Therapy or equipment needs. PT is signing off. Thank you for this referral.        PT Assessment Patient does not need any further PT services  Assistance Needed at Discharge  None    Equipment Recommendations None recommended by PT  Recommendations for Other Services       Precautions/Restrictions Precautions Precautions: None Restrictions Weight Bearing Restrictions Per Provider Order: No        Mobility  Bed Mobility Rolling: Independent Supine/Sidelying to sit: Independent Sit to supine/sidelying: Independent General bed mobility comments: ind not assist with bed mobility  Transfers Overall transfer level:  Independent Equipment used: None               General transfer comment: Ind no AD used    Ambulation/Gait Ambulation/Gait assistance: Modified independent (Device/Increase time) Gait Distance (Feet): 275 Feet Assistive device: None Gait Pattern/deviations: WFL(Within Functional Limits) Gait Speed: Below normal General Gait Details: Grossly WNL, likely slower than baseline. SpO2 90-92% on RA when good waveform produced. No LOB. Moderate dyspnea. Educated on energy conservation, RPE awareness.  Home Activity Instructions Home Activity Instructions: Monitor symptoms, ADLs as tolerated. Follow MD instructions for when to return to work and when to increase activity.  Stairs Stairs:  (Adequate for d/c)          Modified Rankin (Stroke Patients Only)        Balance Overall balance assessment: Independent                        Pertinent Vitals/Pain PT - Brief Vital Signs All Vital Signs Stable: Other (comment) (SpO2 95% on RA at rest, 90-92% on RA with gait. Moderate dyspnea. HR 90s.) Pain Assessment Pain Assessment: No/denies pain     Home Living Family/patient expects to be discharged to:: Private residence Living Arrangements: Spouse/significant other Available Help at Discharge: Family;Available PRN/intermittently Home Environment: Stairs to enter;Rail - left;Rail - right  Stairs-Number of Steps: 2 Home Equipment: None        Prior Function Level of Independence: Independent Comments: Works as a Investment banker, operational at [employer] Attempting to get back to his old job working at [employer].    UE/LE Assessment   UE ROM/Strength/Tone/Coordination: [facility]    LE ROM/Strength/Tone/Coordination: [facility]  Communication   Communication Communication: No apparent difficulties     Cognition Overall Cognitive Status: Appears within functional limits for tasks assessed/performed       General Comments General comments (skin integrity,  edema, etc.): Reports 50% back to baseline, feels that he is mobilizing well but still having trouble breathing while attempting to lie down and sleep.    Exercises     Assessment/Plan    PT Problem List         PT Visit Diagnosis Difficulty in walking, not elsewhere classified (R26.2)    No Skilled PT All education completed;Patient will have necessary level of assist by caregiver at discharge;Patient is modified independent with all activity/mobility   Co-evaluation                AMPAC 6 Clicks Help needed turning from your back to your side while in a flat bed without using bedrails?: None Help needed moving from lying on your back to sitting on the side of a flat bed without using bedrails?: None Help needed moving to and from a bed to a chair (including a wheelchair)?: None Help needed standing up from a chair using your arms (e.g., wheelchair or bedside chair)?: None Help needed to walk in hospital room?: None Help needed climbing 3-5 steps with a railing? : None 6 Click Score: 24      End of Session   Activity Tolerance: Patient tolerated treatment well Patient left: in bed;with call bell/phone within reach;with family/visitor present Nurse Communication: Mobility status (sats) PT Visit Diagnosis: Difficulty in walking, not elsewhere classified (R26.2)     Time: 9091-9079 PT Time Calculation (min) (ACUTE ONLY): 12 min  Charges:   PT Evaluation $PT Eval Low Complexity: 1 Low      Leontine Roads, PT, DPT [facility]  Rehabilitation Services Physical Therapist Office: [phone] Website: [email or web address]   Leontine GORMAN Roads  04/30/2024, 9:39 AM

## 2024-04-30 NOTE — Progress Notes (Signed)
 HD#3 SUBJECTIVE:  Patient Summary: Jeremy Hood is a 48 y.o. with a pertinent PMH of uncontrolled HTN. substance use disorder, suspected OSA, no prior PCP, and suspected OSA who presented with dyspnea and admitted for acute HfrEF (LVEF30-35%) and moderate MR likely in the setting of untcontrolled HTN and cocaine use currently diuresing  and attempting HFrEF GDMT  Overnight Events: Tried cPAP for an hour but did not tolerate  Interim History: Doing much better this AM in that he has been able to breathe easier at rest and with ambulation. Has been able to lie flat, as well. Headaches improved yesterday night with tylenol . Increased urine output over the past 12 hour.   OBJECTIVE:  Vital Signs: Vitals:   04/29/24 1512 04/29/24 2019 04/29/24 2327 04/30/24 0425  BP: (!) 159/109 (!) 163/101 (!) 151/78 (!) 163/105  Pulse: 84 84 91 94  Resp: 20 (!) 21 18 20   Temp: 98.3 F (36.8 C) 98.4 F (36.9 C) 98.5 F (36.9 C) 98.7 F (37.1 C)  TempSrc: Oral Oral Oral Oral  SpO2:  99% 94% 96%  Weight:    132.4 kg  Height:       Supplemental O2: Room Air   Filed Weights   04/28/24 0500 04/29/24 0500 04/30/24 0425  Weight: 133.4 kg 133.7 kg 132.4 kg     Intake/Output Summary (Last 24 hours) at 04/30/2024 0553 Last data filed at 04/30/2024 0450 Gross per 24 hour  Intake 1671.5 ml  Output 4975 ml  Net -3303.5 ml   Net IO Since Admission: -5,308.5 mL [04/30/24 0553]  Physical Exam: General: Pleasant, well-appearing man sitting on side of the bed without apparent distress Head: moist mucus membranes CV: RRR. NSR on telemetry. No murmus. No LE edema Pulmonary: Lungs CTAB. No rales auscultated today. No wheezing Abdominal: Soft, nontender, protuberant without brawny edema. Skin: Warm and dry. No obvious rash or lesions. Neuro: A& oriented to situation. Conversing appropriately Psych: Normal mood and affect  Patient Lines/Drains/Airways Status     Active Line/Drains/Airways      Name Placement date Placement time Site Days   Peripheral IV 04/27/24 20 G Anterior;Right Forearm 04/27/24  1148  Forearm  3   Wound / Incision (Open or Dehisced) 12/25/15 Coccyx I and d abscess 12/25/15  1538  Coccyx  3049            Pertinent labs and imaging:      Latest Ref Rng & Units 04/30/2024    2:20 AM 04/29/2024    3:21 AM 04/28/2024    3:59 AM  CBC  WBC 4.0 - 10.5 K/uL 10.9  10.5  7.3   Hemoglobin 13.0 - 17.0 g/dL 89.4  89.1  89.1   Hematocrit 39.0 - 52.0 % 33.5  33.9  34.1   Platelets 150 - 400 K/uL 296  286  266        Latest Ref Rng & Units 04/30/2024    2:20 AM 04/29/2024    8:51 PM 04/29/2024    3:21 AM  CMP  Glucose 70 - 99 mg/dL 843  872  895   BUN 6 - 20 mg/dL 25  21  24    Creatinine 0.61 - 1.24 mg/dL 8.16  8.54  8.68   Sodium 135 - 145 mmol/L 139  140  141   Potassium 3.5 - 5.1 mmol/L 3.5  3.3  3.9   Chloride 98 - 111 mmol/L 103  104  107   CO2 22 - 32 mmol/L 25  26  24   Calcium 8.9 - 10.3 mg/dL 8.9  8.8  8.8     No results found.  ASSESSMENT/PLAN:  Assessment: Principal Problem:   Acute heart failure (HCC) Active Problems:   Uncontrolled hypertension   Tobacco use   Cocaine use disorder (HCC)   Paroxysmal nocturnal dyspnea   Stage 3b chronic kidney disease (HCC)   HFrEF (heart failure with reduced ejection fraction) (HCC)   Plan: #HrEF LVEF 30-35% Moderate MR Diuresed 4.9L, with net negative 3.3L after second day of IV lasix  40 mg BID. Aki today; will pause diuresis. Improvement in lung exam today without no signs of peripheral congestion.  No supplemental O2 requirement overnight - Continue Bidil  20-37.5 mg TID - Continue Spironolactone  25 mg daily - Continue Losartan  12.5 mg - Hold Lasix  today; will reassess tomorrow - Start low dose Coreg  6.125 mg BID - Monitor in/outs, daily weights, and supplementing electrolytes, goal K >4, Mg >2  Uncontrolled HTN Better controlled yesterday with Bidil , increased spironolactone , and new addition  of Losartan . Will add Coreg  as above  AKI 1.45>1.83 today after 4.9L diuresed yesterday. Spiro and losartan  changes yesterday likely also contributing. Will pause today to allow fluid to move to intravascular space. - Pause Lasix  today - Monitor I/Os  Iron  deficiency anemia Stable with Hgb 10.8 - Continue PO iron  supplementation  Leukocytosis  Mild increase to 10.9 from 10.5. No fevers or signs of infection today. Does not follow hemoconcentration pattern.  - CTM  OSA/OHS Has not been tolerating cPAP in house. Patient is not hypoxic but continues to be tachypneic  Primary care needs SDOh -Patient desires to establish care at [facility] - Uninsured; currently working on medication assistance - Patient works as a Designer, fashion/clothing and will need work note to be able to present to follow up appointment  Best Practice: Diet: Regular diet with fluid restriction VTE: rivaroxaban  (XARELTO ) tablet 10 mg Start: 04/27/24 1345 Code: Full  Disposition planning: Therapy Recs: pending Family Contact: spouse, at bedside DISPO: Anticipated discharge in ~2 days pending clinical improvement.  Signature:  [name]  5:53 AM, 04/30/2024  On Call pager [phone]

## 2024-05-01 LAB — BASIC METABOLIC PANEL WITH GFR
Anion gap: 8 (ref 5–15)
BUN: 23 mg/dL — ABNORMAL HIGH (ref 6–20)
CO2: 22 mmol/L (ref 22–32)
Calcium: 8.6 mg/dL — ABNORMAL LOW (ref 8.9–10.3)
Chloride: 107 mmol/L (ref 98–111)
Creatinine, Ser: 1.27 mg/dL — ABNORMAL HIGH (ref 0.61–1.24)
GFR, Estimated: >60 mL/min (ref >60)
Glucose, Bld: 135 mg/dL — ABNORMAL HIGH (ref 70–99)
Potassium: 3.8 mmol/L (ref 3.5–5.1)
Sodium: 137 mmol/L (ref 135–145)

## 2024-05-01 LAB — MAGNESIUM: Magnesium: 2 mg/dL (ref 1.7–2.4)

## 2024-05-01 MED ORDER — POTASSIUM CHLORIDE CRYS ER 20 MEQ PO TBCR
20.0000 meq | EXTENDED_RELEASE_TABLET | Freq: Once | ORAL | Status: AC
  Administered 2024-05-01: 20 meq via ORAL
  Filled 2024-05-01: qty 1

## 2024-05-01 MED ORDER — LOSARTAN POTASSIUM 25 MG PO TABS
25.0000 mg | ORAL_TABLET | Freq: Every day | ORAL | Status: DC
  Administered 2024-05-01 – 2024-05-02 (×2): 25 mg via ORAL
  Filled 2024-05-01 (×2): qty 1

## 2024-05-01 MED ORDER — HYDRALAZINE HCL 25 MG PO TABS
25.0000 mg | ORAL_TABLET | Freq: Three times a day (TID) | ORAL | Status: DC
  Administered 2024-05-01 – 2024-05-02 (×3): 25 mg via ORAL
  Filled 2024-05-01 (×3): qty 1

## 2024-05-01 MED ORDER — ISOSORBIDE MONONITRATE 20 MG PO TABS
20.0000 mg | ORAL_TABLET | Freq: Once | ORAL | Status: AC
  Administered 2024-05-01: 20 mg via ORAL
  Filled 2024-05-01: qty 1

## 2024-05-01 MED ORDER — FUROSEMIDE 40 MG PO TABS
40.0000 mg | ORAL_TABLET | Freq: Every day | ORAL | Status: DC
  Administered 2024-05-01 – 2024-05-02 (×2): 40 mg via ORAL
  Filled 2024-05-01 (×2): qty 1

## 2024-05-01 NOTE — Plan of Care (Signed)
   Problem: Education: Goal: Knowledge of General Education information will improve Description Including pain rating scale, medication(s)/side effects and non-pharmacologic comfort measures Outcome: Progressing   Problem: Health Behavior/Discharge Planning: Goal: Ability to manage health-related needs will improve Outcome: Progressing

## 2024-05-01 NOTE — Plan of Care (Signed)

## 2024-05-01 NOTE — Progress Notes (Signed)
 I have personally seen and examined the patient and agree with the following evaluation and plan of care for this patient.  -Sallyanne Primas, DO [facility]   HD#4 SUBJECTIVE:  Patient Summary: Jeremy Hood is a 48 y.o. with a pertinent PMH of HTN, CKD, multifocal cavitary lung disease, new diagnosed CHF (HFrEF, EF 30-35%), who presented with breathing difficulty, especially at night, and admitted for newly diagnosed CHF. He had noticed breathing trouble continuing for the last few months, worse when he sleeps, and shortness of breath when he exerts himself at work. He was previously prescribed amlodipine  10 mg but has not been currently taking this.   Overnight Events:  No overnight events  Interim History: Patient denies CP, SOB, endorsed HA 4/10. Patient did not sleep well last night and still needs pillow to sleep. At baseline cannot sleep flat.   OBJECTIVE:  Vital Signs: Vitals:   04/30/24 1938 04/30/24 2315 05/01/24 0528 05/01/24 0747  BP: (!) 158/105 (!) 164/105 (!) 157/113 (!) 139/95  Pulse: 77 87 96 80  Resp: 20 16 19 20   Temp: 98.8 F (37.1 C) 98.6 F (37 C) 98.5 F (36.9 C) 98.4 F (36.9 C)  TempSrc: Oral Oral Oral Oral  SpO2: 98% 96% 92% 99%  Weight:   134.7 kg   Height:       Supplemental O2: Room Air SpO2: 99 % O2 Flow Rate (L/min): 3 L/min FiO2 (%): 21 %  Filed Weights   04/29/24 0500 04/30/24 0425 05/01/24 0528  Weight: 133.7 kg 132.4 kg 134.7 kg     Intake/Output Summary (Last 24 hours) at 05/01/2024 0906 Last data filed at 05/01/2024 0700 Gross per 24 hour  Intake 720 ml  Output 500 ml  Net 220 ml   Net IO Since Admission: -4,928.5 mL [05/01/24 0906]  Physical Exam: Physical Exam Const:: Awake, alert in NAD HENT: Normocephalic, atraumatic Card: RRR no m/r/g Resp: CTAB. Tachypnea Extremities: 1+ pitting edema on RLE,  Patient Lines/Drains/Airways Status     Active Line/Drains/Airways     Name Placement date  Placement time Site Days   Peripheral IV 04/27/24 20 G Anterior;Right Forearm 04/27/24  1148  Forearm  1   Wound / Incision (Open or Dehisced) 12/25/15 Coccyx I and d abscess 12/25/15  1538  Coccyx  3047            Pertinent labs and imaging:     Latest Ref Rng & Units 04/30/2024    2:20 AM 04/29/2024    3:21 AM 04/28/2024    3:59 AM  CBC  WBC 4.0 - 10.5 K/uL 10.9  10.5  7.3   Hemoglobin 13.0 - 17.0 g/dL 89.4  89.1  89.1   Hematocrit 39.0 - 52.0 % 33.5  33.9  34.1   Platelets 150 - 400 K/uL 296  286  266        Latest Ref Rng & Units 05/01/2024    2:29 AM 04/30/2024    2:20 AM 04/29/2024    8:51 PM  CMP  Glucose 70 - 99 mg/dL 864  843  872   BUN 6 - 20 mg/dL 23  25  21    Creatinine 0.61 - 1.24 mg/dL 8.72  8.16  8.54   Sodium 135 - 145 mmol/L 137  139  140   Potassium 3.5 - 5.1 mmol/L 3.8  3.5  3.3   Chloride 98 - 111 mmol/L 107  103  104   CO2 22 - 32  mmol/L 22  25  26    Calcium 8.9 - 10.3 mg/dL 8.6  8.9  8.8     No results found.  ASSESSMENT/PLAN:  Assessment: Principal Problem:   Acute heart failure (HCC) Active Problems:   Uncontrolled hypertension   Tobacco use   Cocaine use disorder (HCC)   Paroxysmal nocturnal dyspnea   Stage 3b chronic kidney disease (HCC)   HFrEF (heart failure with reduced ejection fraction) (HCC)   Plan: #HFrEF newly diagnosed with acute exacerbation ECHO done confirming HFrEF with EF 30-35%. The LV has moderately decreased function with gloal hypokinesis. LV cavity shows moderate dilation. RV systolic function is moderately reduced and moderately enlarged. Dilation of the LA and RA are appreciated. Moderate mitral valve regurgitation is appreciated. The aortic valve is tricuspid with mild calcification appreciated. Previous AKI 2/2 lasix  has resolved. Gave BiDil  AM 7/13 but will receive hydralazine  and imdur  PM 7/13. 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 -lasix  40 mg tablet -spironolactone  25 mg QD -hydralazine   25mg  -imdur  20 mg -losartan  25 mg (no adverse rxn to 12.5mg , previous angioedema with lisinopril ) -take daily standing weights -AM BMP  #Headache Essential hypertension Patient has persistent hypertension but notes lessening severity; now ranks 4/10 See above medication changes under A&P for HFrEF. Headache likely due to BiDil ; will split dose as listed above. If headache persists, will consider discontinuing Imdur .  -tylenol  PRN   #Paroxysmal nocturnal dyspnea Patient has choking, gasping, and snoring during sleep and must sleep on multiple pillows. Might be a component of OSA. Patient attempted to use CPAP mask again but unable to tolerate -monitor for improvement post diuresis -Discussed outpatient referral for sleep study and intranasal CPAP for greater comfort if necessary  #Polyuria Patient noted nocturia at admission. Ddx: Heart failure, BPH, Diabetes, and UTI. UA positive for protein, no bacteria. PSA WNL. Polyuria at this stage could be attributed to lasix  or BNP elevation or a combination. -monitor for improvement of polyuria after diuresis  #Anemia Patient CBC revealed hgb around 10. TIBC normal, iron  sat 11, suggests iron  deficiency anemia. Recommending outpatient oral/IV iron .  -continue PO iron  during admission.  -AM CBC  #AKI Baseline Creatinine ~1.2 Creatinine 7/12 increased to 1.83. Held lasix  on 7/12 Creatinine 7/13 1.27. Resumed lasix  on 7/13  Best Practice: Diet: Regular diet IVF: Fluids: none, Rate: None VTE: rivaroxaban  (XARELTO ) tablet 10 mg Start: 04/27/24 1345 Code: Full  Disposition planning: Therapy Recs: Pending, DME: none Family Contact: Verneita Claude, to be notified. DISPO: Anticipated discharge pending to Home pending improving clinical status.  Signature:  Brad Prey MS3  9:06 AM, 05/01/2024

## 2024-05-02 DIAGNOSIS — I5020 Unspecified systolic (congestive) heart failure: Secondary | ICD-10-CM

## 2024-05-02 DIAGNOSIS — F1910 Other psychoactive substance abuse, uncomplicated: Secondary | ICD-10-CM

## 2024-05-02 DIAGNOSIS — F1410 Cocaine abuse, uncomplicated: Secondary | ICD-10-CM

## 2024-05-02 DIAGNOSIS — I10 Essential (primary) hypertension: Secondary | ICD-10-CM

## 2024-05-02 DIAGNOSIS — I5021 Acute systolic (congestive) heart failure: Secondary | ICD-10-CM

## 2024-05-02 LAB — BASIC METABOLIC PANEL WITH GFR
Anion gap: 8 (ref 5–15)
BUN: 23 mg/dL — ABNORMAL HIGH (ref 6–20)
CO2: 24 mmol/L (ref 22–32)
Calcium: 8.6 mg/dL — ABNORMAL LOW (ref 8.9–10.3)
Chloride: 108 mmol/L (ref 98–111)
Creatinine, Ser: 1.42 mg/dL — ABNORMAL HIGH (ref 0.61–1.24)
GFR, Estimated: >60 mL/min (ref >60)
Glucose, Bld: 169 mg/dL — ABNORMAL HIGH (ref 70–99)
Potassium: 4.1 mmol/L (ref 3.5–5.1)
Sodium: 140 mmol/L (ref 135–145)

## 2024-05-02 MED ORDER — ISOSORBIDE MONONITRATE ER 30 MG PO TB24
30.0000 mg | ORAL_TABLET | Freq: Every day | ORAL | Status: AC
  Administered 2024-05-02: 30 mg via ORAL
  Filled 2024-05-02: qty 1

## 2024-05-02 MED ORDER — LOSARTAN POTASSIUM 25 MG PO TABS
25.0000 mg | ORAL_TABLET | Freq: Once | ORAL | Status: AC
  Administered 2024-05-02: 25 mg via ORAL
  Filled 2024-05-02: qty 1

## 2024-05-02 MED ORDER — HYDRALAZINE HCL 50 MG PO TABS: 50.0000 mg | ORAL_TABLET | Freq: Three times a day (TID) | ORAL | Status: DC

## 2024-05-02 MED ORDER — POTASSIUM CHLORIDE CRYS ER 20 MEQ PO TBCR
40.0000 meq | EXTENDED_RELEASE_TABLET | Freq: Once | ORAL | Status: AC
  Administered 2024-05-02: 40 meq via ORAL
  Filled 2024-05-02: qty 2

## 2024-05-02 MED ORDER — LOSARTAN POTASSIUM 50 MG PO TABS
50.0000 mg | ORAL_TABLET | Freq: Every day | ORAL | Status: DC
  Filled 2024-05-02: qty 1

## 2024-05-02 MED ORDER — FUROSEMIDE 10 MG/ML IJ SOLN
80.0000 mg | Freq: Two times a day (BID) | INTRAMUSCULAR | Status: AC
  Administered 2024-05-02 (×2): 80 mg via INTRAVENOUS
  Filled 2024-05-02 (×2): qty 8

## 2024-05-02 NOTE — Progress Notes (Signed)
 HD#6 SUBJECTIVE:  Patient Summary: Jeremy Hood is a 48 y.o. with a pertinent PMH of HTN, CKD, cocaine use, multifocal cavitary lung disease, new diagnosed CHF (HFrEF, EF 30-35%), who presented with breathing difficulty, especially at night, and admitted for newly diagnosed CHF. He had noticed breathing trouble continuing for the last few months, worse when he sleeps, and shortness of breath when he exerts himself at work. He was previously prescribed amlodipine  10 mg but has not been currently taking this.   Overnight Events:  Patient had overnight hypertension (162/122,184/124, 167/111 with O2 95-99% on RA).   Interim History: Patient at baseline cannot sleep flat. Patient has continued to have intermittent headaches associated with hydralazine  and imdur  use.   OBJECTIVE:  Vital Signs: Vitals:   05/02/24 0320 05/02/24 0336 05/02/24 0811 05/02/24 1226  BP: (!) 184/124 (!) 162/122 137/75 (!) 154/102  Pulse: 87 91 90 84  Resp: 18  (!) 22 (!) 33  Temp: 98 F (36.7 C)  98 F (36.7 C) 98 F (36.7 C)  TempSrc: Oral  Oral Oral  SpO2: 99%  94% 91%  Weight: 136 kg     Height:       Supplemental O2: Room Air SpO2: 91 % O2 Flow Rate (L/min): 3 L/min FiO2 (%): 21 %  Filed Weights   04/30/24 0425 05/01/24 0528 05/02/24 0320  Weight: 132.4 kg 134.7 kg 136 kg     Intake/Output Summary (Last 24 hours) at 05/02/2024 1255 Last data filed at 05/02/2024 1228 Gross per 24 hour  Intake 1429 ml  Output 1900 ml  Net -471 ml   Net IO Since Admission: -4,919.5 mL [05/02/24 1255]  Physical Exam: Physical Exam Const: Awake, alert in NAD. Sitting on the edge of the bed.  HENT: Normocephalic, atraumatic Card: RRR no m/r/g Resp: CTAB. No increased WOB Extremities: 1+ pitting edema on RLE Patient Lines/Drains/Airways Status     Active Line/Drains/Airways     Name Placement date Placement time Site Days   Peripheral IV 04/27/24 20 G Anterior;Right Forearm 04/27/24  1148  Forearm  1    Wound / Incision (Open or Dehisced) 12/25/15 Coccyx I and d abscess 12/25/15  1538  Coccyx  3047            Pertinent labs and imaging:     Latest Ref Rng & Units 04/30/2024    2:20 AM 04/29/2024    3:21 AM 04/28/2024    3:59 AM  CBC  WBC 4.0 - 10.5 K/uL 10.9  10.5  7.3   Hemoglobin 13.0 - 17.0 g/dL 89.4  89.1  89.1   Hematocrit 39.0 - 52.0 % 33.5  33.9  34.1   Platelets 150 - 400 K/uL 296  286  266        Latest Ref Rng & Units 05/02/2024    2:48 AM 05/01/2024    2:29 AM 04/30/2024    2:20 AM  CMP  Glucose 70 - 99 mg/dL 830  864  843   BUN 6 - 20 mg/dL 23  23  25    Creatinine 0.61 - 1.24 mg/dL 8.57  8.72  8.16   Sodium 135 - 145 mmol/L 140  137  139   Potassium 3.5 - 5.1 mmol/L 4.1  3.8  3.5   Chloride 98 - 111 mmol/L 108  107  103   CO2 22 - 32 mmol/L 24  22  25    Calcium 8.9 - 10.3 mg/dL 8.6  8.6  8.9  No results found.  ASSESSMENT/PLAN:  Assessment: Principal Problem:   Acute heart failure (HCC) Active Problems:   Uncontrolled hypertension   Tobacco use   Cocaine use disorder (HCC)   Paroxysmal nocturnal dyspnea   Stage 3b chronic kidney disease (HCC)   HFrEF (heart failure with reduced ejection fraction) (HCC)   Plan: #HFrEF newly diagnosed with acute exacerbation ECHO done confirming HFrEF with EF 30-35%. The LV has moderately decreased function with gloal hypokinesis. LV cavity shows moderate dilation. RV systolic function is moderately reduced and moderately enlarged. Dilation of the LA and RA are appreciated. Moderate mitral valve regurgitation is appreciated. The aortic valve is tricuspid with mild calcification appreciated. Patient continued to have headaches with hydralazine  25mg  and Imdur  30mg  and hypertension (162/122,184/124, 167/111) overnight.   Cardio was consulted morning 7/14 and opted to increase losartan  dose and discontinue hydralazine  and Imdur . Also opted to increase lasix  from 40mg  to 80mg . They would like to do a cardiac MRI, so would be  optimal to diurese Mr. Abarca more so that he is comfortable lying down with this procedure. 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 -lasix  80 mg IV BID -spironolactone  25 mg daily -coreg  6.25mg  BID -losartan  50 mg daily (no adverse rxn to 25mg , previous angioedema with lisinopril ) -take daily standing weights -AM BMP -pending cardiac MRI  #Headache Essential hypertension See A&P for HFrEF -tylenol  PRN for pain  #Paroxysmal nocturnal dyspnea Patient has choking, gasping, and snoring during sleep and must sleep on multiple pillows. Might be a component of OSA. Patient has attempted to use CPAP mask during this admission but unable to tolerate -monitor for improvement post diuresis -Discussed outpatient referral for sleep study and intranasal CPAP for greater comfort if necessary  #Polyuria Patient noted nocturia at admission. Ddx: Heart failure, BPH, Diabetes, and UTI. UA positive for protein, no bacteria. PSA WNL. Polyuria at this stage could be attributed to lasix  or BNP elevation or a combination. -monitor for improvement of polyuria after diuresis  #Anemia Patient CBC revealed hgb around 10. TIBC normal, iron  sat 11, suggests iron  deficiency anemia. Recommending outpatient oral/IV iron .  -continue PO iron  during admission.   #AKI Baseline Creatinine ~1.2 Creatinine 7/12 increased to 1.83. Held lasix  on 7/12 Creatinine 7/13 1.27. Resumed lasix  on 7/13.  Creatinine 7/14 1.42, likely secondary to losartan  addition.  Best Practice: Diet: Regular diet IVF: Fluids: none, Rate: None VTE: Place TED hose Start: 05/02/24 1139 rivaroxaban  (XARELTO ) tablet 10 mg Start: 04/27/24 1345 Code: Full  Disposition planning: Therapy Recs: Pending, DME: none Family Contact: Verneita Jona, to be notified. DISPO: Anticipated discharge pending to Home pending improving clinical status.  Signature:  Brad Prey MS3  12:55 PM, 05/02/2024     I have personally seen  and examined the patient and agree with the following evaluation and plan of care for this patient.  -Sallyanne Primas, DO [facility]

## 2024-05-02 NOTE — Consult Note (Signed)
 Advanced Heart Failure Team Consult Note   Primary Physician: Patient, No Pcp Per Cardiologist:  None  Reason for Consultation: acute systolic heart failure   HPI:    Jeremy Hood is seen today for evaluation of acute systolic heart failure at the request of Dr. Rosan, Internal Medicine  48 y/o AAM w/ h/o untreated, uncontrolled HTN, diastolic heart failure and polysubstance use. H/o angioedema w/ ACEi.   He was admitted in 2018 for chest pain and acute diastolic heart failure. He was also found to be cocaine + on admit. Echo then showed normal LVEF 55-60%, severe LVH w/ GIDD, RV nl. NST interpreted a mild area of reversibility anteriorly concerning for ischemia, however on further investigation, anterior attenuation was felt to be 2/2 chest wall artifact. Chest pain and trop bump felt likely 2/2 demand ischemia from CHF and HTN. Did not get LHC. Treated medically but failed to f/u w/ cardiology.   Now admitted w/ new systolic heart failure. Uninsured. Not taking meds for HTN. Continues w/ substance use. Uses cocaine, on average every other month. Drinks ETOH and smokes THC.   Started feeling bad several months ago. Started w/ mild SOB and cough. Marily went to an UC in Feb and diagnosed w/ PNA and bronchitis. Had slight improvement w/ abx but symptoms persistent and worsened over the last several months. 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 Recently developed LEE and orthopnea/PND prompting ED evaluation. Diagnosed w/ CHF. BP markedly elevated in ED 180s/120s. Admit SCr 1.5, K 3.4, BNP 1300, Hs trop 137>>127. EKG NSR w/ RBBB 89 bpm. Echo shows EF 30-35%, mod LVH, GIIDD, mod LAE, mod MR, RV mod reduced.   He had initial bump in SCr from 1.5>>1.8 this admission, but SCr now starting to improve w/ diuresis, down to 1.42 today. BP remains elevated, 150s/low 100s. He is diuresing w/ IV Lasix  but c/w exertional dyspnea, orthopnea and LEE.  He denies CP.   Also noted to have IDA. He reports h/o hemorrhoidal bleeding.    Echo 04/28/24  1. Left ventricular ejection fraction, by estimation, is 30 to 35%. The  left ventricle has moderately decreased function. The left ventricle  demonstrates global hypokinesis. The left ventricular internal cavity size  was moderately dilated. There is  moderate concentric left ventricular hypertrophy. Left ventricular  diastolic parameters are consistent with Grade II diastolic dysfunction  (pseudonormalization).   2. Right ventricular systolic function is moderately reduced. The right  ventricular size is moderately enlarged.   3. Left atrial size was moderately dilated.   4. Right atrial size was mildly dilated.   5. The mitral valve is normal in structure. Moderate mitral valve  regurgitation. No evidence of mitral stenosis.   6. The aortic valve is tricuspid. There is mild calcification of the  aortic valve. Aortic valve regurgitation is trivial. Aortic valve  sclerosis/calcification is present, without any evidence of aortic  stenosis.    Home Medications Prior to Admission medications   Medication Sig Start Date End Date Taking? Authorizing Provider  gabapentin (NEURONTIN) 300 MG capsule Take 300 mg by mouth daily as needed (pain, neuropathy).   Yes [provider]  amLODipine  (NORVASC ) 10 MG tablet Take 1 tablet (10 mg total) by mouth daily. Patient not taking: Reported on 04/27/2024 09/25/23   Vonna Sharlet POUR, MD  lisinopril  (ZESTRIL ) 10 MG tablet Take 1 tablet (10 mg total) by mouth daily. 03/15/20 02/09/21  Hall-Potvin, [place], PA-C  Past Medical History: Past Medical History:  Diagnosis Date   Chest pain 11/2016   Hypertension     Past Surgical History: Past Surgical History:  Procedure Laterality Date   ADENOIDECTOMY     TEE WITHOUT CARDIOVERSION N/A 11/26/2016   Procedure: TRANSESOPHAGEAL ECHOCARDIOGRAM (TEE);  Surgeon: Aleene JINNY Passe, MD;  Location: [facility]  ENDOSCOPY;  Service: Cardiovascular;  Laterality: N/A;   VIDEO BRONCHOSCOPY Bilateral 12/02/2016   Procedure: VIDEO BRONCHOSCOPY WITHOUT FLUORO;  Surgeon: Toribio JINNY Domino, MD;  Location: [facility] ENDOSCOPY;  Service: Cardiopulmonary;  Laterality: Bilateral;    Family History: Family History  Problem Relation Age of Onset   Hypertension Mother    Diabetes Mother     Social History: Social History   Socioeconomic History   Marital status: Married    Spouse name: Not on file   Number of children: 2   Years of education: Not on file   Highest education level: High school graduate  Occupational History   Occupation: [employer]  Tobacco Use   Smoking status: Every Day    Current packs/day: 0.00    Average packs/day: 0.5 packs/day for 20.0 years (10.0 ttl pk-yrs)    Types: Cigarettes    Start date: 11/23/1996    Last attempt to quit: 11/23/2016    Years since quitting: 7.4   Smokeless tobacco: Never  Vaping Use   Vaping status: Never Used  Substance and Sexual Activity   Alcohol  use: Not Currently   Drug use: Yes    Types: Marijuana    Comment: everyday   Sexual activity: Not Currently  Other Topics Concern   Not on file  Social History Narrative   Not on file   Social Drivers of Health   Financial Resource Strain: High Risk (04/29/2024)   Overall Financial Resource Strain (CARDIA)    Difficulty of Paying Living Expenses: Hard  Food Insecurity: No Food Insecurity (04/27/2024)   Hunger Vital Sign    Worried About Running Out of Food in the Last Year: Never true    Ran Out of Food in the Last Year: Never true  Transportation Needs: No Transportation Needs (04/27/2024)   PRAPARE - Administrator, Civil Service (Medical): No    Lack of Transportation (Non-Medical): No  Physical Activity: Not on file  Stress: Not on file  Social Connections: Moderately Isolated (04/27/2024)   Social Connection and Isolation Panel    Frequency of Communication with Friends and Family:  More than three times a week    Frequency of Social Gatherings with Friends and Family: Once a week    Attends Religious Services: Never    Database administrator or Organizations: No    Attends Banker Meetings: Never    Marital Status: Married    Allergies:  Allergies  Allergen Reactions   Zestril  [Lisinopril ] Swelling    Angioedema     Objective:    Vital Signs:   Temp:  [98 F (36.7 C)-98.9 F (37.2 C)] 98 F (36.7 C) (07/14 0811) Pulse Rate:  [78-91] 90 (07/14 0811) Resp:  [18-28] 22 (07/14 0811) BP: (137-184)/(75-124) 137/75 (07/14 0811) SpO2:  [94 %-99 %] 94 % (07/14 0811) Weight:  [136 kg] 136 kg (07/14 0320) Last BM Date : 05/01/24  Weight change: Filed Weights   04/30/24 0425 05/01/24 0528 05/02/24 0320  Weight: 132.4 kg 134.7 kg 136 kg    Intake/Output:   Intake/Output Summary (Last 24 hours) at 05/02/2024 1017 Last data filed at 05/02/2024  0500 Gross per 24 hour  Intake 1669 ml  Output 1700 ml  Net -31 ml      Physical Exam    General:  obese. No resp difficulty HEENT: normal Neck: supple. Thick neck, JVD not well visualized. Carotids 2+ bilat; no bruits. No lymphadenopathy or thyromegaly appreciated. Cor: PMI nondisplaced. Regular rate & rhythm.  2/6 MR murmur  Lungs: decreased BS at the bases  Abdomen: obese, soft, nontender, nondistended. No hepatosplenomegaly. No bruits or masses. Good bowel sounds. Extremities: no cyanosis, clubbing, rash, 2+ b/l pitting pretibial edema Neuro: alert & orientedx3, cranial nerves grossly intact. moves all 4 extremities w/o difficulty. Affect pleasant   Telemetry   NSR 90s, personally reviewed   EKG    No new EKG to review   Labs   Basic Metabolic Panel: Recent Labs  Lab 04/28/24 0359 04/28/24 1814 04/29/24 0321 04/29/24 2051 04/30/24 0220 05/01/24 0229 05/02/24 0248  NA 141 141 141 140 139 137 140  K 3.6 3.9 3.9 3.3* 3.5 3.8 4.1  CL 105 107 107 104 103 107 108  CO2 25 26 24 26  25 22 24   GLUCOSE 174* 161* 104* 127* 156* 135* 169*  BUN 23* 24* 24* 21* 25* 23* 23*  CREATININE 1.34* 1.45* 1.31* 1.45* 1.83* 1.27* 1.42*  CALCIUM 8.7* 8.7* 8.8* 8.8* 8.9 8.6* 8.6*  MG 1.8 1.7 2.2  --   --  2.0  --     Liver Function Tests: No results for input(s): AST, ALT, ALKPHOS, BILITOT, PROT, ALBUMIN in the last 168 hours. No results for input(s): LIPASE, AMYLASE in the last 168 hours. No results for input(s): AMMONIA in the last 168 hours.  CBC: Recent Labs  Lab 04/27/24 0929 04/28/24 0359 04/29/24 0321 04/30/24 0220  WBC 7.7 7.3 10.5 10.9*  HGB 11.0* 10.8* 10.8* 10.5*  HCT 34.7* 34.1* 33.9* 33.5*  MCV 87.2 87.0 87.4 86.1  PLT 279 266 286 296    Cardiac Enzymes: No results for input(s): CKTOTAL, CKMB, CKMBINDEX, TROPONINI in the last 168 hours.  BNP: BNP (last 3 results) Recent Labs    04/27/24 1100  BNP 1,356.9*    ProBNP (last 3 results) No results for input(s): PROBNP in the last 8760 hours.   CBG: No results for input(s): GLUCAP in the last 168 hours.  Coagulation Studies: No results for input(s): LABPROT, INR in the last 72 hours.   Imaging   No results found.   Medications:     Current Medications:  carvedilol   6.25 mg Oral BID WC   ferrous sulfate   325 mg Oral Q breakfast   furosemide   40 mg Oral Daily   hydrALAZINE   50 mg Oral Q8H   losartan   25 mg Oral Daily   rivaroxaban   10 mg Oral Daily   senna-docusate  2 tablet Oral QHS   spironolactone   25 mg Oral Daily    Infusions:     Patient Profile   48 y/o male w/ h/o untreated/uncontrolled HTN, diastolic heart failure, poor compliance and substance use, admitted w/ new systolic heart failure.   Assessment/Plan   1. Acute Systolic Heart Failure - Echo 7981 EF 60-65%, severe LVH w/ GIDD, RV ok - Echo this admit, EF now 30-35%, mod LVH, RV mod reduced  - NYHA IIIb on admit - Improving w/ diuresis but remains symptomatic and volume  overloaded - continue diuresis, transition back to IV 80 mg bid - place TED hoses for edema - Suspect most likely HTN CM but will  need to r/o infiltrative process. Plan cMRI after diuresis. Doubt ischemic but if EF does not improve in 3 months w/ good GDMT/ BP control, will need LHC  - needs better afterload reduction, increase Losartan  to 50 mg daily  - no Entresto given angioedema w/ ACE-I  - continue spironolactone  25 mg daily  - stop Nitrate/Hydral for now given HAs - continue Coreg  6.25 mg bid   - imperative to quit cocaine   2. Hypertension  - remains elevated - HF GDMT titration per above  - consider RA dopplers - uninsured. If able to get insurance can consider outpatient sleep study   3. Polysubstance use - advised to quit cocaine and THC   4. IDA - ? If 2/2 hemorrhoidal bleeding  - will wait to give IV Fe until after completion of cMRI  - will need PCP and outpatient w/u     Length of Stay: [address] PA-C  05/02/2024, 10:17 AM    Advanced Heart Failure Team Pager [phone] (M-F; 7a - 5p)  Please contact [facility] for night-coverage after hours (4p -7a ) and weekends on [email or web address]

## 2024-05-02 NOTE — Progress Notes (Signed)
 Heart Failure Navigator Progress Note  Assessed for Heart & Vascular TOC clinic readiness.  Patient original HF TOC appointment on 05/05/2024 was cancelled , due to Advanced heart Failure Team was consulted. .   Navigator will sign off at this time.   Stephane Haddock, BSN, Scientist, clinical (histocompatibility and immunogenetics) Only

## 2024-05-03 ENCOUNTER — Inpatient Hospital Stay (HOSPITAL_COMMUNITY): Payer: Self-pay

## 2024-05-03 ENCOUNTER — Encounter (HOSPITAL_COMMUNITY): Payer: Self-pay

## 2024-05-03 DIAGNOSIS — I5020 Unspecified systolic (congestive) heart failure: Secondary | ICD-10-CM

## 2024-05-03 LAB — BASIC METABOLIC PANEL WITH GFR
Anion gap: 12 (ref 5–15)
BUN: 26 mg/dL — ABNORMAL HIGH (ref 6–20)
CO2: 25 mmol/L (ref 22–32)
Calcium: 9 mg/dL (ref 8.9–10.3)
Chloride: 102 mmol/L (ref 98–111)
Creatinine, Ser: 1.41 mg/dL — ABNORMAL HIGH (ref 0.61–1.24)
GFR, Estimated: >60 mL/min (ref >60)
Glucose, Bld: 128 mg/dL — ABNORMAL HIGH (ref 70–99)
Potassium: 3.8 mmol/L (ref 3.5–5.1)
Sodium: 139 mmol/L (ref 135–145)

## 2024-05-03 LAB — MAGNESIUM: Magnesium: 1.9 mg/dL (ref 1.7–2.4)

## 2024-05-03 MED ORDER — EMPAGLIFLOZIN 10 MG PO TABS
10.0000 mg | ORAL_TABLET | Freq: Every day | ORAL | Status: DC
  Administered 2024-05-03 – 2024-05-06 (×4): 10 mg via ORAL
  Filled 2024-05-03 (×4): qty 1

## 2024-05-03 MED ORDER — IRBESARTAN 150 MG PO TABS: 300.0000 mg | ORAL_TABLET | Freq: Every day | ORAL | Status: DC

## 2024-05-03 MED ORDER — FUROSEMIDE 10 MG/ML IJ SOLN
80.0000 mg | Freq: Two times a day (BID) | INTRAMUSCULAR | Status: AC
  Administered 2024-05-03 (×2): 80 mg via INTRAVENOUS
  Filled 2024-05-03 (×2): qty 8

## 2024-05-03 MED ORDER — LOSARTAN POTASSIUM 50 MG PO TABS
100.0000 mg | ORAL_TABLET | Freq: Every day | ORAL | Status: DC
  Administered 2024-05-03: 100 mg via ORAL
  Filled 2024-05-03: qty 2

## 2024-05-03 MED ORDER — GADOBUTROL 1 MMOL/ML IV SOLN
10.0000 mL | Freq: Once | INTRAVENOUS | Status: AC | PRN
  Administered 2024-05-03: 10 mL via INTRAVENOUS

## 2024-05-03 MED ORDER — MAGNESIUM SULFATE 2 GM/50ML IV SOLN
2.0000 g | Freq: Once | INTRAVENOUS | Status: AC
  Administered 2024-05-03: 2 g via INTRAVENOUS
  Filled 2024-05-03: qty 50

## 2024-05-03 MED ORDER — POTASSIUM CHLORIDE CRYS ER 20 MEQ PO TBCR
40.0000 meq | EXTENDED_RELEASE_TABLET | Freq: Once | ORAL | Status: AC
Start: 2024-05-03 — End: 2024-05-03
  Administered 2024-05-03: 40 meq via ORAL
  Filled 2024-05-03: qty 2

## 2024-05-03 NOTE — TOC Progression Note (Signed)
 Transition of Care [facility]) - Progression Note    Patient Details  Name: Jeremy Hood MRN: [id number] Date of Birth: 1976-09-06  [facility] CM/SW Contact  Lauraine FORBES Saa, LCSW Phone Number: 05/03/2024, 11:27 AM  Clinical Narrative:     11:27 AM No CSW needs were identified at this time. CSW will continue to follow.  Expected Discharge Plan: Home/Self Care Barriers to Discharge: Continued Medical Work up  Expected Discharge Plan and Services In-house Referral: Artist, PCP / Health Connect Discharge Planning Services: CM Consult, MATCH Program, Follow-up appt scheduled   Living arrangements for the past 2 months: Single Family Home                                       Social Determinants of Health (SDOH) Interventions SDOH Screenings   Food Insecurity: No Food Insecurity (04/27/2024)  Housing: Low Risk  (04/27/2024)  Transportation Needs: No Transportation Needs (04/27/2024)  Utilities: Not At Risk (04/27/2024)  Alcohol  Screen: Low Risk  (04/29/2024)  Financial Resource Strain: High Risk (04/29/2024)  Social Connections: Moderately Isolated (04/27/2024)  Tobacco Use: High Risk (04/29/2024)    Readmission Risk Interventions     No data to display

## 2024-05-03 NOTE — Plan of Care (Signed)

## 2024-05-03 NOTE — TOC CM/SW Note (Signed)
 MATCH MEDICATION ASSISTANCE CARD Pharmacies please call [phone] for claim processing assistance.  Rx BIN: [id number] Rx Group: [id number] Rx PCN: [id number] Relationship Code: 1 Person Code: 01  Patient ID (MRN): [id number]  [id number]   Patient Name: Jeremy Hood   Patient DOB: [birth year]   Discharge Date: 05/04/2024  Expiration Date: (must be filled within 7 days of discharge)

## 2024-05-03 NOTE — TOC Progression Note (Addendum)
 Transition of Care [facility]) - Progression Note    Patient Details  Name: Jeremy Hood MRN: [id number] Date of Birth: [birth year]  [facility]) CM/SW Contact  Justina Delcia Czar, RN Phone Number: [phone] 05/03/2024, 6:14 PM  Clinical Narrative:     TOC CM spoke to pt and states he does not qualify for Medicaid. States he works full-time. Medications covered by HF funds/MATCH.  Patient will need a note for work.  Pt reports he has scale at home for daily weights. Provided pt with Living Better with HF booklet. Discussed low sodium/heart healthy diet.   Will arrange hospital follow up appt with PCP at dc. Pt states he needs PCP.   Wife will provide transportation for home.   Expected Discharge Plan: Home/Self Care Barriers to Discharge: Continued Medical Work up  Expected Discharge Plan and Services In-house Referral: Artist, PCP / Health Connect Discharge Planning Services: CM Consult, MATCH Program, Follow-up appt scheduled   Living arrangements for the past 2 months: Single Family Home                   Social Determinants of Health (SDOH) Interventions SDOH Screenings   Food Insecurity: No Food Insecurity (04/27/2024)  Housing: Low Risk  (04/27/2024)  Transportation Needs: No Transportation Needs (04/27/2024)  Utilities: Not At Risk (04/27/2024)  Alcohol  Screen: Low Risk  (04/29/2024)  Financial Resource Strain: High Risk (04/29/2024)  Social Connections: Moderately Isolated (04/27/2024)  Tobacco Use: High Risk (04/29/2024)    Readmission Risk Interventions     No data to display

## 2024-05-03 NOTE — Progress Notes (Addendum)
 Advanced Heart Failure Rounding Note  Cardiologist: None  Chief Complaint: Acute systolic heart failure Subjective:    Remains hypertensive 170/100s. Diuresed well 7.4L UOP (net - 6.7L), weight down 5lbs.  Labs pending.  Feeling better this morning, although didn't sleep much overnight due to disruptions. Was able to lay mostly flat throughout the night. + cough this morning.   Objective:    Weight Range: 133.5 kg Body mass index is 41.05 kg/m.   Vital Signs:   Temp:  [97.7 F (36.5 C)-98.2 F (36.8 C)] 98.2 F (36.8 C) (07/15 0445) Pulse Rate:  [74-90] 83 (07/15 0445) Resp:  [20-33] 20 (07/15 0445) BP: (101-173)/(58-127) 173/127 (07/15 0445) SpO2:  [91 %-99 %] 99 % (07/15 0445) Weight:  [133.5 kg] 133.5 kg (07/15 0445) Last BM Date : 05/01/24  Weight change: Filed Weights   05/01/24 0528 05/02/24 0320 05/03/24 0445  Weight: 134.7 kg 136 kg 133.5 kg   Intake/Output:  Intake/Output Summary (Last 24 hours) at 05/03/2024 0729 Last data filed at 05/03/2024 0454 Gross per 24 hour  Intake 720 ml  Output 7410 ml  Net -6690 ml    Physical Exam    General: Well appearing. No distress on RA Cardiac: JVP difficult to assess d/t thich neck. S1 and S2 present. No murmurs or rub. Resp: Diminished throughout Extremities: Warm and dry.  1+ BLE edema.  Neuro: Alert and oriented x3. Affect pleasant. Moves all extremities without difficulty.  Telemetry   SR 70s (personally reviewed)  EKG    No new EKG to review  Labs    CBC No results for input(s): WBC, NEUTROABS, HGB, HCT, MCV, PLT in the last 72 hours.  Basic Metabolic Panel Recent Labs    92/86/74 0229 05/02/24 0248  NA 137 140  K 3.8 4.1  CL 107 108  CO2 22 24  GLUCOSE 135* 169*  BUN 23* 23*  CREATININE 1.27* 1.42*  CALCIUM 8.6* 8.6*  MG 2.0  --    BNP (last 3 results) Recent Labs    04/27/24 1100  BNP 1,356.9*   Imaging   No results found.  Medications:    Scheduled  Medications:  carvedilol   6.25 mg Oral BID WC   ferrous sulfate   325 mg Oral Q breakfast   losartan   50 mg Oral Daily   rivaroxaban   10 mg Oral Daily   senna-docusate  2 tablet Oral QHS   spironolactone   25 mg Oral Daily   Infusions:  PRN Medications: acetaminophen , mouth rinse, prochlorperazine   Patient Profile   48 y/o male w/ h/o untreated/uncontrolled HTN, diastolic heart failure, poor compliance and substance use, admitted w/ new systolic heart failure.   Assessment/Plan   1. Acute Systolic Heart Failure - Suspect most likely HTN CM but will need to r/o infiltrative process. Plan cMRI after diuresis. Doubt ischemic but if EF does not improve in 3 months w/ good GDMT/ BP control, will need LHC. - Echo 2018 EF 60-65%, severe LVH w/ GIDD, RV ok - Echo this admit, EF now 30-35%, mod LVH, RV mod reduced  - NYHA IIIb on admit - Improving w/ diuresis but remains symptomatic and volume overloaded - Continue diuresis, Lasix  80 mg IV bid - place TED hoses for edema - increase losartan  to 100 mg today, may need BID dosing since intolerant to nitrates/hydral d/t HA - no Entresto given angioedema w/ ACE-I  - continue spironolactone  25 mg daily  - continue coreg  6.25 mg bid   - start Jardiance   10 mg daily, patient assistance in process - imperative to quit cocaine  - plan for MRI this evening    2. Hypertension  - remains elevated - HF GDMT titration per above  - consider RA dopplers - uninsured. If able to get insurance can consider outpatient sleep study    3. Polysubstance use - advised to quit cocaine and THC    4. IDA: tsat 11, ferritin 53 - ? If 2/2 hemorrhoidal bleeding  - will wait to give IV Fe until after completion of cMRI  - will need PCP and outpatient w/u    Length of Stay: 6  Jeremy Nahmir Zeidman, NP  05/03/2024, 7:29 AM  Advanced Heart Failure Team Pager [phone] (M-F; 7a - 5p)  Please contact [facility] for night-coverage after hours (5p -7a ) and weekends on  [email or web address]

## 2024-05-03 NOTE — Plan of Care (Signed)

## 2024-05-03 NOTE — Progress Notes (Addendum)
 HD#6 SUBJECTIVE:  Patient Summary: Jeremy Hood is a 48 y.o. with a pertinent PMH of HTN, CKD, cocaine use, multifocal cavitary lung disease, new diagnosed CHF (HFrEF, EF 30-35%), who presented with breathing difficulty, especially at night, and admitted for newly diagnosed CHF. He had noticed breathing trouble continuing for the last few months, worse when he sleeps, and shortness of breath when he exerts himself at work. He was previously prescribed amlodipine  10 mg but had  not been taking this.   Overnight Events:  Patient had overnight hypertension (173/105, 173/127, 141/96,146/106 with O2 98-99% on RA).   Interim History: Patient has not had headaches since stopping hydralazine  and imdur . He is able to lie flat now and has had improved SOB. He did not sleep well due to being woken up multiple times by staff to check in on him.    OBJECTIVE:  Vital Signs: Vitals:   05/03/24 0445 05/03/24 0730 05/03/24 0750 05/03/24 1125  BP: (!) 173/127 (!) 175/107 (!) 175/107 (!) 155/104  Pulse: 83     Resp: 20 (!) 22  20  Temp: 98.2 F (36.8 C) 98.2 F (36.8 C)  98.2 F (36.8 C)  TempSrc: Oral Oral  Oral  SpO2: 99% 98%  97%  Weight: 133.5 kg     Height:       Supplemental O2: Room Air SpO2: 97 % O2 Flow Rate (L/min): 3 L/min FiO2 (%): 21 %  Filed Weights   05/01/24 0528 05/02/24 0320 05/03/24 0445  Weight: 134.7 kg 136 kg 133.5 kg     Intake/Output Summary (Last 24 hours) at 05/03/2024 1532 Last data filed at 05/03/2024 1128 Gross per 24 hour  Intake 840 ml  Output 5350 ml  Net -4510 ml   Net IO Since Admission: -11,789.5 mL [05/03/24 1532]  Physical Exam: Physical Exam Const: Awake, alert in NAD. Lying down on bed on his right side. Appears comfortable HENT: Normocephalic, atraumatic Card: RRR no m/r/g Resp: CTAB. No increased WOB Extremities: 1+ pitting edema on RLE Patient Lines/Drains/Airways Status     Active Line/Drains/Airways     Name Placement date  Placement time Site Days   Peripheral IV 04/27/24 20 G Anterior;Right Forearm 04/27/24  1148  Forearm  1   Wound / Incision (Open or Dehisced) 12/25/15 Coccyx I and d abscess 12/25/15  1538  Coccyx  3047            Pertinent labs and imaging:     Latest Ref Rng & Units 04/30/2024    2:20 AM 04/29/2024    3:21 AM 04/28/2024    3:59 AM  CBC  WBC 4.0 - 10.5 K/uL 10.9  10.5  7.3   Hemoglobin 13.0 - 17.0 g/dL 89.4  89.1  89.1   Hematocrit 39.0 - 52.0 % 33.5  33.9  34.1   Platelets 150 - 400 K/uL 296  286  266        Latest Ref Rng & Units 05/03/2024    7:08 AM 05/02/2024    2:48 AM 05/01/2024    2:29 AM  CMP  Glucose 70 - 99 mg/dL 871  830  864   BUN 6 - 20 mg/dL 26  23  23    Creatinine 0.61 - 1.24 mg/dL 8.58  8.57  8.72   Sodium 135 - 145 mmol/L 139  140  137   Potassium 3.5 - 5.1 mmol/L 3.8  4.1  3.8   Chloride 98 - 111 mmol/L 102  108  107  CO2 22 - 32 mmol/L 25  24  22    Calcium 8.9 - 10.3 mg/dL 9.0  8.6  8.6     No results found.  ASSESSMENT/PLAN:  Assessment: Principal Problem:   Acute systolic heart failure (HCC) Active Problems:   Uncontrolled hypertension   Tobacco use   Cocaine use disorder (HCC)   Paroxysmal nocturnal dyspnea   Stage 3b chronic kidney disease (HCC)   HFrEF (heart failure with reduced ejection fraction) (HCC)   Plan: #HFrEF newly diagnosed with acute exacerbation ECHO done confirming HFrEF with EF 30-35%. The LV has moderately decreased function with gloal hypokinesis. LV cavity shows moderate dilation. RV systolic function is moderately reduced and moderately enlarged. Dilation of the LA and RA are appreciated. Moderate mitral valve regurgitation is appreciated. The aortic valve is tricuspid with mild calcification appreciated.    Cardio was consulted morning 7/15 and opted to change to irbesartan  300 mg every day and with still discontinuing hydralazine  and Imdur . Also opted to keep lasix  dosage at 80mg . Since Mr. Selman is comfortable  lying down, can proceed with CMRI.  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 -lasix  80 mg IV BID -spironolactone  25 mg daily -coreg  6.25mg  BID -jardiance  10mg  QD -irbesartan  300 mg daily (no adverse rxn to 25mg , previous angioedema with lisinopril ) -take daily standing weights -AM BMP -KCL 40 mEq -pending cardiac MRI  #Headache -resolved Essential hypertension Patient continued to have hypertension (173/105, 173/127, 141/96,146/106) overnight. Hydralazine  and imdur  discontinued per cardio recommendation due to continued hypertension. Cardio opting to look into secondary causes of hypertension, such as RAS. Can also consider TSH labs today.  -Renal duplex US  -TSH   #Paroxysmal nocturnal dyspnea Patient has choking, gasping, and snoring during sleep and must sleep on multiple pillows. Might be a component of OSA. Patient has attempted to use CPAP mask during this admission but unable to tolerate -monitor for improvement post diuresis -Discussed outpatient referral for sleep study and intranasal CPAP for greater comfort if necessary  #Polyuria Patient noted nocturia at admission. Ddx: Heart failure, BPH, Diabetes, and UTI. UA positive for protein, no bacteria. PSA WNL. Polyuria at this stage could be attributed to lasix  or BNP elevation or a combination. -monitor for improvement of polyuria after diuresis  #Anemia Patient CBC revealed hgb around 10. TIBC normal, iron  sat 11, suggests iron  deficiency anemia. Recommending outpatient oral/IV iron .  -continue PO iron  during admission.   #AKI Baseline Creatinine ~1.2 Creatinine 7/12 increased to 1.83. Held lasix  on 7/12 Creatinine 7/13 1.27. Resumed lasix  on 7/13.  Creatinine 7/14 1.42, likely secondary to losartan  addition. Creatinine 7/15 1.41, stable after increased losartan  dosage  Best Practice: Diet: Regular diet IVF: Fluids: none, Rate: None VTE: Place TED hose Start: 05/02/24 1139 rivaroxaban  (XARELTO )  tablet 10 mg Start: 04/27/24 1345 Code: Full  Disposition planning: Therapy Recs: Pending, DME: none Family Contact: Verneita Jona, to be notified. DISPO: Anticipated discharge pending to Home pending improving clinical status.  Signature:  Brad Prey MS3  3:32 PM, 05/03/2024    I have personally seen and examined the patient and agree with the following evaluation and plan of care for this patient.  -Sallyanne Primas, DO [facility]   Attestation for Student Documentation:  I personally was present and re-performed the history, physical exam and medical decision-making activities of this service and have verified that the service and findings are accurately documented in the student's note.  Agree with changes including transition to irbesartan  as better for  BP control.  Patient is feeling better after diuresis and hopefully will be able to obtain CMRI tomorrow. Rosan Dayton BROCKS, DO 05/03/2024, 7:50 PM

## 2024-05-04 ENCOUNTER — Other Ambulatory Visit (HOSPITAL_COMMUNITY): Payer: Self-pay

## 2024-05-04 ENCOUNTER — Inpatient Hospital Stay (HOSPITAL_COMMUNITY): Payer: Self-pay

## 2024-05-04 LAB — CBC
HCT: 36.5 % — ABNORMAL LOW (ref 39.0–52.0)
Hemoglobin: 11.5 g/dL — ABNORMAL LOW (ref 13.0–17.0)
MCH: 27.3 pg (ref 26.0–34.0)
MCHC: 31.5 g/dL (ref 30.0–36.0)
MCV: 86.7 fL (ref 80.0–100.0)
Platelets: 382 K/uL (ref 150–400)
RBC: 4.21 MIL/uL — ABNORMAL LOW (ref 4.22–5.81)
RDW: 15.4 % (ref 11.5–15.5)
WBC: 7.9 K/uL (ref 4.0–10.5)
nRBC: 0 % (ref 0.0–0.2)

## 2024-05-04 LAB — BASIC METABOLIC PANEL WITH GFR
Anion gap: 12 (ref 5–15)
BUN: 24 mg/dL — ABNORMAL HIGH (ref 6–20)
CO2: 26 mmol/L (ref 22–32)
Calcium: 9.2 mg/dL (ref 8.9–10.3)
Chloride: 103 mmol/L (ref 98–111)
Creatinine, Ser: 1.35 mg/dL — ABNORMAL HIGH (ref 0.61–1.24)
GFR, Estimated: >60 mL/min (ref >60)
Glucose, Bld: 107 mg/dL — ABNORMAL HIGH (ref 70–99)
Potassium: 4.4 mmol/L (ref 3.5–5.1)
Sodium: 141 mmol/L (ref 135–145)

## 2024-05-04 LAB — TSH: TSH: 2.28 u[IU]/mL (ref 0.350–4.500)

## 2024-05-04 LAB — MAGNESIUM: Magnesium: 2.4 mg/dL (ref 1.7–2.4)

## 2024-05-04 MED ORDER — SODIUM CHLORIDE 0.9 % IV SOLN
300.0000 mg | INTRAVENOUS | Status: AC
  Administered 2024-05-04 – 2024-05-05 (×2): 300 mg via INTRAVENOUS
  Filled 2024-05-04 (×2): qty 15

## 2024-05-04 MED ORDER — FUROSEMIDE 10 MG/ML IJ SOLN
80.0000 mg | Freq: Once | INTRAMUSCULAR | Status: AC
  Administered 2024-05-04: 80 mg via INTRAVENOUS
  Filled 2024-05-04: qty 8

## 2024-05-04 MED ORDER — CARVEDILOL 12.5 MG PO TABS
12.5000 mg | ORAL_TABLET | Freq: Two times a day (BID) | ORAL | Status: DC
  Administered 2024-05-04 – 2024-05-06 (×4): 12.5 mg via ORAL
  Filled 2024-05-04 (×4): qty 1

## 2024-05-04 MED ORDER — FUROSEMIDE 10 MG/ML IJ SOLN
80.0000 mg | Freq: Once | INTRAMUSCULAR | Status: AC
Start: 2024-05-04 — End: 2024-05-04
  Administered 2024-05-04: 80 mg via INTRAVENOUS
  Filled 2024-05-04: qty 8

## 2024-05-04 MED ORDER — TRIMETHOBENZAMIDE HCL 100 MG/ML IM SOLN
200.0000 mg | Freq: Three times a day (TID) | INTRAMUSCULAR | Status: DC | PRN
  Filled 2024-05-04: qty 2

## 2024-05-04 MED ORDER — IRON SUCROSE 300 MG IVPB - SIMPLE MED
300.0000 mg | Status: DC
  Filled 2024-05-04: qty 265

## 2024-05-04 MED ORDER — IRBESARTAN 150 MG PO TABS
300.0000 mg | ORAL_TABLET | Freq: Every day | ORAL | Status: DC
  Administered 2024-05-04 – 2024-05-06 (×3): 300 mg via ORAL
  Filled 2024-05-04 (×3): qty 2

## 2024-05-04 NOTE — Progress Notes (Signed)
 HD#7 SUBJECTIVE:  Patient Summary: Jeremy Hood is a 48 y.o. with a pertinent PMH of HTN, CKD, cocaine use, multifocal cavitary lung disease, new diagnosed CHF (HFrEF, EF 30-35%), who presented with breathing difficulty, especially at night, and admitted for newly diagnosed CHF. He had noticed breathing trouble continuing for the last few months, worse when he sleeps, and shortness of breath when he exerts himself at work. He was previously prescribed amlodipine  10 mg but had  not been taking this.   Overnight Events:  Patient had overnight hypertension (172/123,163/91,144/87 with 97% on RA).   Interim History:  He is able to lie flat now and has had improved SOB.     OBJECTIVE:  Vital Signs: Vitals:   05/04/24 0240 05/04/24 0500 05/04/24 0736 05/04/24 1104  BP: (!) 172/123  (!) 161/91 (!) 144/87  Pulse: 77     Resp: 20 (!) 22 (!) 37 16  Temp: 98.1 F (36.7 C)  98.2 F (36.8 C) 98.2 F (36.8 C)  TempSrc: Oral  Oral Oral  SpO2:   97% 97%  Weight:  133.1 kg    Height:       Supplemental O2: Room Air SpO2: 97 % O2 Flow Rate (L/min): 3 L/min FiO2 (%): 21 %  Filed Weights   05/02/24 0320 05/03/24 0445 05/04/24 0500  Weight: 136 kg 133.5 kg 133.1 kg     Intake/Output Summary (Last 24 hours) at 05/04/2024 1350 Last data filed at 05/04/2024 1105 Gross per 24 hour  Intake 1800 ml  Output 5400 ml  Net -3600 ml   Net IO Since Admission: -15,149.5 mL [05/04/24 1350]  Physical Exam: Physical Exam Const: Awake, alert in NAD. Lying down on bed on his right side asleep. Appears comfortable.  HENT: Normocephalic, atraumatic Card: RRR no m/r/g Resp: CTAB. No increased WOB Extremities: 1+ pitting edema on RLE and LLE. Focused 2+ bilateral pitting edema of feet.  Patient Lines/Drains/Airways Status     Active Line/Drains/Airways     Name Placement date Placement time Site Days   Peripheral IV 04/27/24 20 G Anterior;Right Forearm 04/27/24  1148  Forearm  1   Wound /  Incision (Open or Dehisced) 12/25/15 Coccyx I and d abscess 12/25/15  1538  Coccyx  3047            Pertinent labs and imaging:  TSH WNL    Latest Ref Rng & Units 05/04/2024    2:37 AM 04/30/2024    2:20 AM 04/29/2024    3:21 AM  CBC  WBC 4.0 - 10.5 K/uL 7.9  10.9  10.5   Hemoglobin 13.0 - 17.0 g/dL 88.4  89.4  89.1   Hematocrit 39.0 - 52.0 % 36.5  33.5  33.9   Platelets 150 - 400 K/uL 382  296  286        Latest Ref Rng & Units 05/04/2024    2:37 AM 05/03/2024    7:08 AM 05/02/2024    2:48 AM  CMP  Glucose 70 - 99 mg/dL 892  871  830   BUN 6 - 20 mg/dL 24  26  23    Creatinine 0.61 - 1.24 mg/dL 8.64  8.58  8.57   Sodium 135 - 145 mmol/L 141  139  140   Potassium 3.5 - 5.1 mmol/L 4.4  3.8  4.1   Chloride 98 - 111 mmol/L 103  102  108   CO2 22 - 32 mmol/L 26  25  24    Calcium 8.9 -  10.3 mg/dL 9.2  9.0  8.6     MR CARDIAC VELOCITY FLOW MAP Result Date: 05/04/2024 CLINICAL DATA:  Cardiomyopathy of uncertain etiology EXAM: CARDIAC MRI TECHNIQUE: The patient was scanned on a 1.5 Tesla GE magnet. A dedicated cardiac coil was used. Functional imaging was done using Fiesta sequences. 2,3, and 4 chamber views were done to assess for RWMA's. Modified Simpson's rule using a short axis stack was used to calculate an ejection fraction on a dedicated work Research officer, [employer]. The patient received 10 cc of Gadavist . After 10 minutes inversion recovery sequences were used to assess for infiltration and scar tissue. FINDINGS: Limited images of the lung fields showed no gross abnormalities. Mildly dilated left ventricle with moderate asymmetric LV hypertrophy involving the basal to mid septum. Global hypokinesis, LV EF 26%. No LV thrombus. Normal right ventricular size and systolic function, RV EF 51%. Moderate left atrial enlargement, normal right atrium. Trileaflet aortic valve, no stenosis and trivial aortic insufficiency (regurgitant fraction 7%). Mild mitral regurgitation with  regurgitant fraction 15%. On delayed enhancement imaging, there was diffuse mid-wall late gadolinium enhancement (LGE) throughout the basal to mid anteroseptum and inferoseptum. Mid-wall LGE in the basal inferolateral wall. There was subepicardial LGE in the mid inferior and mid inferolateral walls. MEASUREMENTS: MEASUREMENTS LVEDV 377 mL LVEDVi 109 mL/m2 LVSV 96 mL LVEF 26% RVEDV 191 mL RVEDVi 74 mL/m2 RVSV 96 mL RVEF 51% Aortic forward volume 81 mL Aortic regurgitant fraction 7% Global T1 1152, ECV 36% Global T2 53 (within normal limits) IMPRESSION: 1. Mildly dilated left ventricle with moderate asymmetric LV hypertrophy primarily involving the basal to mid septum. No mitral valve systolic anterior motion or turbulence to sugest LVOT gradient. LV EF 26%, diffuse hypokinesis. 2.  Normal RV size and systolic function, RV EF 51%. 3. Diffuse mid-wall LGE throughout the basal to mid septum, mid-wall LGE in the basal inferolateral wall, subepicardial LGE in the mid inferior and inferolateral walls. This is not a coronary disease pattern. This could be seen with hypertrophic cardiomyopathy, but also could be representative of a genetic (arrhythmogenic) cardiomyopathy or infiltrative disease. LGE is not as bright and focal as is often seen with sarcoidosis but hard to rule this out. Would suggest genetic testing for cardiomyopathies and consider cardiac PET to assess for cardiac sarcoidosis. 4. Elevated extracellular volume percentage at 36% suggesting increased myocardial fibrotic content. 5. Normal global T2 value suggests that acute/active myocarditis is unlikely. Dalton Mclean Electronically Signed   By: Ezra Shuck M.D.   On: 05/04/2024 13:44   MR CARDIAC VELOCITY FLOW MAP Result Date: 05/04/2024 CLINICAL DATA:  Cardiomyopathy of uncertain etiology EXAM: CARDIAC MRI TECHNIQUE: The patient was scanned on a 1.5 Tesla GE magnet. A dedicated cardiac coil was used. Functional imaging was done using Fiesta sequences.  2,3, and 4 chamber views were done to assess for RWMA's. Modified Simpson's rule using a short axis stack was used to calculate an ejection fraction on a dedicated work [employer] officer, [employer]. The patient received 10 cc of Gadavist . After 10 minutes inversion recovery sequences were used to assess for infiltration and scar tissue. FINDINGS: Limited images of the lung fields showed no gross abnormalities. Mildly dilated left ventricle with moderate asymmetric LV hypertrophy involving the basal to mid septum. Global hypokinesis, LV EF 26%. No LV thrombus. Normal right ventricular size and systolic function, RV EF 51%. Moderate left atrial enlargement, normal right atrium. Trileaflet aortic valve, no stenosis and trivial aortic insufficiency (regurgitant fraction 7%). Mild  mitral regurgitation with regurgitant fraction 15%. On delayed enhancement imaging, there was diffuse mid-wall late gadolinium enhancement (LGE) throughout the basal to mid anteroseptum and inferoseptum. Mid-wall LGE in the basal inferolateral wall. There was subepicardial LGE in the mid inferior and mid inferolateral walls. MEASUREMENTS: MEASUREMENTS LVEDV 377 mL LVEDVi 109 mL/m2 LVSV 96 mL LVEF 26% RVEDV 191 mL RVEDVi 74 mL/m2 RVSV 96 mL RVEF 51% Aortic forward volume 81 mL Aortic regurgitant fraction 7% Global T1 1152, ECV 36% Global T2 53 (within normal limits) IMPRESSION: 1. Mildly dilated left ventricle with moderate asymmetric LV hypertrophy primarily involving the basal to mid septum. No mitral valve systolic anterior motion or turbulence to sugest LVOT gradient. LV EF 26%, diffuse hypokinesis. 2.  Normal RV size and systolic function, RV EF 51%. 3. Diffuse mid-wall LGE throughout the basal to mid septum, mid-wall LGE in the basal inferolateral wall, subepicardial LGE in the mid inferior and inferolateral walls. This is not a coronary disease pattern. This could be seen with hypertrophic cardiomyopathy, but also could be representative  of a genetic (arrhythmogenic) cardiomyopathy or infiltrative disease. LGE is not as bright and focal as is often seen with sarcoidosis but hard to rule this out. Would suggest genetic testing for cardiomyopathies and consider cardiac PET to assess for cardiac sarcoidosis. 4. Elevated extracellular volume percentage at 36% suggesting increased myocardial fibrotic content. 5. Normal global T2 value suggests that acute/active myocarditis is unlikely. Dalton Mclean Electronically Signed   By: Ezra Shuck M.D.   On: 05/04/2024 13:43   MR CARDIAC MORPHOLOGY W WO CONTRAST Result Date: 05/04/2024 CLINICAL DATA:  Cardiomyopathy of uncertain etiology EXAM: CARDIAC MRI TECHNIQUE: The patient was scanned on a 1.5 Tesla GE magnet. A dedicated cardiac coil was used. Functional imaging was done using Fiesta sequences. 2,3, and 4 chamber views were done to assess for RWMA's. Modified Simpson's rule using a short axis stack was used to calculate an ejection fraction on a dedicated work [employer] officer, [employer]. The patient received 10 cc of Gadavist . After 10 minutes inversion recovery sequences were used to assess for infiltration and scar tissue. FINDINGS: Limited images of the lung fields showed no gross abnormalities. Mildly dilated left ventricle with moderate asymmetric LV hypertrophy involving the basal to mid septum. Global hypokinesis, LV EF 26%. No LV thrombus. Normal right ventricular size and systolic function, RV EF 51%. Moderate left atrial enlargement, normal right atrium. Trileaflet aortic valve, no stenosis and trivial aortic insufficiency (regurgitant fraction 7%). Mild mitral regurgitation with regurgitant fraction 15%. On delayed enhancement imaging, there was diffuse mid-wall late gadolinium enhancement (LGE) throughout the basal to mid anteroseptum and inferoseptum. Mid-wall LGE in the basal inferolateral wall. There was subepicardial LGE in the mid inferior and mid inferolateral walls. MEASUREMENTS:  MEASUREMENTS LVEDV 377 mL LVEDVi 109 mL/m2 LVSV 96 mL LVEF 26% RVEDV 191 mL RVEDVi 74 mL/m2 RVSV 96 mL RVEF 51% Aortic forward volume 81 mL Aortic regurgitant fraction 7% Global T1 1152, ECV 36% Global T2 53 (within normal limits) IMPRESSION: 1. Mildly dilated left ventricle with moderate asymmetric LV hypertrophy primarily involving the basal to mid septum. No mitral valve systolic anterior motion or turbulence to sugest LVOT gradient. LV EF 26%, diffuse hypokinesis. 2.  Normal RV size and systolic function, RV EF 51%. 3. Diffuse mid-wall LGE throughout the basal to mid septum, mid-wall LGE in the basal inferolateral wall, subepicardial LGE in the mid inferior and inferolateral walls. This is not a coronary disease pattern. This could be seen  with hypertrophic cardiomyopathy, but also could be representative of a genetic (arrhythmogenic) cardiomyopathy or infiltrative disease. LGE is not as bright and focal as is often seen with sarcoidosis but hard to rule this out. Would suggest genetic testing for cardiomyopathies and consider cardiac PET to assess for cardiac sarcoidosis. 4. Elevated extracellular volume percentage at 36% suggesting increased myocardial fibrotic content. 5. Normal global T2 value suggests that acute/active myocarditis is unlikely. Dalton Mclean Electronically Signed   By: Ezra Shuck M.D.   On: 05/04/2024 13:43    ASSESSMENT/PLAN:  Assessment: Principal Problem:   Acute systolic heart failure (HCC) Active Problems:   Uncontrolled hypertension   Tobacco use   Cocaine use disorder (HCC)   Paroxysmal nocturnal dyspnea   Stage 3b chronic kidney disease (HCC)   HFrEF (heart failure with reduced ejection fraction) (HCC)   Plan: #HFrEF newly diagnosed with acute exacerbation ECHO done confirming HFrEF with EF 30-35%. The LV has moderately decreased function with gloal hypokinesis. LV cavity shows moderate dilation. RV systolic function is moderately reduced and moderately  enlarged. Dilation of the LA and RA are appreciated. Moderate mitral valve regurgitation is appreciated. The aortic valve is tricuspid with mild calcification appreciated.    CMRI suggests mildly dilated LV with moderate asymmetric LV hypertrophy primarily involving the basal to mid septum. LV EF 26% with diffuse hypokinesis. Normal RV size and systolic function with RV EF 51%. Diffuse mid-wall LGE throughout the basal to mid septum, mid-wall LGE in basal inferolateral wall, subepicardial LGE in the mid inferior and inferolateral walls. This is not a coronary disease pattern. Hypertrophic cardiomyopathy vs genetic (arrhythmogenic) cardiomyopathy vs infiltrative disease. LGE not as bright and focal as is often seen with sarcoidosis but cannot be ruled out.   Patient able to lie flat comfortably today. Cardio saw patient morning of 7/16 and opted to continue to irbesartan  300 mg and rest of current regimen but with increased coreg .   -lasix  80 mg IV once 7/16 -spironolactone  25 mg daily -coreg  12.5mg  BID -jardiance  10mg  QD -irbesartan  300 mg daily (no adverse rxn to 25mg  losartan , previous angioedema with lisinopril ) -PO torsemide  starting 7/17 -take daily standing weights -AM BMP -monitor for cardio suggestion of genetic testing and/or cardiac PET for cardiac sarcoidosis workup.   #Headache -resolved Essential hypertension Patient continued to have hypertension (172/123,163/91,144/87) overnight.Cardio opting to look into secondary causes of hypertension, such as RAS. TSH WNL.  -Renal duplex US  tomorrow  #Paroxysmal nocturnal dyspnea Patient now able to lie flat, but has daytime somnolence and snoring. Might be a component of OSA. Patient has attempted to use CPAP mask during this admission but unable to tolerate -monitor for improvement post diuresis -Discussed outpatient referral for sleep study and intranasal CPAP for greater comfort if necessary  #Polyuria Patient noted nocturia at  admission. Ddx: Heart failure, BPH, Diabetes, and UTI. UA positive for protein, no bacteria. PSA WNL. Polyuria at this stage could be attributed to lasix  or BNP elevation or a combination. -monitor for improvement of polyuria after diuresis  #Anemia Patient CBC revealed hgb around 11.5. TIBC normal, iron  sat 11, suggests iron  deficiency anemia. Recommending outpatient oral/IV iron .  -start IV iron .   #AKI Baseline Creatinine ~1.2 Creatinine 7/12 increased to 1.83. Held lasix  on 7/12 Creatinine 7/13 1.27. Resumed lasix  on 7/13.  Creatinine 7/14 1.42, likely secondary to losartan  addition. Creatinine 7/15 1.41, stable after increased losartan  dosage Creatinine 7/16 1.35, improving Best Practice: Diet: Cardiac diet VTE: Place TED hose Start: 05/02/24 1139 rivaroxaban  (XARELTO )  tablet 10 mg Start: 04/27/24 1345 Code: Full  Disposition planning: Family Contact: Verneita Claude, to be notified. DISPO: Anticipated discharge pending to Home pending improving clinical status.  Signature:  Brad Prey MS3  1:50 PM, 05/04/2024    Daneshvar, Nina, Medical Student 05/04/2024, 1:50 PM  I have personally seen and examined the patient and agree with the following evaluation and plan of care for this patient.  -Sallyanne Primas, DO [facility]

## 2024-05-04 NOTE — Progress Notes (Addendum)
 Advanced Heart Failure Rounding Note  Cardiologist: None  Chief Complaint: Acute systolic heart failure Subjective:    CMR read pending. BP remains 170/120s.  4.7L UOP (net -2.6L). Weight down 1 lb. sCr stable 1.41>1.35.   Lying in bed. Feeling better. Tired.   Objective:    Weight Range: 133.1 kg Body mass index is 40.92 kg/m.   Vital Signs:   Temp:  [98 F (36.7 C)-98.2 F (36.8 C)] 98.1 F (36.7 C) (07/16 0240) Pulse Rate:  [77-83] 77 (07/16 0240) Resp:  [18-35] 22 (07/16 0500) BP: (145-175)/(76-123) 172/123 (07/16 0240) SpO2:  [97 %-98 %] 98 % (07/15 2004) Weight:  [133.1 kg] 133.1 kg (07/16 0500) Last BM Date : 05/03/24  Weight change: Filed Weights   05/02/24 0320 05/03/24 0445 05/04/24 0500  Weight: 136 kg 133.5 kg 133.1 kg   Intake/Output:  Intake/Output Summary (Last 24 hours) at 05/04/2024 0655 Last data filed at 05/04/2024 0454 Gross per 24 hour  Intake 2040 ml  Output 2900 ml  Net -860 ml    Physical Exam    General: Well appearing. No distress on RA Cardiac: JVP ~8cm. S1 and S2 present. No murmurs or rub. Resp: Lung sounds clear and equal B/L Extremities: Warm and dry. 1+ BLE edema.  Neuro: Alert and oriented x3. Affect pleasant. Moves all extremities without difficulty.  Telemetry   SR in 70s (personally reviewed)  EKG    No new EKG to review  Labs    CBC Recent Labs    05/04/24 0237  WBC 7.9  HGB 11.5*  HCT 36.5*  MCV 86.7  PLT 382   Basic Metabolic Panel Recent Labs    92/84/74 0708 05/04/24 0237  NA 139 141  K 3.8 4.4  CL 102 103  CO2 25 26  GLUCOSE 128* 107*  BUN 26* 24*  CREATININE 1.41* 1.35*  CALCIUM 9.0 9.2  MG 1.9 2.4   BNP (last 3 results) Recent Labs    04/27/24 1100  BNP 1,356.9*   Imaging   No results found.  Medications:    Scheduled Medications:  carvedilol   6.25 mg Oral BID WC   empagliflozin   10 mg Oral Daily   ferrous sulfate   325 mg Oral Q breakfast   irbesartan   300 mg Oral  Daily   rivaroxaban   10 mg Oral Daily   senna-docusate  2 tablet Oral QHS   spironolactone   25 mg Oral Daily   Infusions:  PRN Medications: acetaminophen , mouth rinse, prochlorperazine   Patient Profile   48 y/o male w/ h/o untreated/uncontrolled HTN, diastolic heart failure, poor compliance and substance use, admitted w/ new systolic heart failure.   Assessment/Plan   1. Acute Systolic Heart Failure - Suspect most likely HTN CM but will need to r/o infiltrative process. Plan cMRI after diuresis. Doubt ischemic but if EF does not improve in 3 months w/ good GDMT/ BP control, will need LHC. - Echo 2018 EF 60-65%, severe LVH w/ GIDD, RV ok - Echo this admit, EF now 30-35%, mod LVH, RV mod reduced  - NYHA IIIb on admit. Improving w/ diuresis. Remains slightly volume up. - continue Lasix  80 mg IV bid, start PO torsemide  tomorrow. Cr stable 1.41>1.35 - start irebasartan 300 mg daily today since intolerant to nitrates/hydral d/t HA - no Entresto given angioedema w/ ACE-I  - continue spironolactone  25 mg daily  - increase coreg  to 12.5 mg bid tonight - continue Jardiance  10 mg daily, patient assistance in process - imperative to  quit cocaine  - CMR pending read   2. Hypertension  - remains elevated - HF GDMT titration per above  - consider RA dopplers - uninsured. If able to get insurance can consider outpatient sleep study    3. Polysubstance use - advised to quit cocaine and THC    4. IDA: tsat 11, ferritin 53 - ? If 2/2 hemorrhoidal bleeding  - give IV Fe today - will need PCP and outpatient w/u   VTE PPX: Xarelto  per primary   Length of Stay: 7  [place] Abrie Egloff, NP  05/04/2024, 6:55 AM  Advanced Heart Failure Team Pager [phone] (M-F; 7a - 5p)  Please contact [facility] for night-coverage after hours (5p -7a ) and weekends on [email or web address]

## 2024-05-04 NOTE — Plan of Care (Signed)

## 2024-05-05 ENCOUNTER — Encounter (HOSPITAL_COMMUNITY): Payer: Self-pay

## 2024-05-05 ENCOUNTER — Inpatient Hospital Stay (HOSPITAL_COMMUNITY): Payer: Self-pay

## 2024-05-05 DIAGNOSIS — I10 Essential (primary) hypertension: Secondary | ICD-10-CM

## 2024-05-05 LAB — BASIC METABOLIC PANEL WITH GFR
Anion gap: 11 (ref 5–15)
BUN: 32 mg/dL — ABNORMAL HIGH (ref 6–20)
CO2: 27 mmol/L (ref 22–32)
Calcium: 9.1 mg/dL (ref 8.9–10.3)
Chloride: 101 mmol/L (ref 98–111)
Creatinine, Ser: 1.57 mg/dL — ABNORMAL HIGH (ref 0.61–1.24)
GFR, Estimated: 54 mL/min — ABNORMAL LOW (ref >60)
Glucose, Bld: 104 mg/dL — ABNORMAL HIGH (ref 70–99)
Potassium: 4.1 mmol/L (ref 3.5–5.1)
Sodium: 139 mmol/L (ref 135–145)

## 2024-05-05 MED ORDER — TORSEMIDE 20 MG PO TABS
40.0000 mg | ORAL_TABLET | Freq: Every day | ORAL | Status: DC
  Administered 2024-05-05 – 2024-05-06 (×2): 40 mg via ORAL
  Filled 2024-05-05 (×2): qty 2

## 2024-05-05 NOTE — Progress Notes (Signed)
 Advanced Heart Failure Rounding Note  HF Cardiologist: Dr. Zenaida  Chief Complaint: Acute systolic heart failure  Subjective:    BP tends to be better controlled during day, creeps up during evening hours.  Shortness of breath and orthopnea improving.   Objective:    Weight Range: 132.2 kg Body mass index is 40.64 kg/m.   Vital Signs:   Temp:  [98.1 F (36.7 C)-98.3 F (36.8 C)] 98.1 F (36.7 C) (07/17 1131) Pulse Rate:  [69-86] 73 (07/17 1133) Resp:  [14-30] 20 (07/17 1133) BP: (123-168)/(56-119) 123/57 (07/17 1133) SpO2:  [91 %-98 %] 91 % (07/17 1133) Weight:  [132.2 kg] 132.2 kg (07/17 0612) Last BM Date : 05/04/24  Weight change: Filed Weights   05/03/24 0445 05/04/24 0500 05/05/24 0612  Weight: 133.5 kg 133.1 kg 132.2 kg   Intake/Output:  Intake/Output Summary (Last 24 hours) at 05/05/2024 1333 Last data filed at 05/05/2024 1136 Gross per 24 hour  Intake 730 ml  Output 5700 ml  Net -4970 ml    Physical Exam    General:  Well appearing. Sitting up on side of bed Cor: No JVD. Regular rate & rhythm. No rubs, gallops or murmurs. Lungs: clear Abdomen: obese, soft, nontender, nondistended.  Extremities: trace edema Neuro: alert & orientedx3. Affect pleasant   Telemetry   SR 70s  EKG    No new EKG to review  Labs    CBC Recent Labs    05/04/24 0237  WBC 7.9  HGB 11.5*  HCT 36.5*  MCV 86.7  PLT 382   Basic Metabolic Panel Recent Labs    92/84/74 0708 05/04/24 0237 05/05/24 0324  NA 139 141 139  K 3.8 4.4 4.1  CL 102 103 101  CO2 25 26 27   GLUCOSE 128* 107* 104*  BUN 26* 24* 32*  CREATININE 1.41* 1.35* 1.57*  CALCIUM 9.0 9.2 9.1  MG 1.9 2.4  --    BNP (last 3 results) Recent Labs    04/27/24 1100  BNP 1,356.9*   Imaging   VAS US  RENAL ARTERY DUPLEX Result Date: 05/05/2024 ABDOMINAL VISCERAL Patient Name:  Jeremy Hood  Date of Exam:   05/05/2024 Medical Rec #: [id number]          Accession #:    [id number] Date of  Birth: 1975/10/26          Patient Gender: M Patient Age:   48 years Exam Location:  [facility] Procedure:      VAS US  RENAL ARTERY DUPLEX Referring Phys: [id number] SWAZILAND LEE -------------------------------------------------------------------------------- Indications: Hypertension High Risk Factors: Hypertension, current smoker. Other Factors: CHF, CKD, history of substance abuse. Limitations: Air/bowel gas and obesity. Comparison Study: No previous exams Performing Technologist: Jody Hill RVT, RDMS  Examination Guidelines: A complete evaluation includes B-mode imaging, spectral Doppler, color Doppler, and power Doppler as needed of all accessible portions of each vessel. Bilateral testing is considered an integral part of a complete examination. Limited examinations for reoccurring indications may be performed as noted.  Duplex Findings: +--------------------+--------+--------+------+--------+ Mesenteric          PSV cm/sEDV cm/sPlaqueComments +--------------------+--------+--------+------+--------+ Aorta Prox             71      11                  +--------------------+--------+--------+------+--------+ Celiac Artery Origin  103      24                  +--------------------+--------+--------+------+--------+  SMA Proximal          126      10                  +--------------------+--------+--------+------+--------+    +------------------+--------+--------+--------------+ Right Renal ArteryPSV cm/sEDV cm/s   Comment     +------------------+--------+--------+--------------+ Origin                            not visualized +------------------+--------+--------+--------------+ Proximal             71      24                  +------------------+--------+--------+--------------+ Mid                  73      26                  +------------------+--------+--------+--------------+ Distal               66      18                   +------------------+--------+--------+--------------+ +-----------------+--------+--------+-------+ Left Renal ArteryPSV cm/sEDV cm/sComment +-----------------+--------+--------+-------+ Origin              72      17           +-----------------+--------+--------+-------+ Proximal            80      23           +-----------------+--------+--------+-------+ Mid                 90      22           +-----------------+--------+--------+-------+ Distal              56      20           +-----------------+--------+--------+-------+ +------------+--------+--------+----+-----------+--------+--------+----+ Right KidneyPSV cm/sEDV cm/sRI  Left KidneyPSV cm/sEDV cm/sRI   +------------+--------+--------+----+-----------+--------+--------+----+ Upper Pole                      Upper Pole 19      7       0.65 +------------+--------+--------+----+-----------+--------+--------+----+ Mid         17      6       0.        15      5       0.64 +------------+--------+--------+----+-----------+--------+--------+----+ Lower Pole  23      8       0.64Lower Pole 21      8       0.63 +------------+--------+--------+----+-----------+--------+--------+----+ Hilar       32      13      0.61Hilar      26      8       0.68 +------------+--------+--------+----+-----------+--------+--------+----+ +------------------+-----------------+------------------+--------------+ Right Kidney                       Left Kidney                      +------------------+-----------------+------------------+--------------+ RAR                                RAR                              +------------------+-----------------+------------------+--------------+  RAR (manual)      1.03             RAR (manual)      1.27           +------------------+-----------------+------------------+--------------+ Cortex            14/4 cm/s RI 0.69Cortex            not visualized  +------------------+-----------------+------------------+--------------+ Cortex thickness                   Corex thickness                  +------------------+-----------------+------------------+--------------+ Kidney length (cm)10.83            Kidney length (cm)11.79          +------------------+-----------------+------------------+--------------+   Summary: Renal:  Right: No evidence of right renal artery stenosis. Normal right        Resisitive Index. Normal size right kidney. RRV flow present. Left:  No evidence of left renal artery stenosis. Normal left        Resistive Index. Normal size of left kidney. LRV flow        present.  *See table(s) above for measurements and observations.     Preliminary     Medications:    Scheduled Medications:  carvedilol   12.5 mg Oral BID WC   empagliflozin   10 mg Oral Daily   ferrous sulfate   325 mg Oral Q breakfast   irbesartan   300 mg Oral Daily   rivaroxaban   10 mg Oral Daily   senna-docusate  2 tablet Oral QHS   spironolactone   25 mg Oral Daily   torsemide   40 mg Oral Daily   Infusions:  iron  sucrose (VENOFER ) 300 mg in sodium chloride  0.9 % 250 mL IVPB Stopped (05/04/24 1927)   PRN Medications: acetaminophen , mouth rinse, prochlorperazine , trimethobenzamide   Patient Profile   48 y/o male w/ h/o untreated/uncontrolled HTN, diastolic heart failure, poor compliance and substance use, admitted w/ new systolic heart failure.   Assessment/Plan   1. Acute Systolic Heart Failure - Suspect most likely HTN CM but will need to r/o infiltrative process.  - Echo 2018 EF 60-65%, severe LVH w/ GIDD, RV ok - Echo this admit, EF now 30-35%, mod LVH, RV mod reduced  - cMRI 05/03/24: LVEF 26%, RVEF 51%, LGE pattern could be seen in HCM but could also represent genetic (arrhythmogenic) or infiltrative cardiomyopathy. Cannot exclude cardiac sarcoid. - Unable to obtain cardiac PET for sarcoid workup until he has insurance. Will need genetic  testing. - NYHA IIIb on admit. Improved with diuresis. Start 40 mg Torsemide  daily - on irbesartan  300 mg daily, can give extra 75 mg tonight if BP elevated.  - intolerant to nitrates/hydral d/t HA.  - no Entresto given angioedema w/ ACE-I  - continue spironolactone  25 mg daily  - continue coreg  12.5 mg bid  - continue Jardiance  10 mg daily, patient assistance in process - imperative to quit cocaine    2. Hypertension  - BP improving. May need to add additional meds for PM - HF GDMT titration per above  - No evidence of renal artery stenosis on dopplers - outpatient sleep study once he has insurance   3. Polysubstance use - advised to quit cocaine and THC    4. IDA: tsat 11 - ? If 2/2 hemorrhoidal bleeding  - given IV Fe - will need PCP and outpatient w/u   He is uninsured. Reviewed meds with HF  PharmD. Coreg , spironolactone  and Torsemide  can go under HF fund. Switch irbesartan  to valsartan 320 mg daily at discharge. Patient assistance pending for jardiance .  Likely ready for discharge tomorrow. Will arrange outpatient follow-up.   Length of Stay: 8  Katelynn Heidler N, PA-C  05/05/2024, 1:33 PM  Advanced Heart Failure Team Pager [phone] (M-F; 7a - 5p)  Please contact [facility] for night-coverage after hours (5p -7a ) and weekends on [email or web address]

## 2024-05-05 NOTE — Progress Notes (Signed)
 Renal artery duplex has been completed.   Results can be found under chart review under CV PROC. 05/05/2024 10:25 AM Bush Murdoch RVT, RDMS

## 2024-05-05 NOTE — Plan of Care (Signed)
  Problem: Education: Goal: Knowledge of General Education information will improve Description: Including pain rating scale, medication(s)/side effects and non-pharmacologic comfort measures Outcome: Progressing   Problem: Health Behavior/Discharge Planning: Goal: Ability to manage health-related needs will improve Outcome: Progressing   Problem: Clinical Measurements: Goal: Ability to maintain clinical measurements within normal limits will improve Outcome: Progressing Goal: Will remain free from infection Outcome: Progressing   Problem: Activity: Goal: Risk for activity intolerance will decrease Outcome: Progressing   Problem: Coping: Goal: Level of anxiety will decrease Outcome: Progressing   

## 2024-05-05 NOTE — Progress Notes (Addendum)
 HD#7 SUBJECTIVE:  Patient Summary: Jeremy Hood is a 48 y.o. with a pertinent PMH of HTN, CKD, cocaine use, multifocal cavitary lung disease, new diagnosed CHF (HFrEF, EF 30-35%), who presented with breathing difficulty, especially at night, and admitted for newly diagnosed CHF. He had noticed breathing trouble continuing for the last few months, worse when he sleeps, and shortness of breath when he exerts himself at work. He was previously prescribed amlodipine  10 mg but had  not been taking this.   Overnight Events:  Patient had slightly improved BP (168/119, 160/100, 150/98 with 96% on RA).   Interim History:  He is able to lie flat now and has had improved SOB. He notes intermittent SOB when lying down. He is frustrated by lack of sleep during his stay and the amount of teams coming in his room to check on him.    OBJECTIVE:  Vital Signs: Vitals:   05/05/24 0600 05/05/24 0612 05/05/24 0737 05/05/24 0828  BP:   (!) 168/119 (!) 168/119  Pulse: 80  75 79  Resp: 20  18   Temp:   98.3 F (36.8 C)   TempSrc:   Oral   SpO2: 96%  95%   Weight:  132.2 kg    Height:       Supplemental O2: Room Air SpO2: 95 % O2 Flow Rate (L/min): 3 L/min FiO2 (%): 21 %  Filed Weights   05/03/24 0445 05/04/24 0500 05/05/24 0612  Weight: 133.5 kg 133.1 kg 132.2 kg     Intake/Output Summary (Last 24 hours) at 05/05/2024 1056 Last data filed at 05/05/2024 0700 Gross per 24 hour  Intake 730 ml  Output 2000 ml  Net -1270 ml   Net IO Since Admission: -16,119.5 mL [05/05/24 1056]  Physical Exam: Physical Exam Const: Awake, alert in NAD. Lying down on bed on his right side asleep. Appears comfortable.  HENT: Normocephalic, atraumatic Card: RRR no m/r/g Resp: CTAB. No increased WOB Extremities: 1+ pitting edema on RLE and LLE. Focused 1+ bilateral pitting edema of feet.  Patient Lines/Drains/Airways Status     Active Line/Drains/Airways     Name Placement date Placement time Site Days    Peripheral IV 04/27/24 20 G Anterior;Right Forearm 04/27/24  1148  Forearm  1   Wound / Incision (Open or Dehisced) 12/25/15 Coccyx I and d abscess 12/25/15  1538  Coccyx  3047            Pertinent labs and imaging:  TSH WNL    Latest Ref Rng & Units 05/04/2024    2:37 AM 04/30/2024    2:20 AM 04/29/2024    3:21 AM  CBC  WBC 4.0 - 10.5 K/uL 7.9  10.9  10.5   Hemoglobin 13.0 - 17.0 g/dL 88.4  89.4  89.1   Hematocrit 39.0 - 52.0 % 36.5  33.5  33.9   Platelets 150 - 400 K/uL 382  296  286        Latest Ref Rng & Units 05/05/2024    3:24 AM 05/04/2024    2:37 AM 05/03/2024    7:08 AM  CMP  Glucose 70 - 99 mg/dL 895  892  871   BUN 6 - 20 mg/dL 32  24  26   Creatinine 0.61 - 1.24 mg/dL 8.42  8.64  8.58   Sodium 135 - 145 mmol/L 139  141  139   Potassium 3.5 - 5.1 mmol/L 4.1  4.4  3.8   Chloride 98 -  111 mmol/L 101  103  102   CO2 22 - 32 mmol/L 27  26  25    Calcium 8.9 - 10.3 mg/dL 9.1  9.2  9.0     VAS US  RENAL ARTERY DUPLEX Result Date: 05/05/2024 ABDOMINAL VISCERAL Patient Name:  Jeremy Hood  Date of Exam:   05/05/2024 Medical Rec #: [id number]          Accession #:    [id number] Date of Birth: May 28, 1976          Patient Gender: M Patient Age:   49 years Exam Location:  [facility] Procedure:      VAS US  RENAL ARTERY DUPLEX Referring Phys: [id number] SWAZILAND LEE -------------------------------------------------------------------------------- Indications: Hypertension High Risk Factors: Hypertension, current smoker. Other Factors: CHF, CKD, history of substance abuse. Limitations: Air/bowel gas and obesity. Comparison Study: No previous exams Performing Technologist: Jody Hill RVT, RDMS  Examination Guidelines: A complete evaluation includes B-mode imaging, spectral Doppler, color Doppler, and power Doppler as needed of all accessible portions of each vessel. Bilateral testing is considered an integral part of a complete examination. Limited examinations for reoccurring  indications may be performed as noted.  Duplex Findings: +--------------------+--------+--------+------+--------+ Mesenteric          PSV cm/sEDV cm/sPlaqueComments +--------------------+--------+--------+------+--------+ Aorta Prox             71      11                  +--------------------+--------+--------+------+--------+ Celiac Artery Origin  103      24                  +--------------------+--------+--------+------+--------+ SMA Proximal          126      10                  +--------------------+--------+--------+------+--------+    +------------------+--------+--------+--------------+ Right Renal ArteryPSV cm/sEDV cm/s   Comment     +------------------+--------+--------+--------------+ Origin                            not visualized +------------------+--------+--------+--------------+ Proximal             71      24                  +------------------+--------+--------+--------------+ Mid                  73      26                  +------------------+--------+--------+--------------+ Distal               66      18                  +------------------+--------+--------+--------------+ +-----------------+--------+--------+-------+ Left Renal ArteryPSV cm/sEDV cm/sComment +-----------------+--------+--------+-------+ Origin              72      17           +-----------------+--------+--------+-------+ Proximal            80      23           +-----------------+--------+--------+-------+ Mid                 90      22           +-----------------+--------+--------+-------+ Distal  56      20           +-----------------+--------+--------+-------+ +------------+--------+--------+----+-----------+--------+--------+----+ Right KidneyPSV cm/sEDV cm/sRI  Left KidneyPSV cm/sEDV cm/sRI   +------------+--------+--------+----+-----------+--------+--------+----+ Upper Pole                      Upper Pole 19       7       0.65 +------------+--------+--------+----+-----------+--------+--------+----+ Mid         17      6       0.        15      5       0.64 +------------+--------+--------+----+-----------+--------+--------+----+ Lower Pole  23      8       0.64Lower Pole 21      8       0.63 +------------+--------+--------+----+-----------+--------+--------+----+ Hilar       32      13      0.61Hilar      26      8       0.68 +------------+--------+--------+----+-----------+--------+--------+----+ +------------------+-----------------+------------------+--------------+ Right Kidney                       Left Kidney                      +------------------+-----------------+------------------+--------------+ RAR                                RAR                              +------------------+-----------------+------------------+--------------+ RAR (manual)      1.03             RAR (manual)      1.27           +------------------+-----------------+------------------+--------------+ Cortex            14/4 cm/s RI 0.69Cortex            not visualized +------------------+-----------------+------------------+--------------+ Cortex thickness                   Corex thickness                  +------------------+-----------------+------------------+--------------+ Kidney length (cm)10.83            Kidney length (cm)11.79          +------------------+-----------------+------------------+--------------+   Summary: Renal:  Right: No evidence of right renal artery stenosis. Normal right        Resisitive Index. Normal size right kidney. RRV flow present. Left:  No evidence of left renal artery stenosis. Normal left        Resistive Index. Normal size of left kidney. LRV flow        present.  *See table(s) above for measurements and observations.     Preliminary     ASSESSMENT/PLAN:  Assessment: Principal Problem:   Acute systolic heart failure (HCC) Active  Problems:   Uncontrolled hypertension   Tobacco use   Cocaine use disorder (HCC)   Paroxysmal nocturnal dyspnea   Stage 3b chronic kidney disease (HCC)   HFrEF (heart failure with reduced ejection fraction) (HCC)   Plan: #HFrEF newly diagnosed with acute exacerbation ECHO done confirming HFrEF with EF 30-35%. The LV has moderately decreased function with gloal hypokinesis. LV cavity  shows moderate dilation. RV systolic function is moderately reduced and moderately enlarged. Dilation of the LA and RA are appreciated. Moderate mitral valve regurgitation is appreciated. The aortic valve is tricuspid with mild calcification appreciated.    CMRI suggests mildly dilated LV with moderate asymmetric LV hypertrophy primarily involving the basal to mid septum. LV EF 26% with diffuse hypokinesis. Normal RV size and systolic function with RV EF 51%. Diffuse mid-wall LGE throughout the basal to mid septum, mid-wall LGE in basal inferolateral wall, subepicardial LGE in the mid inferior and inferolateral walls. This is not a coronary disease pattern. Hypertrophic cardiomyopathy vs genetic (arrhythmogenic) cardiomyopathy vs infiltrative disease. LGE not as bright and focal as is often seen with sarcoidosis but cannot be ruled out.   Patient able to lie flat comfortably today. Cardio most recent note opts to start PO torsemide  today for continued diuresis which rest of continued regimen and discontinuation of IV lasix .   -spironolactone  25 mg daily -coreg  12.5mg  BID -jardiance  10mg  QD -irbesartan  300 mg daily (no adverse rxn to 25mg  losartan , previous angioedema with lisinopril ) -PO torsemide  40mg  starting 7/17 -take daily standing weights -AM BMP -genetic testing and cardiac PET will need to be done outpatient  #Headache -resolved Essential hypertension Patient continued to have hypertension (172/123,163/91,144/87) overnight.Cardio opting to look into secondary causes of hypertension, such as RAS. TSH  WNL. Renal duplex US  done is normal with no evidence of right or left renal artery stenosis. -continued outpatient for workup of OSA  #Paroxysmal nocturnal dyspnea Patient now able to lie flat, but has daytime somnolence and snoring. Might be a component of OSA. Patient has attempted to use CPAP mask during this admission but unable to tolerate -monitor for improvement post diuresis -Discussed outpatient referral for sleep study and intranasal CPAP for greater comfort if necessary  #Polyuria Patient noted nocturia at admission. Ddx: Heart failure, BPH, Diabetes, and UTI. UA positive for protein, no bacteria. PSA WNL. Polyuria at this stage could be attributed to lasix  or BNP elevation or a combination. -monitor for improvement of polyuria after diuresis  #Anemia Patient CBC revealed hgb around 11.5. TIBC normal, iron  sat 11, suggests iron  deficiency anemia. Recommending outpatient oral/IV iron .  -last dose of IV iron  today -will continue PO iron    #AKI Baseline Creatinine ~1.2 Creatinine 7/12 increased to 1.83. Held lasix  on 7/12 Creatinine 7/13 1.27. Resumed lasix  on 7/13.  Creatinine 7/14 1.42, likely secondary to losartan  addition. Creatinine 7/15 1.41, stable after increased losartan  dosage Creatinine 7/16 1.35, improving Creatinine 7/17 1.57, likely secondary to lasix   Best Practice: Diet: Cardiac diet VTE: Place TED hose Start: 05/04/24 1921 rivaroxaban  (XARELTO ) tablet 10 mg Start: 04/27/24 1345 Code: Full  Disposition planning: Family Contact: Verneita Claude, to be notified. DISPO: Anticipated discharge pending to Home pending improving clinical status.  Signature:  Brad Prey MS3  10:56 AM, 05/05/2024    Prey Brad, Medical Student 05/05/2024, 10:56 AM    I have personally seen and examined the patient and agree with the following evaluation and plan of care for this patient.  -Sallyanne Primas, DO [facility]

## 2024-05-06 ENCOUNTER — Other Ambulatory Visit (HOSPITAL_COMMUNITY): Payer: Self-pay

## 2024-05-06 LAB — CBC
HCT: 38.2 % — ABNORMAL LOW (ref 39.0–52.0)
Hemoglobin: 12 g/dL — ABNORMAL LOW (ref 13.0–17.0)
MCH: 26.9 pg (ref 26.0–34.0)
MCHC: 31.4 g/dL (ref 30.0–36.0)
MCV: 85.7 fL (ref 80.0–100.0)
Platelets: 400 K/uL (ref 150–400)
RBC: 4.46 MIL/uL (ref 4.22–5.81)
RDW: 15.1 % (ref 11.5–15.5)
WBC: 9.4 K/uL (ref 4.0–10.5)
nRBC: 0 % (ref 0.0–0.2)

## 2024-05-06 LAB — BASIC METABOLIC PANEL WITH GFR
Anion gap: 11 (ref 5–15)
BUN: 37 mg/dL — ABNORMAL HIGH (ref 6–20)
CO2: 26 mmol/L (ref 22–32)
Calcium: 9.2 mg/dL (ref 8.9–10.3)
Chloride: 102 mmol/L (ref 98–111)
Creatinine, Ser: 1.55 mg/dL — ABNORMAL HIGH (ref 0.61–1.24)
GFR, Estimated: 55 mL/min — ABNORMAL LOW (ref >60)
Glucose, Bld: 137 mg/dL — ABNORMAL HIGH (ref 70–99)
Potassium: 4.1 mmol/L (ref 3.5–5.1)
Sodium: 139 mmol/L (ref 135–145)

## 2024-05-06 LAB — MAGNESIUM: Magnesium: 2.3 mg/dL (ref 1.7–2.4)

## 2024-05-06 MED ORDER — EMPAGLIFLOZIN 10 MG PO TABS
10.0000 mg | ORAL_TABLET | Freq: Every day | ORAL | 0 refills | Status: DC
  Filled 2024-05-06: qty 30, 30d supply, fill #0

## 2024-05-06 MED ORDER — HYDRALAZINE HCL 25 MG PO TABS
25.0000 mg | ORAL_TABLET | Freq: Three times a day (TID) | ORAL | Status: DC
  Administered 2024-05-06: 25 mg via ORAL
  Filled 2024-05-06: qty 1

## 2024-05-06 MED ORDER — SPIRONOLACTONE 25 MG PO TABS
25.0000 mg | ORAL_TABLET | Freq: Every day | ORAL | 0 refills | Status: DC
  Filled 2024-05-06: qty 30, 30d supply, fill #0

## 2024-05-06 MED ORDER — TORSEMIDE 20 MG PO TABS
40.0000 mg | ORAL_TABLET | Freq: Every day | ORAL | 0 refills | Status: DC
  Filled 2024-05-06: qty 60, 30d supply, fill #0

## 2024-05-06 MED ORDER — FERROUS SULFATE 325 (65 FE) MG PO TABS
325.0000 mg | ORAL_TABLET | Freq: Every day | ORAL | 3 refills | Status: AC
  Filled 2024-05-06 – 2024-09-14 (×2): qty 30, 30d supply, fill #0
  Filled 2024-11-04: qty 90, 90d supply, fill #0
  Filled 2024-11-18: qty 30, 30d supply, fill #0

## 2024-05-06 MED ORDER — CARVEDILOL 12.5 MG PO TABS
12.5000 mg | ORAL_TABLET | Freq: Two times a day (BID) | ORAL | 0 refills | Status: DC
  Filled 2024-05-06: qty 60, 30d supply, fill #0

## 2024-05-06 MED ORDER — VALSARTAN 160 MG PO TABS
160.0000 mg | ORAL_TABLET | Freq: Two times a day (BID) | ORAL | 0 refills | Status: DC
  Filled 2024-05-06: qty 60, 30d supply, fill #0

## 2024-05-06 MED ORDER — HYDRALAZINE HCL 25 MG PO TABS
25.0000 mg | ORAL_TABLET | Freq: Three times a day (TID) | ORAL | 0 refills | Status: DC
  Filled 2024-05-06: qty 90, 30d supply, fill #0

## 2024-05-06 MED ORDER — HYDRALAZINE HCL 25 MG PO TABS
25.0000 mg | ORAL_TABLET | Freq: Once | ORAL | Status: AC
  Administered 2024-05-06: 25 mg via ORAL
  Filled 2024-05-06: qty 1

## 2024-05-06 NOTE — Discharge Summary (Addendum)
 Name: Jeremy Hood MRN: [id number] DOB: Aug 02, 1976 48 y.o. PCP: Patient, No Pcp Per  Date of Admission: 04/27/2024  9:18 AM Date of Discharge: May 06, 2024 Attending Physician: Dr. Dayton Eastern  Discharge Diagnosis: 1. Principal Problem:   Acute systolic heart failure (HCC) Active Problems:   Uncontrolled hypertension   Tobacco use   Cocaine use disorder (HCC)   Paroxysmal nocturnal dyspnea   Stage 3b chronic kidney disease (HCC)   HFrEF (heart failure with reduced ejection fraction) (HCC)   Discharge Medications: Allergies as of 05/06/2024       Reactions   Zestril  [lisinopril ] Swelling   Angioedema with ACEI Tolerated Losartan  without issue        Medication List     STOP taking these medications    amLODipine  10 MG tablet Commonly known as: NORVASC        TAKE these medications    carvedilol  12.5 MG tablet Commonly known as: COREG  Take 1 tablet (12.5 mg total) by mouth 2 (two) times daily with a meal.   FeroSul 325 (65 Fe) MG tablet Generic drug: ferrous sulfate  Take 1 tablet (325 mg total) by mouth daily with breakfast. Start taking on: May 07, 2024   gabapentin 300 MG capsule Commonly known as: NEURONTIN Take 300 mg by mouth daily as needed (pain, neuropathy).   hydrALAZINE  25 MG tablet Commonly known as: APRESOLINE  Take 1 tablet (25 mg total) by mouth every 8 (eight) hours.   Jardiance  10 MG Tabs tablet Generic drug: empagliflozin  Take 1 tablet (10 mg total) by mouth daily. Start taking on: May 07, 2024   spironolactone  25 MG tablet Commonly known as: ALDACTONE  Take 1 tablet (25 mg total) by mouth daily. Start taking on: May 07, 2024   torsemide  20 MG tablet Commonly known as: DEMADEX  Take 2 tablets (40 mg total) by mouth daily. Start taking on: May 07, 2024   valsartan 160 MG tablet Commonly known as: Diovan Take 1 tablet (160 mg total) by mouth 2 (two) times daily.        Disposition and follow-up:   Mr.Dagen L  Paxson was discharged from [facility] in Good condition.  At the hospital follow up visit please address:  Patient will need refills on medications at their pharmacy.     HFrEF (EF 30-35%): F/u with medications and SOB, f/u with cardiac rehab!   HTN: F/u with BP check and medications Cocaine Use Disorder: Follow up with cessation of cocaine use OSA: Patient has hx of snoring. Patient does not have insurance, but resources for osa testing / cpap  Hematochezia: endorsed on day of discharge. F/u symptoms, colonoscopy, EGD  2.  Labs / imaging needed at time of follow-up: CBC, CMP, colonoscopy, cardiac biopsy, genetic testing for HFrEF, BP, polysomnography, colonoscopy, EGD  3.  Pending labs/ test needing follow-up: n/a  Follow-up Appointments:  Ensure follow up with cardiac rehab    Follow-up Information     Patti Mliss LABOR, FNP Follow up.   Specialty: Family Medicine Why: TIME : 2:00 PM  PLEASE ARRIVE AT 1:30 PM DATE: JULY 30 , 2025  PLEASE BRING ALL CURRENT MEDICATION and ID Contact information: [address] [place] [address] [place] [address] [phone]         [facility] Follow up on 05/19/2024.   Specialty: Cardiology Why: Dr. Zenaida 10:40 AM Advanced Heart Failure Clinic Entrance C, Free Valet Parking Contact information: [address] [place]  [place] [address] [phone]                 Hospital Course by problem list: Jeremy Hood is a 48 y.o. with a pertinent PMH of HTN, CKD, cocaine use, multifocal cavitary lung disease, new diagnosed CHF (HFrEF, EF 30-35%), who presented with breathing difficulty, especially at night, and admitted for newly diagnosed CHF now being discharged on hospital day 9 with the following pertinent hospital course:  Below is the hospital course for [facility] for admission dated 04/27/24:   In the ED, EKG showed Biatrial enlargement Right  bundle branch block Left anterior fascicular block Bifascicular block. BNP was 1356.9. Troponin slightly elevated 137>127. A1C 5.9. Lipid panel showed chol 126, trig 196, LDL 56.   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 He was previously prescribed amlodipine  10 mg but has not been taking this. He smokes 1 pack of cigarettes every few days, smokes marijuana, and uses insufflated cocaine every couple of weeks. Upon admission, echo was ordered and furosemide  40 mg IV was started. Lasix  40mg  q6h was continued to diurese and resolve pulmonary edema on 07/10.   GDMT added: Spironolactone , Losartan , Coreg . Lasix  continued. BiDil  TID also started on 07/10. When losartan  was added, it was started at low dose to assess if angioedema would be present as patient has hx of angioedema reaction to lisinopril .   ECHO reviewed and showed HFrEF with EF 30-35%. The LV has moderately decreased function with gloal hypokinesis. LV cavity shows moderate dilation. RV systolic function is moderately reduced and moderately enlarged. Dilation of the LA and RA are appreciated. Moderate mitral valve regurgitation is appreciated. The aortic valve is tricuspid with mild calcification appreciated.  GDMT titrated up throughout hospitalization due to continued high BP.   Cardio consulted on 7/14 and ordered CMRI. Cardio adjusted losartan  to irbesartan  with addition of jardiance .  Patient had last dose of IV lasix  on 7/16 with continued regimen of:  -coreg  12.5mg , jardiance  10mg , irbesartan  300 mg + PO torsemide  40 mg   Patient had improved diuresis throughout admission with ability to lie flat with decreased SOB on 7/16 and 7/17.   HTN Headache Patient BP elevated 173/127 in ED. Was previously prescribed amlodipine  but was not taking it  regularly. Amlodipine  10 mg daily started on admission 07/09. Patient with persistent HTN through night 07/09 (152/104, 153/94) so BiDil  started on 07/10 and amlodipine  discontinued. Patient had persistent hypertension and headache on 07/11, so losartan  12.5mg  added, as well as magnesium  sulfate IVPB and compazine  for headache relief if 2/2 to HTN.  Patient continued on this regimen on 07/12 with addition of coreg  6.125 BID. On 7/13, BiDil  switched to hydralazine  25mg  and imdur  20 mg to see if it resolves headaches. Losartan  increased to 25 mg on 7/13.  Hydralazine  25mg  and Imdur  30 mg discontinued due to continued headaches. Patient continued to have hypertension (173/105, 173/127, 141/96,146/106) night of 7/14. Cardio opted to look into secondary causes of hypertension, such as RAS. On 7/17, Patient continued to have hypertension (172/123,163/91,144/87) overnight. Renal duplex US  done is normal with no evidence of right or left renal artery stenosis. Discussed outpatient for workup of OSA.  Cocaine use disorder Patient uses insufflated cocaine every couple of weeks, which could be exacerbating HF. Counseled about importance of complete abstinence on admission 07/09.  Paroxysmal nocturnal dyspnea Patient has choking and gasping during sleep. On admission 07/09, was attributed to either HF or possible OSA. A trial of CPAP was given on multiple nights (07/10, 07/12) during the course of admission, which was not tolerated due to cumbersome nature of the CPAP mask. Discussed with patient importance of outpatient follow up for sleep study at admission. ** cost prohibitive.  Polyuria Patient noted nocturia on 07/10, which could be attributed to lasix , HFrEF with physiologically increased BNP, BPH, diabetes, or UTI. Workup revealed PSA WNL, UA WNL, and A1c not indicative of diabetes. Patient was monitored for improvement through admission until day of discharge for improvement with continued diuresis.    Anemia Iron  deficiency anemia Patient CBC reveal Hgb around 10 on 07/10. TIBC normal, iron  sat 11, suggests iron  deficiency anemia. Patient was recommended outpatient oral/IV iron . Patient was given PO iron  during course of admission. She was given two doses of IV iron  on 07/16 and 07/17.  ** outpatient screening for colonoscopy recommended.  AKI with baseline creatinine ~1.2 Patient had initial creatinine 1.71 and resolved to 1.34 after diuresis. Initial AKI attributed to fluid overload from cardiorenal etiology then later attributed to lasix  use. Patient had continuous monitoring of creatinine throughout admission with final creatinine prior to discharge 1.55.   Primary care needs SDOH On 7/12, discussed with patient establishing care with a PCP at [facility]. He is currently uninsured and will need assistance with medication access. Patient plans to have appt with [facility] at discharge.   Subjective  Patient was awake, alert, and laying sideways in bed. He denies HA or SOB.   Discharge Exam:   BP (!) 143/99 (BP Location: Left Arm)   Pulse 77   Temp 98.4 F (36.9 C) (Oral)   Resp 19   Ht 5' 11 (1.803 m)   Wt 132 kg   SpO2 97%   BMI 40.59 kg/m  Discharge exam:   Physical Exam Cardiovascular:     Rate and Rhythm: Normal rate and regular rhythm.     Heart sounds: Normal heart sounds.  Pulmonary:     Effort: Pulmonary effort is normal.     Breath sounds: Normal breath sounds.  Abdominal:     General: Bowel sounds are normal.     Palpations: Abdomen is soft.  Musculoskeletal:     Right lower leg: Edema (1+) present.     Left lower leg: Edema present.  Skin:    General: Skin is warm and dry.  Neurological:     Mental Status: He is alert.      Pertinent Labs, Studies, and Procedures:     Latest Ref Rng & Units 05/06/2024    3:02 AM 05/04/2024    2:37 AM 04/30/2024    2:20 AM  CBC  WBC 4.0 - 10.5 K/uL 9.4  7.9  10.9   Hemoglobin 13.0 - 17.0 g/dL 87.9  88.4  89.4   Hematocrit  39.0 - 52.0 % 38.2  36.5  33.5   Platelets 150 - 400 K/uL 400  382  296        Latest Ref Rng & Units 05/06/2024    3:02 AM 05/05/2024    3:24 AM 05/04/2024    2:37 AM  CMP  Glucose 70 - 99 mg/dL 862  895  892   BUN 6 - 20 mg/dL 37  32  24   Creatinine 0.61 - 1.24 mg/dL 8.44  8.42  8.64   Sodium 135 - 145 mmol/L 139  139  141   Potassium 3.5 - 5.1 mmol/L 4.1  4.1  4.4   Chloride 98 - 111 mmol/L 102  101  103   CO2 22 - 32 mmol/L 26  27  26    Calcium 8.9 - 10.3 mg/dL 9.2  9.1  9.2     ECHOCARDIOGRAM COMPLETE Result Date: 04/28/2024    ECHOCARDIOGRAM REPORT   Patient Name:   Mavrick L Woodrow Date of Exam: 04/28/2024 Medical Rec #:  [id number]         Height:       71.0 in Accession #:    [id number]        Weight:       294.0 lb Date of Birth:  [birth year]         BSA:          2.484 m Patient Age:    48 years          BP:           194/122 mmHg Patient Gender: M                 HR:           79 bpm. Exam Location:  Inpatient Procedure: 2D Echo, Cardiac Doppler, Color Doppler and Intracardiac            Opacification Agent (Both Spectral and Color Flow Doppler were            utilized during procedure). Indications:    CHF  History:        Patient has no prior history of Echocardiogram examinations.                 Signs/Symptoms:Chest Pain; Risk Factors:Hypertension.  Sonographer:    Jayson Gaskins Referring Phys: [id number] ELSIE KATHEE SAVANNAH IMPRESSIONS  1. Left ventricular ejection fraction, by estimation, is 30 to 35%. The left ventricle has moderately decreased function. The left ventricle demonstrates global hypokinesis. The left ventricular internal cavity size was moderately dilated. There is moderate concentric left ventricular hypertrophy. Left ventricular diastolic parameters are consistent with Grade II diastolic dysfunction (pseudonormalization).  2. Right ventricular systolic function is moderately reduced. The right ventricular size is moderately enlarged.  3. Left atrial size was moderately  dilated.  4. Right atrial size was mildly dilated.  5. The mitral valve is normal in structure. Moderate mitral valve regurgitation. No evidence of mitral stenosis.  6. The aortic valve is tricuspid. There is mild calcification of the aortic valve. Aortic valve regurgitation is trivial. Aortic valve sclerosis/calcification is present, without any evidence of aortic stenosis. FINDINGS  Left Ventricle: Left ventricular ejection fraction, by estimation, is 30 to 35%. The left ventricle has moderately decreased function. The left ventricle demonstrates global hypokinesis. Definity  contrast agent was given IV to delineate the left ventricular endocardial borders. The left ventricular internal cavity size was moderately dilated. There is moderate concentric left ventricular hypertrophy. Left ventricular diastolic parameters are consistent with Grade II diastolic dysfunction (pseudonormalization). Right Ventricle: The right ventricular size is moderately enlarged. No increase in right ventricular wall thickness. Right ventricular systolic function is moderately reduced. Left Atrium: Left atrial size was moderately dilated. Right Atrium: Right atrial size was mildly dilated. Pericardium: There is no evidence of pericardial effusion. Mitral Valve: The mitral valve is normal in structure. Moderate mitral valve regurgitation, with centrally-directed jet. No evidence of mitral valve stenosis. Tricuspid Valve: The tricuspid valve is normal in structure. Tricuspid valve regurgitation is not demonstrated. No evidence  of tricuspid stenosis. Aortic Valve: The aortic valve is tricuspid. There is mild calcification of the aortic valve. Aortic valve regurgitation is trivial. Aortic valve sclerosis/calcification is present, without any evidence of aortic stenosis. Aortic valve mean gradient measures 3.0 mmHg. Aortic valve peak gradient measures 4.2 mmHg. Aortic valve area, by VTI measures 2.54 cm. Pulmonic Valve: The pulmonic valve was  normal in structure. Pulmonic valve regurgitation is mild. No evidence of pulmonic stenosis. Aorta: The aortic root is normal in size and structure. Venous: The inferior vena cava was not well visualized. IAS/Shunts: No atrial level shunt detected by color flow Doppler.  LEFT VENTRICLE PLAX 2D LVIDd:         6.40 cm   Diastology LVIDs:         5.20 cm   LV e' medial:    6.42 cm/s LV PW:         1.60 cm   LV E/e' medial:  13.1 LV IVS:        1.20 cm   LV e' lateral:   5.98 cm/s LVOT diam:     2.00 cm   LV E/e' lateral: 14.1 LV SV:         39 LV SV Index:   16 LVOT Area:     3.14 cm  RIGHT VENTRICLE RV S prime:     12.40 cm/s TAPSE (M-mode): 1.9 cm LEFT ATRIUM             Index        RIGHT ATRIUM           Index LA Vol (A2C):   90.5 ml 36.44 ml/m  RA Area:     17.90 cm LA Vol (A4C):   94.2 ml 37.93 ml/m  RA Volume:   46.20 ml  18.60 ml/m LA Biplane Vol: 94.6 ml 38.09 ml/m  AORTIC VALVE AV Area (Vmax):    2.58 cm AV Area (Vmean):   2.30 cm AV Area (VTI):     2.54 cm AV Vmax:           103.00 cm/s AV Vmean:          76.500 cm/s AV VTI:            0.152 m AV Peak Grad:      4.2 mmHg AV Mean Grad:      3.0 mmHg LVOT Vmax:         84.60 cm/s LVOT Vmean:        56.100 cm/s LVOT VTI:          0.123 m LVOT/AV VTI ratio: 0.81  AORTA Ao Root diam: 3.40 cm MITRAL VALVE MV Area (PHT): 4.44 cm    SHUNTS MV Decel Time: 171 msec    Systemic VTI:  0.12 m MV E velocity: 84.40 cm/s  Systemic Diam: 2.00 cm MV A velocity: 41.90 cm/s MV E/A ratio:  2.01 Toribio Fuel MD Electronically signed by Toribio Fuel MD Signature Date/Time: 04/28/2024/11:01:36 PM    Final    DG Chest 2 View Result Date: 04/27/2024 CLINICAL DATA:  shortness of breath, chest tightness for months EXAM: CHEST - 2 VIEW COMPARISON:  September 25, 2023 FINDINGS: Diffuse bilateral perihilar interstitial opacities. Hazy airspace opacities in both lung bases. No pleural effusion or pneumothorax. Moderate cardiomegaly. No acute fracture or destructive  lesions. IMPRESSION: Cardiomegaly with findings suggestive of pulmonary edema. Electronically Signed   By: Rogelia Myers M.D.   On: 04/27/2024 09:00     Discharge Instructions:  Discharge Instructions     (HEART FAILURE PATIENTS) Call MD:  Anytime you have any of the following symptoms: 1) 3 pound weight gain in 24 hours or 5 pounds in 1 week 2) shortness of breath, with or without a dry hacking cough 3) swelling in the hands, feet or stomach 4) if you have to sleep on extra pillows at night in order to breathe.   Complete by: As directed    Call MD for:  difficulty breathing, headache or visual disturbances   Complete by: As directed    Call MD for:  persistant dizziness or light-headedness   Complete by: As directed    Call MD for:  persistant nausea and vomiting   Complete by: As directed    Diet - low sodium heart healthy   Complete by: As directed    Limit your sodium to less than 2000 mg in a day and no more thn of fluids in a day   Discharge instructions   Complete by: As directed    Thank you for allowing us  to be part of your care. You were hospitalized for shortness of breath and found to have a new diagnosis of heart failure and mitral valve regurgitation. We treated you with diuretics ( medications that make you pee) and blood pressure medications. You were also seen by the [facility] Heart failure team. You are currently stable for discharge with the following instructions  See the changes in your medications and management of your chronic conditions below:  *For heart failure -We have STARTED you on these following medications:  - Torsemide  40 mg daily  - Spironolactone  25 mg daily  - Carvedilol  12.5 tablet every 12 hours  - Valsartan 160  mg every 12 hours  - Jardiance  10 mg tablet every day  *For hypertension -We have STARTED you on these following medications:  - In addition to the above, we have also started you on Hydralazine   - Take Hydralazine  25 mg every 8  hours  -Monitor your blood pressure every day after taking your medications  - Bring a log with you when you come see the primary care provider -We have STOPPED the following medications:  - Amlodipine   *For anemia You received iron  infusions during this admission.  -We have STARTED you on these following medications:  - Iron  tablets - you can take one every day. If you experience constipation, you can take Senna, which you can find over the counter  Beside continuing the medications above and following up with the doctors appointments below, you should stop using cocaine.   FOLLOW UP APPOINTMENTS: You will receive a phone call to set up a primary care appointment with the Internal Medicine Center at [facility] on [address] [facility] in [place]  in the next 2 weeks.  You will also see Dr. Zenaida at the Heart Failure clinic at [facility] st on 05/19/2024 at 10:40 AM   Please call your PCP or our clinic if you have any questions or concerns, we may be able to help and keep you from a long and expensive emergency room wait. Our clinic and after hours phone number is [phone]. The best time to call is Monday through Friday 9 am to 4 pm but there is always someone available 24/7 if you have an emergency. If you need medication refills please notify your pharmacy one week in advance and they will send us  a request.   We are glad you are feeling better,  Hadassah Ala Internal Medicine Inpatient Teaching Service at [facility]   Increase activity slowly   Complete by: As directed        Signed: Benuel Braun, DO 05/06/2024, 6:49 PM

## 2024-05-06 NOTE — Progress Notes (Addendum)
 Advanced Heart Failure Rounding Note  HF Cardiologist: Dr. Zenaida  Chief Complaint: Acute systolic heart failure  Subjective:    Feeling well this am. No dyspnea or orthopnea.   BP elevated again overnight. Received 25 mg po hydralazine .  Objective:    Weight Range: 132 kg Body mass index is 40.59 kg/m.   Vital Signs:   Temp:  [97.6 F (36.4 C)-98.5 F (36.9 C)] 98.4 F (36.9 C) (07/18 0758) Pulse Rate:  [72-89] 89 (07/18 0758) Resp:  [15-30] 19 (07/18 0758) BP: (122-175)/(57-116) 144/97 (07/18 0758) SpO2:  [91 %-96 %] 96 % (07/18 0758) Weight:  [132 kg] 132 kg (07/18 0411) Last BM Date : 05/05/24  Weight change: Filed Weights   05/04/24 0500 05/05/24 0612 05/06/24 0411  Weight: 133.1 kg 132.2 kg 132 kg   Intake/Output:  Intake/Output Summary (Last 24 hours) at 05/06/2024 0929 Last data filed at 05/06/2024 0759 Gross per 24 hour  Intake 240 ml  Output 6125 ml  Net -5885 ml    Physical Exam    General:  Well appearing. Sitting up on side of bed Neck: no JVD Cor: Regular rate & rhythm. No rubs, gallops or murmurs. Lungs: clear Abdomen: obese, soft, nontender, nondistended.  Extremities: no edema Neuro: alert & orientedx3. Affect pleasant    Telemetry   SR 70s-80s  Labs    CBC Recent Labs    05/04/24 0237 05/06/24 0302  WBC 7.9 9.4  HGB 11.5* 12.0*  HCT 36.5* 38.2*  MCV 86.7 85.7  PLT 382 400   Basic Metabolic Panel Recent Labs    92/83/74 0237 05/05/24 0324 05/06/24 0302  NA 141 139 139  K 4.4 4.1 4.1  CL 103 101 102  CO2 26 27 26   GLUCOSE 107* 104* 137*  BUN 24* 32* 37*  CREATININE 1.35* 1.57* 1.55*  CALCIUM 9.2 9.1 9.2  MG 2.4  --  2.3   BNP (last 3 results) Recent Labs    04/27/24 1100  BNP 1,356.9*   Imaging   VAS US  RENAL ARTERY DUPLEX Result Date: 05/05/2024 ABDOMINAL VISCERAL Patient Name:  Jeremy Hood  Date of Exam:   05/05/2024 Medical Rec #: [id number]          Accession #:    [id number] Date of Birth:  07-09-1976          Patient Gender: M Patient Age:   48 years Exam Location:  [facility] Procedure:      VAS US  RENAL ARTERY DUPLEX Referring Phys: [id number] SWAZILAND LEE -------------------------------------------------------------------------------- Indications: Hypertension High Risk Factors: Hypertension, current smoker. Other Factors: CHF, CKD, history of substance abuse. Limitations: Air/bowel gas and obesity. Comparison Study: No previous exams Performing Technologist: Jody Hill RVT, RDMS  Examination Guidelines: A complete evaluation includes B-mode imaging, spectral Doppler, color Doppler, and power Doppler as needed of all accessible portions of each vessel. Bilateral testing is considered an integral part of a complete examination. Limited examinations for reoccurring indications may be performed as noted.  Duplex Findings: +--------------------+--------+--------+------+--------+ Mesenteric          PSV cm/sEDV cm/sPlaqueComments +--------------------+--------+--------+------+--------+ Aorta Prox             71      11                  +--------------------+--------+--------+------+--------+ Celiac Artery Origin  103      24                  +--------------------+--------+--------+------+--------+  SMA Proximal          126      10                  +--------------------+--------+--------+------+--------+    +------------------+--------+--------+--------------+ Right Renal ArteryPSV cm/sEDV cm/s   Comment     +------------------+--------+--------+--------------+ Origin                            not visualized +------------------+--------+--------+--------------+ Proximal             71      24                  +------------------+--------+--------+--------------+ Mid                  73      26                  +------------------+--------+--------+--------------+ Distal               66      18                   +------------------+--------+--------+--------------+ +-----------------+--------+--------+-------+ Left Renal ArteryPSV cm/sEDV cm/sComment +-----------------+--------+--------+-------+ Origin              72      17           +-----------------+--------+--------+-------+ Proximal            80      23           +-----------------+--------+--------+-------+ Mid                 90      22           +-----------------+--------+--------+-------+ Distal              56      20           +-----------------+--------+--------+-------+ +------------+--------+--------+----+-----------+--------+--------+----+ Right KidneyPSV cm/sEDV cm/sRI  Left KidneyPSV cm/sEDV cm/sRI   +------------+--------+--------+----+-----------+--------+--------+----+ Upper Pole                      Upper Pole 19      7       0.65 +------------+--------+--------+----+-----------+--------+--------+----+ Mid         17      6       0.        15      5       0.64 +------------+--------+--------+----+-----------+--------+--------+----+ Lower Pole  23      8       0.64Lower Pole 21      8       0.63 +------------+--------+--------+----+-----------+--------+--------+----+ Hilar       32      13      0.61Hilar      26      8       0.68 +------------+--------+--------+----+-----------+--------+--------+----+ +------------------+-----------------+------------------+--------------+ Right Kidney                       Left Kidney                      +------------------+-----------------+------------------+--------------+ RAR                                RAR                              +------------------+-----------------+------------------+--------------+  RAR (manual)      1.03             RAR (manual)      1.27           +------------------+-----------------+------------------+--------------+ Cortex            14/4 cm/s RI 0.69Cortex            not visualized  +------------------+-----------------+------------------+--------------+ Cortex thickness                   Corex thickness                  +------------------+-----------------+------------------+--------------+ Kidney length (cm)10.83            Kidney length (cm)11.79          +------------------+-----------------+------------------+--------------+  Summary: Renal:  Right: No evidence of right renal artery stenosis. Normal right        Resisitive Index. Normal size right kidney. RRV flow present. Left:  No evidence of left renal artery stenosis. Normal left        Resistive Index. Normal size of left kidney. LRV flow        present.  *See table(s) above for measurements and observations.  Diagnosing physician: Debby Robertson  Electronically signed by Debby Robertson on 05/05/2024 at 4:59:55 PM.    Final     Medications:    Scheduled Medications:  carvedilol   12.5 mg Oral BID WC   empagliflozin   10 mg Oral Daily   ferrous sulfate   325 mg Oral Q breakfast   irbesartan   300 mg Oral Daily   rivaroxaban   10 mg Oral Daily   senna-docusate  2 tablet Oral QHS   spironolactone   25 mg Oral Daily   torsemide   40 mg Oral Daily   Infusions:   PRN Medications: acetaminophen , mouth rinse, prochlorperazine , trimethobenzamide   Patient Profile   49 y/o male w/ h/o untreated/uncontrolled HTN, diastolic heart failure, poor compliance and substance use, admitted w/ new systolic heart failure.   Assessment/Plan   1. Acute Systolic Heart Failure - Suspect most likely HTN CM but will need to r/o infiltrative process.  - Echo 2018 EF 60-65%, severe LVH w/ GIDD, RV ok - Echo this admit, EF now 30-35%, mod LVH, RV mod reduced  - cMRI 05/03/24: LVEF 26%, RVEF 51%, LGE pattern could be seen in HCM but could also represent genetic (arrhythmogenic) or infiltrative cardiomyopathy. Cannot exclude cardiac sarcoid. - Unable to obtain cardiac PET for sarcoid workup until he has insurance. Will need genetic  testing. - NYHA IIIb on admit. Improved with diuresis. Continue 40 mg Torsemide  daily - Switch irbesartan  to 160 mg valsartan BID - intolerant to nitrates d/t headache. Start hydralazine  25 mg TID. - no Entresto given angioedema w/ ACE-I  - continue spironolactone  25 mg daily  - continue coreg  12.5 mg bid  - continue Jardiance  10 mg daily, patient assistance in process - imperative to quit cocaine    2. Hypertension  - BP improving, but still needs better control in PM - HF GDMT titration per above  - No evidence of renal artery stenosis on dopplers - outpatient sleep study once he has insurance   3. Polysubstance use - advised to quit cocaine and THC    4. IDA: tsat 11 - ? If 2/2 hemorrhoidal bleeding  - given IV Fe - will need PCP and outpatient w/u   He is uninsured. Reviewed meds with HF PharmD. Coreg , hydralazine , spironolactone  and Torsemide  can go  under HF fund. Switch irbesartan  to valsartan 160 BID at discharge. Patient assistance pending for jardiance , can give 30 day free trial.  Has follow-up in HF clinic scheduled.  Okay for discharge from HF standpoint.  HF regimen Coreg  12.5 mg BID - HF fund Jardiance  10 mg daily - 30 day free card Hydralazine  25 mg TID Stop Irbesartan . Start Valsartan 160 mg BID. Spironolactone  25 mg daily Torsemide  40 mg daily   Length of Stay: 9  Rasha Ibe N, PA-C  05/06/2024, 9:29 AM  Advanced Heart Failure Team Pager [phone] (M-F; 7a - 5p)  Please contact [facility] for night-coverage after hours (5p -7a ) and weekends on [email or web address]

## 2024-05-06 NOTE — Progress Notes (Signed)
 Went over AVS with patient, answered any/all questions, IV's removed, patient belongings gathered, TOC meds and work note give, patient wheeled out to family vehicle.

## 2024-05-06 NOTE — Plan of Care (Signed)
  Problem: Clinical Measurements: Goal: Will remain free from infection Outcome: Progressing Goal: Respiratory complications will improve Outcome: Progressing   Problem: Activity: Goal: Risk for activity intolerance will decrease Outcome: Progressing   Problem: Nutrition: Goal: Adequate nutrition will be maintained Outcome: Progressing   Problem: Elimination: Goal: Will not experience complications related to urinary retention Outcome: Progressing   Problem: Safety: Goal: Ability to remain free from injury will improve Outcome: Progressing   Problem: Skin Integrity: Goal: Risk for impaired skin integrity will decrease Outcome: Progressing   Problem: Activity: Goal: Capacity to carry out activities will improve Outcome: Progressing

## 2024-05-06 NOTE — TOC Transition Note (Signed)
 Transition of Care [facility]) - Discharge Note   Patient Details  Name: Jeremy Hood MRN: [id number] Date of Birth: [birth year]  Transition [facility] CM/SW Contact:  Justina Delcia Czar, RN Phone Number: [phone] 05/06/2024, 1:51 PM   Clinical Narrative:    Scheduled dc home today. Family will provide transportation home. Will use HF funds for medications at dc. Cancel MATCH.  PCP appt was arranged with Mliss Minors NP for 05/18/2024 at 130 pm.   Final next level of care: Home/Self Care Barriers to Discharge: No Barriers Identified   Patient Goals and CMS Choice Patient states their goals for this hospitalization and ongoing recovery are:: wants to get better          Discharge Placement                       Discharge Plan and Services Additional resources added to the After Visit Summary for   In-house Referral: Financial Counselor, PCP / [facility] Discharge Planning Services: CM Consult, [facility], Follow-up appt scheduled                                 Social Drivers of Health (SDOH) Interventions SDOH Screenings   Food Insecurity: No Food Insecurity (04/27/2024)  Housing: Low Risk  (04/27/2024)  Transportation Needs: No Transportation Needs (04/27/2024)  Utilities: Not At Risk (04/27/2024)  Alcohol  Screen: Low Risk  (04/29/2024)  Financial Resource Strain: High Risk (04/29/2024)  Social Connections: Moderately Isolated (04/27/2024)  Tobacco Use: High Risk (04/29/2024)     Readmission Risk Interventions     No data to display

## 2024-05-06 NOTE — Progress Notes (Signed)
 Patient came out of room and stated that he had a bloody BM, Patient had flush BM so wasn't able to visualize for myself. Patient stated it wasn't a lot but was noticeable. Messaged provider and will continue to monitor

## 2024-05-06 NOTE — Discharge Instructions (Signed)
 Thank you for allowing us  to be part of your care. You were hospitalized for shortness of breath and found to have a new diagnosis of heart failure and mitral valve regurgitation. We treated you with diuretics ( medications that make you pee) and blood pressure medications. You were also seen by the [facility] Heart failure team. You are currently stable for discharge with the following instructions  See the changes in your medications and management of your chronic conditions below:  *For heart failure -We have STARTED you on these following medications:  - Torsemide  40 mg daily  - Spironolactone  25 mg daily  - Carvedilol  12.5 tablet every 12 hours  - Valsartan 160  mg every 12 hours  - Jardiance  10 mg tablet every day  *For hypertension -We have STARTED you on these following medications:  - In addition to the above, we have also started you on Hydralazine   - Take Hydralazine  25 mg every 8 hours  -Monitor your blood pressure every day after taking your medications  - Bring a log with you when you come see the primary care provider -We have STOPPED the following medications:  - Amlodipine   *For anemia You received iron  infusions during this admission.  -We have STARTED you on these following medications:  - Iron  tablets - you can take one every day. If you experience constipation, you can take Senna, which you can find over the counter  Beside continuing the medications above and following up with the doctors appointments below, you should stop using cocaine.   FOLLOW UP APPOINTMENTS: You will receive a phone call to set up a primary care appointment with the Internal Medicine Center at [facility] on [address] in [place]  in the next 2 weeks.  You will also see Dr. Zenaida at the Heart Failure clinic at [facility] on 05/19/2024 at 10:40 AM   Please call your PCP or our clinic if you have any questions or concerns, we may be able to help and keep you from a long and expensive  emergency room wait. Our clinic and after hours phone number is [phone]. The best time to call is Monday through Friday 9 am to 4 pm but there is always someone available 24/7 if you have an emergency. If you need medication refills please notify your pharmacy one week in advance and they will send us  a request.   We are glad you are feeling better,  Hadassah Ala Internal Medicine Inpatient Teaching Service at [facility]

## 2024-05-11 ENCOUNTER — Other Ambulatory Visit (HOSPITAL_COMMUNITY): Payer: Self-pay

## 2024-05-19 ENCOUNTER — Encounter (HOSPITAL_COMMUNITY): Payer: Self-pay | Admitting: Cardiology

## 2024-05-23 ENCOUNTER — Telehealth: Payer: Self-pay | Admitting: General Practice

## 2024-05-23 NOTE — Telephone Encounter (Signed)
 Patient contacted via telephone at the request of Dr. Benuel due to the fact that he cancelled his HFU appt on 05/09/2024.  I was able to speak with patient and he made me aware he cancelled his appointment due lack of insurance.  Gave him the number to the financial counselor, Eric Beckwith, to speak with in reference to getting assistance with his bills.

## 2024-05-26 ENCOUNTER — Telehealth (HOSPITAL_COMMUNITY): Payer: Self-pay | Admitting: Cardiology

## 2024-05-26 ENCOUNTER — Telehealth (HOSPITAL_COMMUNITY): Payer: Self-pay | Admitting: Licensed Clinical Social Worker

## 2024-05-26 NOTE — Telephone Encounter (Signed)
 Called patient to sch f/u appt, pt stated he can't afford to pay for the visits, I told the patient I would reach out the social worker to see if she can help with his situation. Please advise

## 2024-05-26 NOTE — Telephone Encounter (Signed)
 CSW attempted to call pt to discuss concerns with cost of coming to MD appt- unable to reach -left VM requesting return call  Andriette HILARIO Leech, LCSW Clinical Social Worker Advanced [facility] Failure Clinic Desk#: [phone] Cell#: [phone]

## 2024-06-02 ENCOUNTER — Telehealth (HOSPITAL_COMMUNITY): Payer: Self-pay | Admitting: Licensed Clinical Social Worker

## 2024-06-02 NOTE — Telephone Encounter (Signed)
 CSW attempted to reach out to patient again regarding concerns with paying for MD visits but unable to reach and all numbers on file were non working numbers.  CSW sending letter requesting pt call me directly and will follow up regarding concerns if he reaches out  Dimitris Shanahan H. Courtney Bellizzi, LCSW Clinical Social Worker [facility] Desk#: [phone] Cell#: [phone]

## 2024-07-20 ENCOUNTER — Encounter (HOSPITAL_COMMUNITY): Payer: Self-pay

## 2024-07-20 ENCOUNTER — Other Ambulatory Visit: Payer: Self-pay

## 2024-07-20 ENCOUNTER — Emergency Department (HOSPITAL_COMMUNITY)
Admission: EM | Admit: 2024-07-20 | Discharge: 2024-07-20 | Disposition: A | Discharge status: Home / Self Care | Payer: Self-pay | Attending: Emergency Medicine | Admitting: Emergency Medicine

## 2024-07-20 ENCOUNTER — Emergency Department (HOSPITAL_COMMUNITY): Payer: Self-pay

## 2024-07-20 ENCOUNTER — Other Ambulatory Visit (HOSPITAL_COMMUNITY): Payer: Self-pay

## 2024-07-20 DIAGNOSIS — R600 Localized edema: Secondary | ICD-10-CM | POA: Insufficient documentation

## 2024-07-20 DIAGNOSIS — I509 Heart failure, unspecified: Secondary | ICD-10-CM | POA: Insufficient documentation

## 2024-07-20 DIAGNOSIS — Z79899 Other long term (current) drug therapy: Secondary | ICD-10-CM | POA: Insufficient documentation

## 2024-07-20 DIAGNOSIS — R7989 Other specified abnormal findings of blood chemistry: Secondary | ICD-10-CM | POA: Insufficient documentation

## 2024-07-20 DIAGNOSIS — R0602 Shortness of breath: Secondary | ICD-10-CM | POA: Insufficient documentation

## 2024-07-20 DIAGNOSIS — I5023 Acute on chronic systolic (congestive) heart failure: Secondary | ICD-10-CM

## 2024-07-20 DIAGNOSIS — I110 Hypertensive heart disease with heart failure: Secondary | ICD-10-CM | POA: Insufficient documentation

## 2024-07-20 LAB — CBC WITH DIFFERENTIAL/PLATELET
Abs Immature Granulocytes: 0.03 K/uL (ref 0.00–0.07)
Basophils Absolute: 0 K/uL (ref 0.0–0.1)
Basophils Relative: 0 %
Eosinophils Absolute: 0.1 K/uL (ref 0.0–0.5)
Eosinophils Relative: 2 %
HCT: 34.5 % — ABNORMAL LOW (ref 39.0–52.0)
Hemoglobin: 10.9 g/dL — ABNORMAL LOW (ref 13.0–17.0)
Immature Granulocytes: 1 %
Lymphocytes Relative: 22 %
Lymphs Abs: 1.3 K/uL (ref 0.7–4.0)
MCH: 28.2 pg (ref 26.0–34.0)
MCHC: 31.6 g/dL (ref 30.0–36.0)
MCV: 89.1 fL (ref 80.0–100.0)
Monocytes Absolute: 0.3 K/uL (ref 0.1–1.0)
Monocytes Relative: 5 %
Neutro Abs: 4.4 K/uL (ref 1.7–7.7)
Neutrophils Relative %: 70 %
Platelets: 246 K/uL (ref 150–400)
RBC: 3.87 MIL/uL — ABNORMAL LOW (ref 4.22–5.81)
RDW: 17.2 % — ABNORMAL HIGH (ref 11.5–15.5)
WBC: 6.2 K/uL (ref 4.0–10.5)
nRBC: 0 % (ref 0.0–0.2)

## 2024-07-20 LAB — BASIC METABOLIC PANEL WITH GFR
Anion gap: 10 (ref 5–15)
BUN: 18 mg/dL (ref 6–20)
CO2: 20 mmol/L — ABNORMAL LOW (ref 22–32)
Calcium: 8.7 mg/dL — ABNORMAL LOW (ref 8.9–10.3)
Chloride: 106 mmol/L (ref 98–111)
Creatinine, Ser: 1.56 mg/dL — ABNORMAL HIGH (ref 0.61–1.24)
GFR, Estimated: 55 mL/min — ABNORMAL LOW (ref >60)
Glucose, Bld: 197 mg/dL — ABNORMAL HIGH (ref 70–99)
Potassium: 3.6 mmol/L (ref 3.5–5.1)
Sodium: 136 mmol/L (ref 135–145)

## 2024-07-20 LAB — RESP PANEL BY RT-PCR (RSV, FLU A&B, COVID)  RVPGX2
Influenza A by PCR: NEGATIVE
Influenza B by PCR: NEGATIVE
Resp Syncytial Virus by PCR: NEGATIVE
SARS Coronavirus 2 by RT PCR: NEGATIVE

## 2024-07-20 LAB — TROPONIN I (HIGH SENSITIVITY)
Troponin I (High Sensitivity): 88 ng/L — ABNORMAL HIGH (ref <18)
Troponin I (High Sensitivity): 98 ng/L — ABNORMAL HIGH (ref <18)

## 2024-07-20 LAB — BRAIN NATRIURETIC PEPTIDE: B Natriuretic Peptide: 1120.4 pg/mL — ABNORMAL HIGH (ref 0.0–100.0)

## 2024-07-20 MED ORDER — SPIRONOLACTONE 12.5 MG HALF TABLET
25.0000 mg | ORAL_TABLET | Freq: Every day | ORAL | Status: DC
  Administered 2024-07-20: 25 mg via ORAL
  Filled 2024-07-20: qty 2

## 2024-07-20 MED ORDER — TORSEMIDE 20 MG PO TABS
40.0000 mg | ORAL_TABLET | Freq: Every day | ORAL | 0 refills | Status: DC
  Filled 2024-07-20: qty 60, 30d supply, fill #0

## 2024-07-20 MED ORDER — FUROSEMIDE 10 MG/ML IJ SOLN
40.0000 mg | Freq: Once | INTRAMUSCULAR | Status: DC
  Filled 2024-07-20: qty 4

## 2024-07-20 MED ORDER — GABAPENTIN 300 MG PO CAPS
300.0000 mg | ORAL_CAPSULE | Freq: Every day | ORAL | 0 refills | Status: AC | PRN
  Filled 2024-07-20: qty 30, 30d supply, fill #0

## 2024-07-20 MED ORDER — SPIRONOLACTONE 25 MG PO TABS
25.0000 mg | ORAL_TABLET | Freq: Every day | ORAL | 0 refills | Status: DC
  Filled 2024-07-20: qty 30, 30d supply, fill #0

## 2024-07-20 MED ORDER — CARVEDILOL 12.5 MG PO TABS
12.5000 mg | ORAL_TABLET | Freq: Two times a day (BID) | ORAL | Status: DC
  Administered 2024-07-20: 12.5 mg via ORAL
  Filled 2024-07-20: qty 1

## 2024-07-20 MED ORDER — TORSEMIDE 20 MG PO TABS
40.0000 mg | ORAL_TABLET | Freq: Every day | ORAL | Status: DC
  Administered 2024-07-20: 40 mg via ORAL
  Filled 2024-07-20: qty 2

## 2024-07-20 MED ORDER — IRBESARTAN 150 MG PO TABS
150.0000 mg | ORAL_TABLET | Freq: Every day | ORAL | Status: DC
  Administered 2024-07-20: 150 mg via ORAL
  Filled 2024-07-20: qty 1

## 2024-07-20 MED ORDER — CARVEDILOL 12.5 MG PO TABS
12.5000 mg | ORAL_TABLET | Freq: Two times a day (BID) | ORAL | 0 refills | Status: DC
  Filled 2024-07-20: qty 60, 30d supply, fill #0

## 2024-07-20 MED ORDER — DAPAGLIFLOZIN PROPANEDIOL 10 MG PO TABS
10.0000 mg | ORAL_TABLET | Freq: Every day | ORAL | 0 refills | Status: DC
  Filled 2024-07-20: qty 30, 30d supply, fill #0

## 2024-07-20 MED ORDER — VALSARTAN 160 MG PO TABS
160.0000 mg | ORAL_TABLET | Freq: Two times a day (BID) | ORAL | 0 refills | Status: DC
  Filled 2024-07-20: qty 60, 30d supply, fill #0

## 2024-07-20 MED ORDER — ACETAMINOPHEN 325 MG PO TABS
650.0000 mg | ORAL_TABLET | Freq: Once | ORAL | Status: AC
  Administered 2024-07-20: 650 mg via ORAL
  Filled 2024-07-20: qty 2

## 2024-07-20 NOTE — Progress Notes (Signed)
   07/20/24 1519  TOC Brief Assessment  Insurance and Status Lapsed  Patient has primary care physician No (Appts made at [facility] 07/22/2024 @ 0930; [facility] 07/28/2024 @ 0900.)  Home environment has been reviewed From home  Prior level of function: Independent  Prior/Current Home Services No current home services  Social Drivers of Health Review SDOH reviewed interventions complete (smoking cessation added to AVS)  Readmission risk has been reviewed Yes  Transition of care needs no transition of care needs at this time

## 2024-07-20 NOTE — ED Provider Notes (Signed)
 Jeremy Hood EMERGENCY DEPARTMENT AT [facility] Provider Note   CSN: [id number] Arrival date & time: 07/20/24  1253     Patient presents with: Shortness of Breath and Chest Pain   Jeremy Hood is a 48 y.o. male.   48 yo M with a chief complaints of difficulty breathing.  Going on for about a week or so.  Has run out of all of his home medications with about the same amount of time.  He has tried to follow-up but was unable to afford the appointments.  Feels like he has too much fluid on him.  He has required admission for this in the past.   Shortness of Breath Associated symptoms: chest pain   Chest Pain Associated symptoms: shortness of breath        Prior to Admission medications   Medication Sig Start Date End Date Taking? Authorizing Provider  carvedilol  (COREG ) 12.5 MG tablet Take 1 tablet (12.5 mg total) by mouth 2 (two) times daily with a meal. 07/20/24   Emil Share, DO  empagliflozin  (JARDIANCE ) 10 MG TABS tablet Take 1 tablet (10 mg total) by mouth daily. 07/20/24   Emil Share, DO  ferrous sulfate  325 (65 FE) MG tablet Take 1 tablet (325 mg total) by mouth daily with breakfast. 05/07/24   Elnora Ip, MD  gabapentin (NEURONTIN) 300 MG capsule Take 1 capsule (300 mg total) by mouth daily as needed (pain, neuropathy). 07/20/24 08/19/24  Emil Share, DO  hydrALAZINE  (APRESOLINE ) 25 MG tablet Take 1 tablet (25 mg total) by mouth every 8 (eight) hours. 05/06/24 06/05/24  Elnora Ip, MD  spironolactone  (ALDACTONE ) 25 MG tablet Take 1 tablet (25 mg total) by mouth daily. 07/20/24   Emil Share, DO  torsemide  (DEMADEX ) 20 MG tablet Take 2 tablets (40 mg total) by mouth daily. 07/20/24   Emil Share, DO  valsartan  (DIOVAN ) 160 MG tablet Take 1 tablet (160 mg total) by mouth 2 (two) times daily. 07/20/24   Emil Share, DO  lisinopril  (ZESTRIL ) 10 MG tablet Take 1 tablet (10 mg total) by mouth daily. 03/15/20 02/09/21  Hall-Potvin, [place], PA-C     Allergies: Zestril  [lisinopril ]    Review of Systems  Respiratory:  Positive for shortness of breath.   Cardiovascular:  Positive for chest pain.    Updated Vital Signs BP (!) 172/108 (BP Location: Right Arm)   Pulse 84   Temp 98.3 F (36.8 C) (Oral)   Resp 18   Ht 5' 11 (1.803 m)   Wt 133.4 kg   SpO2 100%   BMI 41.00 kg/m   Physical Exam Vitals and nursing note reviewed.  Constitutional:      Appearance: He is well-developed.  HENT:     Head: Normocephalic and atraumatic.  Eyes:     Pupils: Pupils are equal, round, and reactive to light.  Neck:     Vascular: No JVD.  Cardiovascular:     Rate and Rhythm: Normal rate and regular rhythm.     Heart sounds: No murmur heard.    No friction rub. No gallop.  Pulmonary:     Effort: No respiratory distress.     Breath sounds: No wheezing.  Abdominal:     General: There is no distension.     Tenderness: There is no abdominal tenderness. There is no guarding or rebound.  Musculoskeletal:        General: Normal range of motion.     Cervical back: Normal range of motion and neck  supple.     Right lower leg: Edema present.     Left lower leg: Edema present.  Skin:    Coloration: Skin is not pale.     Findings: No rash.  Neurological:     Mental Status: He is alert and oriented to person, place, and time.  Psychiatric:        Behavior: Behavior normal.     (all labs ordered are listed, but only abnormal results are displayed) Labs Reviewed  BASIC METABOLIC PANEL WITH GFR - Abnormal; Notable for the following components:      Result Value   CO2 20 (*)    Glucose, Bld 197 (*)    Creatinine, Ser 1.56 (*)    Calcium 8.7 (*)    GFR, Estimated 55 (*)    All other components within normal limits  CBC WITH DIFFERENTIAL/PLATELET - Abnormal; Notable for the following components:   RBC 3.87 (*)    Hemoglobin 10.9 (*)    HCT 34.5 (*)    RDW 17.2 (*)    All other components within normal limits  BRAIN NATRIURETIC  PEPTIDE - Abnormal; Notable for the following components:   B Natriuretic Peptide 1,120.4 (*)    All other components within normal limits  TROPONIN I (HIGH SENSITIVITY) - Abnormal; Notable for the following components:   Troponin I (High Sensitivity) 88 (*)    All other components within normal limits  RESP PANEL BY RT-PCR (RSV, FLU A&B, COVID)  RVPGX2  TROPONIN I (HIGH SENSITIVITY)    EKG: EKG Interpretation Date/Time:  Wednesday July 20 2024 13:57:51 EDT Ventricular Rate:  87 PR Interval:  155 QRS Duration:  164 QT Interval:  432 QTC Calculation: 520 R Axis:   -86  Text Interpretation: Sinus rhythm Probable left atrial enlargement RBBB and LAFB Abnormal T, consider ischemia, lateral leads No significant change since last tracing Confirmed by Emil Share [phone]) on 07/20/2024 2:55:21 PM  Radiology: DG Chest 2 View Result Date: 07/20/2024 CLINICAL DATA:  Shortness of breath in patient with history of congestive heart failure. Out of medication for approximately 1 week. Patient complains of tightness and chest pain. EXAM: CHEST - 2 VIEW COMPARISON:  Two views chest x-ray 05/28/2024 FINDINGS: Prominent cardiac size and diffuse bilateral airspace opacities predominantly in the lung bases and consistent with pulmonary edema, but improved when compared with previous study. No pleural effusion or pneumothorax. The visualized skeletal structures are unremarkable. IMPRESSION: Findings consistent with pulmonary edema although somewhat improved compared with the previous study. Electronically Signed   By: Cordella Banner   On: 07/20/2024 13:51     Procedures   Medications Ordered in the ED  carvedilol  (COREG ) tablet 12.5 mg (12.5 mg Oral Given 07/20/24 1502)  irbesartan  (AVAPRO ) tablet 150 mg (150 mg Oral Given 07/20/24 1502)  spironolactone  (ALDACTONE ) tablet 25 mg (25 mg Oral Given 07/20/24 1502)  torsemide  (DEMADEX ) tablet 40 mg (40 mg Oral Given 07/20/24 1530)                                     Medical Decision Making Risk Prescription drug management.   48 yo M with a chief complaints of difficulty breathing going on for about a week.  Has a history of CHF not on his medications due to cost.  No acute anemia no significant electrolyte abnormalities.  BNP is mildly elevated.  Troponin likely due to demand lower than last  check.  Will give a bolus dose of Lasix  here.   Nursing struggling with IV access.  Patient requesting oral medications.  Will give a double dose of his torsemide  here.  Social work able to establish follow-up appointments with both IM and heart failure clinic.  Awaiting second troponin.  3:47 PM:  I have discussed the diagnosis/risks/treatment options with the patient and family.  Evaluation and diagnostic testing in the emergency department does not suggest an emergent condition requiring admission or immediate intervention beyond what has been performed at this time.  They will follow up with PCP, cards. We also discussed returning to the ED immediately if new or worsening sx occur. We discussed the sx which are most concerning (e.g., sudden worsening pain, fever, inability to tolerate by mouth, worsening sob) that necessitate immediate return. Medications administered to the patient during their visit and any new prescriptions provided to the patient are listed below.  Medications given during this visit Medications  carvedilol  (COREG ) tablet 12.5 mg (12.5 mg Oral Given 07/20/24 1502)  irbesartan  (AVAPRO ) tablet 150 mg (150 mg Oral Given 07/20/24 1502)  spironolactone  (ALDACTONE ) tablet 25 mg (25 mg Oral Given 07/20/24 1502)  torsemide  (DEMADEX ) tablet 40 mg (40 mg Oral Given 07/20/24 1530)     The patient appears reasonably screen and/or stabilized for discharge and I doubt any other medical condition or other [facility] requiring further screening, evaluation, or treatment in the ED at this time prior to discharge.       Final diagnoses:  None     ED Discharge Orders          Ordered    carvedilol  (COREG ) 12.5 MG tablet  2 times daily with meals        07/20/24 1519    empagliflozin  (JARDIANCE ) 10 MG TABS tablet  Daily        07/20/24 1519    gabapentin (NEURONTIN) 300 MG capsule  Daily PRN        07/20/24 1519    spironolactone  (ALDACTONE ) 25 MG tablet  Daily        07/20/24 1519    torsemide  (DEMADEX ) 20 MG tablet  Daily        07/20/24 1519    valsartan  (DIOVAN ) 160 MG tablet  2 times daily        07/20/24 1519               Emil Share, DO 07/20/24 1547

## 2024-07-20 NOTE — TOC CM/SW Note (Signed)
 Spoke c/Jenna, SW at [facility]. There are programs available at the clinic to assist pt c/visits, medications and transportation if needed. Made pt an appt at the clinic for Friday, Jul 22, 2024 at 0930 c/Benjamin Tonasket, MD. Updated pt and his wife, who is at the bedside, on the appt and instructed them to speak c/Jenna while at the clinic this Friday. Verified pt's contact information in the chart is correct. Pt's preferred method of communication is a text at [phone].

## 2024-07-20 NOTE — ED Provider Triage Note (Signed)
 Emergency Medicine Provider Triage Evaluation Note  BARNABAS HENRIQUES , a 48 y.o. male  was evaluated in triage.  Pt complains of SHOB, central chest tightness. No lower ext edema. Hx CHF, in the hospital about a month ago for same. Ran out of meds 1 week ago.   Review of Systems  Positive: CP, SHOB Negative: Fever, lower ext edema   Physical Exam  There were no vitals taken for this visit. Gen:   Awake, no distress   Resp:  Normal effort  MSK:   Moves extremities without difficulty  Other:  Diminished lung sounds in bases   Medical Decision Making  Medically screening exam initiated at 1:01 PM.  Appropriate orders placed.  [name] was informed that the remainder of the evaluation will be completed by another provider, this initial triage assessment does not replace that evaluation, and the importance of remaining in the ED until their evaluation is complete.     Beverley Leita LABOR, PA-C 07/20/24 1302

## 2024-07-20 NOTE — ED Notes (Signed)
 Called and placed PT on monitor with CCMD

## 2024-07-20 NOTE — ED Provider Notes (Signed)
 Patient feels much improved.  No distress.  Lung sounds clear. He has an established CHF appointment this Friday.  All meds have been sent to the transition of care pharmacy.  Patient and wife are agreeable with plan   Midge Golas, MD 07/20/24 1724

## 2024-07-20 NOTE — ED Triage Notes (Signed)
 Patient c/o chest tightness, chest pain, and SHOB x 3 days, progressively getting worse, been out of meds for a week including HF and BP. Patient is HTN and tachypnic in triage.

## 2024-07-20 NOTE — TOC CM/SW Note (Signed)
 Pharmacy [facility] card provided for dc Rx.   MATCH MEDICATION ASSISTANCE CARD Pharmacies please call [phone] for claim processing assistance.  Rx BIN: [id number] Rx Group: [id number] Rx PCN: [id number] Relationship Code: 1 Person Code: 01  Patient ID (MRN): [id number]   Patient Name: Jeremy Hood  Patient DOB: 1976-01-21   Discharge Date: 07/20/2024  Expiration Date: 07/27/24 (must be filled within 7 days of discharge)

## 2024-07-20 NOTE — ED Notes (Addendum)
 Pt is leaving for phamracy with wife,then home, paper work reviewed with pt.  Pt is in no new onset distress at this time.

## 2024-07-20 NOTE — Discharge Instructions (Addendum)
  Friday, July 22, 2024 at 9:30 AM Dr. Morene Brownie [facility] at [facility] Use entrance [address] on [facility] Parking code for October: 1430 ** Ask to speak with Jenna (Child psychotherapist) before you leave. She can assist with resources for future visits and medications ** Call to reschedule or cancel if you can not make this appointment [phone]  This is also a good option for primary care: [facility]) [address] [place], [place] Monday - Friday 9 AM - 6 PM Walk-in Clinic Monday - Thursday 9 AM - 10:30 AM [phone] or [phone] *This clinic sees patients with or without insurance*

## 2024-07-21 ENCOUNTER — Telehealth (HOSPITAL_COMMUNITY): Payer: Self-pay

## 2024-07-21 NOTE — Telephone Encounter (Signed)
 Called to confirm/remind patient of their appointment at the [facility] on 07/22/24.   Appointment:   [] Confirmed  [x] Left mess   [] No answer/No voice mail  [] VM Full/unable to leave message  [] Phone not in service  And to bring in all medications and/or complete list.

## 2024-07-22 ENCOUNTER — Ambulatory Visit (HOSPITAL_COMMUNITY)
Admit: 2024-07-22 | Discharge: 2024-07-22 | Disposition: A | Discharge status: Home / Self Care | Payer: Self-pay | Attending: Family Medicine | Admitting: Family Medicine

## 2024-07-22 ENCOUNTER — Telehealth (HOSPITAL_COMMUNITY): Payer: Self-pay

## 2024-07-22 ENCOUNTER — Encounter (HOSPITAL_COMMUNITY): Payer: Self-pay

## 2024-07-22 ENCOUNTER — Other Ambulatory Visit (HOSPITAL_COMMUNITY): Payer: Self-pay

## 2024-07-22 ENCOUNTER — Ambulatory Visit (HOSPITAL_COMMUNITY): Payer: Self-pay | Admitting: Family Medicine

## 2024-07-22 VITALS — BP 144/90 | HR 72 | Ht 71.0 in | Wt 303.2 lb

## 2024-07-22 DIAGNOSIS — D509 Iron deficiency anemia, unspecified: Secondary | ICD-10-CM

## 2024-07-22 DIAGNOSIS — Z5971 Insufficient health insurance coverage: Secondary | ICD-10-CM | POA: Insufficient documentation

## 2024-07-22 DIAGNOSIS — F1910 Other psychoactive substance abuse, uncomplicated: Secondary | ICD-10-CM

## 2024-07-22 DIAGNOSIS — F1490 Cocaine use, unspecified, uncomplicated: Secondary | ICD-10-CM | POA: Insufficient documentation

## 2024-07-22 DIAGNOSIS — Z139 Encounter for screening, unspecified: Secondary | ICD-10-CM

## 2024-07-22 DIAGNOSIS — F1290 Cannabis use, unspecified, uncomplicated: Secondary | ICD-10-CM | POA: Insufficient documentation

## 2024-07-22 DIAGNOSIS — Z59868 Other specified financial insecurity: Secondary | ICD-10-CM | POA: Insufficient documentation

## 2024-07-22 DIAGNOSIS — E8770 Fluid overload, unspecified: Secondary | ICD-10-CM | POA: Insufficient documentation

## 2024-07-22 DIAGNOSIS — Z79899 Other long term (current) drug therapy: Secondary | ICD-10-CM | POA: Insufficient documentation

## 2024-07-22 DIAGNOSIS — R0683 Snoring: Secondary | ICD-10-CM | POA: Insufficient documentation

## 2024-07-22 DIAGNOSIS — I452 Bifascicular block: Secondary | ICD-10-CM | POA: Insufficient documentation

## 2024-07-22 DIAGNOSIS — F17210 Nicotine dependence, cigarettes, uncomplicated: Secondary | ICD-10-CM | POA: Insufficient documentation

## 2024-07-22 DIAGNOSIS — I10 Essential (primary) hypertension: Secondary | ICD-10-CM

## 2024-07-22 DIAGNOSIS — I110 Hypertensive heart disease with heart failure: Secondary | ICD-10-CM | POA: Insufficient documentation

## 2024-07-22 DIAGNOSIS — Z91148 Patient's other noncompliance with medication regimen for other reason: Secondary | ICD-10-CM | POA: Insufficient documentation

## 2024-07-22 DIAGNOSIS — Z7984 Long term (current) use of oral hypoglycemic drugs: Secondary | ICD-10-CM | POA: Insufficient documentation

## 2024-07-22 DIAGNOSIS — F1990 Other psychoactive substance use, unspecified, uncomplicated: Secondary | ICD-10-CM | POA: Insufficient documentation

## 2024-07-22 DIAGNOSIS — R42 Dizziness and giddiness: Secondary | ICD-10-CM | POA: Insufficient documentation

## 2024-07-22 DIAGNOSIS — I5042 Chronic combined systolic (congestive) and diastolic (congestive) heart failure: Secondary | ICD-10-CM | POA: Insufficient documentation

## 2024-07-22 DIAGNOSIS — I5022 Chronic systolic (congestive) heart failure: Secondary | ICD-10-CM

## 2024-07-22 DIAGNOSIS — I454 Nonspecific intraventricular block: Secondary | ICD-10-CM | POA: Insufficient documentation

## 2024-07-22 LAB — BASIC METABOLIC PANEL WITH GFR
Anion gap: 13 (ref 5–15)
BUN: 26 mg/dL — ABNORMAL HIGH (ref 6–20)
CO2: 26 mmol/L (ref 22–32)
Calcium: 8.6 mg/dL — ABNORMAL LOW (ref 8.9–10.3)
Chloride: 100 mmol/L (ref 98–111)
Creatinine, Ser: 1.95 mg/dL — ABNORMAL HIGH (ref 0.61–1.24)
GFR, Estimated: 42 mL/min — ABNORMAL LOW (ref >60)
Glucose, Bld: 102 mg/dL — ABNORMAL HIGH (ref 70–99)
Potassium: 3.6 mmol/L (ref 3.5–5.1)
Sodium: 139 mmol/L (ref 135–145)

## 2024-07-22 LAB — BRAIN NATRIURETIC PEPTIDE: B Natriuretic Peptide: 262.6 pg/mL — ABNORMAL HIGH (ref 0.0–100.0)

## 2024-07-22 MED ORDER — VALSARTAN 160 MG PO TABS: 160.0000 mg | ORAL_TABLET | Freq: Two times a day (BID) | ORAL | Status: AC

## 2024-07-22 MED ORDER — SPIRONOLACTONE 25 MG PO TABS
25.0000 mg | ORAL_TABLET | Freq: Every day | ORAL | 11 refills | Status: AC
  Filled 2024-07-22 – 2024-11-18 (×4): qty 30, 30d supply, fill #0

## 2024-07-22 MED ORDER — DAPAGLIFLOZIN PROPANEDIOL 10 MG PO TABS
10.0000 mg | ORAL_TABLET | Freq: Every day | ORAL | 11 refills | Status: AC
  Filled 2024-07-22: qty 30, 30d supply, fill #0

## 2024-07-22 MED ORDER — TORSEMIDE 20 MG PO TABS
40.0000 mg | ORAL_TABLET | Freq: Every day | ORAL | 11 refills | Status: DC
Start: 2024-07-22 — End: 2024-08-15
  Filled 2024-07-22: qty 60, 30d supply, fill #0

## 2024-07-22 MED ORDER — CARVEDILOL 12.5 MG PO TABS
12.5000 mg | ORAL_TABLET | Freq: Two times a day (BID) | ORAL | 11 refills | Status: AC
  Filled 2024-07-22 – 2024-11-04 (×4): qty 60, 30d supply, fill #0

## 2024-07-22 NOTE — Progress Notes (Signed)
 [facility] CONSULT NOTE  Primary Care: Patient, No Pcp Per HF Cardiologist: Dr. Zenaida  HPI: 48 y.o. AAM w/ h/o untreated & uncontrolled HTN, diastolic heart failure and polysubstance use. H/o angioedema w/ ACEi. Newly diagnosed systolic heart failure 04/2024.   He was admitted in 2018 for chest pain and acute diastolic heart failure. He was also found to be cocaine + on admit. Echo then showed normal LVEF 55-60%, severe LVH w/ GIDD, RV nl. NST interpreted a mild area of reversibility anteriorly concerning for ischemia, however on further investigation, anterior attenuation was felt to be 2/2 chest wall artifact. Chest pain and trop bump felt likely 2/2 demand ischemia from CHF and HTN. Did not get LHC. Treated medically but failed to f/u w/ cardiology.    Admitted 7/25 with new systolic HF and HTN. Not taking meds for HTN. Uses cocaine, ETOH and THC. Echo showed EF 30-35%, mod LVH, GIIDD, mod LAE, mod MR, RV mod reduced. Diuresed with IV lasix . cMRI showed LVEF 26%, RVEF 51%, LGE pattern could be seen in HCM but could also represent genetic (arrhythmogenic) or infiltrative cardiomyopathy. Cannot exclude cardiac sarcoid. Unable to get PET due to being uninsured. GDMT titrated. He was discharged home, weight 294 lbs.   Seen in ED 07/20/24 for SOB and CP. Not on meds. Given IV lasix .   Today he returns for post hospital HF follow up. Overall feeling fine. Had been out of meds x 1 week, but back on meds for the past day and can feel improvement. Has occasional dizziness. Denies palpitations, abnormal bleeding, CP, edema, or PND/Orthopnea. Appetite ok. Weight at home 300 pounds. Taking all medications. Works at [employer]. He snores. No ETOH, smokes THC and tobacco (1 pack per week), last used cocaine months ago.  Past Medical History:  Diagnosis Date   Chest pain 11/2016   Hypertension    Current Outpatient Medications  Medication Sig Dispense Refill   carvedilol  (COREG ) 12.5  MG tablet Take 1 tablet (12.5 mg total) by mouth 2 (two) times daily with a meal. 60 tablet 0   dapagliflozin propanediol (FARXIGA) 10 MG TABS tablet Take 1 tablet (10 mg total) by mouth daily. 30 tablet 0   gabapentin (NEURONTIN) 300 MG capsule Take 1 capsule (300 mg total) by mouth daily as needed (pain, neuropathy). 30 capsule 0   spironolactone  (ALDACTONE ) 25 MG tablet Take 1 tablet (25 mg total) by mouth daily. 30 tablet 0   torsemide  (DEMADEX ) 20 MG tablet Take 2 tablets (40 mg total) by mouth daily. 60 tablet 0   valsartan  (DIOVAN ) 160 MG tablet Take 1 tablet (160 mg total) by mouth 2 (two) times daily. 60 tablet 0   ferrous sulfate  325 (65 FE) MG tablet Take 1 tablet (325 mg total) by mouth daily with breakfast. (Patient not taking: Reported on 07/22/2024) 30 tablet 3   hydrALAZINE  (APRESOLINE ) 25 MG tablet Take 1 tablet (25 mg total) by mouth every 8 (eight) hours. (Patient not taking: Reported on 07/22/2024) 90 tablet 0   No current facility-administered medications for this encounter.    Allergies  Allergen Reactions   Zestril  [Lisinopril ] Swelling    Angioedema with ACEI  Tolerated Losartan  without issue    Social History   Socioeconomic History   Marital status: Married    Spouse name: Not on file   Number of children: 2   Years of education: Not on file   Highest education level: High school graduate  Occupational History  Occupation: [employer]  Tobacco Use   Smoking status: Every Day    Current packs/day: 0.00    Average packs/day: 0.5 packs/day for 20.0 years (10.0 ttl pk-yrs)    Types: Cigarettes    Start date: 11/23/1996    Last attempt to quit: 11/23/2016    Years since quitting: 7.6   Smokeless tobacco: Never  Vaping Use   Vaping status: Never Used  Substance and Sexual Activity   Alcohol  use: Not Currently   Drug use: Yes    Types: Marijuana    Comment: everyday   Sexual activity: Not Currently  Other Topics Concern   Not on file  Social History  Narrative   Not on file   Social Drivers of Health   Financial Resource Strain: High Risk (04/29/2024)   Overall Financial Resource Strain (CARDIA)    Difficulty of Paying Living Expenses: Hard  Food Insecurity: No Food Insecurity (04/27/2024)   Hunger Vital Sign    Worried About Running Out of Food in the Last Year: Never true    Ran Out of Food in the Last Year: Never true  Transportation Needs: No Transportation Needs (04/27/2024)   PRAPARE - Administrator, Civil Service (Medical): No    Lack of Transportation (Non-Medical): No  Physical Activity: Not on file  Stress: Not on file  Social Connections: Moderately Isolated (04/27/2024)   Social Connection and Isolation Panel    Frequency of Communication with Friends and Family: More than three times a week    Frequency of Social Gatherings with Friends and Family: Once a week    Attends Religious Services: Never    Database administrator or Organizations: No    Attends Banker Meetings: Never    Marital Status: Married  Catering manager Violence: Not At Risk (04/27/2024)   Humiliation, Afraid, Rape, and Kick questionnaire    Fear of Current or Ex-Partner: No    Emotionally Abused: No    Physically Abused: No    Sexually Abused: No   Family History  Problem Relation Age of Onset   Hypertension Mother    Diabetes Mother    Wt Readings from Last 3 Encounters:  07/22/24 (!) 137.5 kg (303 lb 3.2 oz)  07/20/24 133.4 kg (294 lb)  05/06/24 132 kg (291 lb 0.1 oz)   BP (!) 144/90   Pulse 72   Ht 5' 11 (1.803 m)   Wt (!) 137.5 kg (303 lb 3.2 oz)   SpO2 99%   BMI 42.29 kg/m   PHYSICAL EXAM: General:  NAD. No resp difficulty, walked into clinic HEENT: Normal Neck: Supple. JVP 10-12 Cor: Regular rate & rhythm. No rubs, gallops or murmurs. Lungs: Clear Abdomen: Soft, obese, nontender, nondistended.  Extremities: No cyanosis, clubbing, rash, edema Neuro: Alert & oriented x 3, moves all 4 extremities w/o  difficulty. Affect pleasant.  ECG (personally reviewed): NSR, LBBB   ASSESSMENT & PLAN: 1. Chronic Systolic Heart Failure - Suspect most likely HTN CM but will need to r/o infiltrative process.  - Echo 2018 EF 60-65%, severe LVH w/ GIDD, RV ok - Echo this admit, EF now 30-35%, mod LVH, RV mod reduced  - cMRI 05/03/24: LVEF 26%, RVEF 51%, LGE pattern could be seen in HCM but could also represent genetic (arrhythmogenic) or infiltrative cardiomyopathy. Cannot exclude cardiac sarcoid. - Unable to obtain cardiac PET for sarcoid workup until he has insurance. Will arrange genetic testing today - NYHA II-early III, he is mildly volume  overloaded but just started back on meds.  - Continue torsemide  40 mg daily - Continue valsartan  160 mg bid (no Entresto with angioedema with ACEi). - Continue spironolactone  25 mg daily  - Continue coreg  12.5 mg bid  - Continue Jardiance  10 mg daily. - Add back hydralazine  next visit if dizziness has resolved. - imperative to quit cocaine  - Labs today.   2. Hypertension  - BP improving  - HF GDMT titration per above  - No evidence of renal artery stenosis on dopplers - Arrange sleep study once he gets insurance   3. Polysubstance use - advised to quit cocaine and THC    4. IDA  - tsat 11 - ? If 2/2 hemorrhoidal bleeding  - given IV Fe - will need PCP and outpatient w/u   5. SDOH - Needs PCP - Meds thru HF fund - Engage HFSW for insurance resources  Follow up in 3 weeks with APP.  Harlene Gainer, FNP-BC 07/22/24

## 2024-07-22 NOTE — Telephone Encounter (Signed)
 Advanced [facility] Failure Patient Advocate Encounter  Application for Farxiga faxed to [place] on 07/22/2024. Application form attached to patient chart.  Rachel DEL, CPhT Rx Patient Advocate Phone: [phone]

## 2024-07-22 NOTE — Patient Instructions (Signed)
 No change in medications. Heart failure medications sent to [facility] with request to use Heart Failure fund to pay. Also requested they be mailed to you. Please call the number below to confirm mailing date. Labs today - will call you if abnormal. Genetic testing has been collected, this has to be sent to [place]  for processing and can take 1-2 weeks for us  to get results back.  We will let you know the results once reviewed by your provider.  4.   Please obtain a primary care physician - list provided. 5.   Return to Heart Failure APP Clinic in 3 weeks - see below. 6.   Please call us  at [phone] if any questions or concerns prior to your next visit.   Cardiac genetic testing collected via blood per Dr. Zenaida.  Order form completed, signed and shipped with sample by FedEx to [facility].

## 2024-07-22 NOTE — Progress Notes (Signed)
 H&V Care Navigation CSW Progress Note  Clinical Social Worker met with pt and wife to discuss lack of insurance.  Pt works and is not eligible for Medicaid at this time.  Pt previously had been with [facility]W and is aware of [employer]- wants to reapply and get reestablished with [facility]W- provided him with their number to call for appt as well as financial counselor number.  Provided [employer] and [employer]- explained [employer] would provide discount for outstanding cone bills based off his income.  SDOH Screenings   Food Insecurity: No Food Insecurity (04/27/2024)  Housing: Low Risk  (04/27/2024)  Transportation Needs: No Transportation Needs (04/27/2024)  Utilities: Not At Risk (04/27/2024)  Alcohol  Screen: Low Risk  (04/29/2024)  Financial Resource Strain: High Risk (07/22/2024)  Social Connections: Moderately Isolated (04/27/2024)  Tobacco Use: High Risk (07/22/2024)   Will continue to follow and assist as needed  Gordie Belvin H. Jacoya Bauman, LCSW Clinical Social Worker [facility] Desk#: [phone] Cell#: [phone]

## 2024-07-27 NOTE — Telephone Encounter (Signed)
 Patient assistance for Jeremy Hood is currently denied. [place] requires medicaid denial letter before they will reassess this application. Left voicemail for patient.

## 2024-07-28 ENCOUNTER — Ambulatory Visit: Payer: Self-pay

## 2024-08-08 ENCOUNTER — Emergency Department (HOSPITAL_COMMUNITY)
Admission: EM | Admit: 2024-08-08 | Discharge: 2024-08-08 | Discharge status: Against Medical Advice | Payer: Self-pay | Attending: Emergency Medicine | Admitting: Emergency Medicine

## 2024-08-08 ENCOUNTER — Encounter (HOSPITAL_COMMUNITY): Payer: Self-pay

## 2024-08-08 ENCOUNTER — Emergency Department (HOSPITAL_COMMUNITY): Payer: Self-pay

## 2024-08-08 ENCOUNTER — Other Ambulatory Visit: Payer: Self-pay

## 2024-08-08 DIAGNOSIS — Z5321 Procedure and treatment not carried out due to patient leaving prior to being seen by health care provider: Secondary | ICD-10-CM | POA: Insufficient documentation

## 2024-08-08 DIAGNOSIS — M7989 Other specified soft tissue disorders: Secondary | ICD-10-CM | POA: Insufficient documentation

## 2024-08-08 DIAGNOSIS — R0602 Shortness of breath: Secondary | ICD-10-CM | POA: Insufficient documentation

## 2024-08-08 DIAGNOSIS — I509 Heart failure, unspecified: Secondary | ICD-10-CM | POA: Insufficient documentation

## 2024-08-08 DIAGNOSIS — M25522 Pain in left elbow: Secondary | ICD-10-CM | POA: Insufficient documentation

## 2024-08-08 LAB — CBC WITH DIFFERENTIAL/PLATELET
Abs Immature Granulocytes: 0.04 K/uL (ref 0.00–0.07)
Basophils Absolute: 0 K/uL (ref 0.0–0.1)
Basophils Relative: 0 %
Eosinophils Absolute: 0.1 K/uL (ref 0.0–0.5)
Eosinophils Relative: 1 %
HCT: 37.4 % — ABNORMAL LOW (ref 39.0–52.0)
Hemoglobin: 12 g/dL — ABNORMAL LOW (ref 13.0–17.0)
Immature Granulocytes: 0 %
Lymphocytes Relative: 17 %
Lymphs Abs: 1.5 K/uL (ref 0.7–4.0)
MCH: 28.2 pg (ref 26.0–34.0)
MCHC: 32.1 g/dL (ref 30.0–36.0)
MCV: 88 fL (ref 80.0–100.0)
Monocytes Absolute: 0.5 K/uL (ref 0.1–1.0)
Monocytes Relative: 6 %
Neutro Abs: 7 K/uL (ref 1.7–7.7)
Neutrophils Relative %: 76 %
Platelets: 273 K/uL (ref 150–400)
RBC: 4.25 MIL/uL (ref 4.22–5.81)
RDW: 15.7 % — ABNORMAL HIGH (ref 11.5–15.5)
WBC: 9.2 K/uL (ref 4.0–10.5)
nRBC: 0 % (ref 0.0–0.2)

## 2024-08-08 LAB — BASIC METABOLIC PANEL WITH GFR
Anion gap: 11 (ref 5–15)
BUN: 33 mg/dL — ABNORMAL HIGH (ref 6–20)
CO2: 26 mmol/L (ref 22–32)
Calcium: 8.8 mg/dL — ABNORMAL LOW (ref 8.9–10.3)
Chloride: 98 mmol/L (ref 98–111)
Creatinine, Ser: 2.22 mg/dL — ABNORMAL HIGH (ref 0.61–1.24)
GFR, Estimated: 36 mL/min — ABNORMAL LOW (ref >60)
Glucose, Bld: 259 mg/dL — ABNORMAL HIGH (ref 70–99)
Potassium: 4 mmol/L (ref 3.5–5.1)
Sodium: 135 mmol/L (ref 135–145)

## 2024-08-08 MED ORDER — IBUPROFEN 400 MG PO TABS
600.0000 mg | ORAL_TABLET | Freq: Once | ORAL | Status: AC
  Administered 2024-08-08: 600 mg via ORAL
  Filled 2024-08-08: qty 1

## 2024-08-08 NOTE — ED Provider Triage Note (Signed)
 Emergency Medicine Provider Triage Evaluation Note  Jeremy Hood , a 48 y.o. male  was evaluated in triage.  Pt complains of left elbow pain that started 5 days ago.  Pain is worse with movement of the left elbow.  He reports that this started suddenly.  Denies any injuries to the left elbow.  Denies any overlying cuts.  He denies any fevers or chills.  Review of Systems  Positive: As above Negative: As above  Physical Exam  BP (!) 149/86   Pulse 86   Temp 99.1 F (37.3 C)   Resp 18   Ht 5' 11 (1.803 m)   Wt 136.1 kg   SpO2 95%   BMI 41.84 kg/m  Gen:   Awake, no distress   Resp:  Normal effort  MSK:   Moves extremities without difficulty  Other:  Mild edema of the left elbow  Medical Decision Making  Medically screening exam initiated at 5:45 PM.  Appropriate orders placed.  Jeremy Hood was informed that the remainder of the evaluation will be completed by another provider, this initial triage assessment does not replace that evaluation, and the importance of remaining in the ED until their evaluation is complete.     Jeremy Palma, PA-C 08/08/24 1745

## 2024-08-08 NOTE — ED Triage Notes (Signed)
 Patient reports he woke up with pain in his left elbow Thursday morning and now has progressed to swelling, warmth, and redness in that joint. No hx of injury or trauma to the area.

## 2024-08-08 NOTE — ED Triage Notes (Signed)
 PT here for swelling to left arm from elbow to hand and knee.  Concerned may be r/t heart. PT has hx of CHF. PT endorses a little SOB.

## 2024-08-08 NOTE — ED Notes (Signed)
 Pt called for vital reassessment. No response.

## 2024-08-08 NOTE — ED Notes (Signed)
 PT called again x4 for vital reassessment

## 2024-08-11 ENCOUNTER — Emergency Department (HOSPITAL_COMMUNITY)
Admission: EM | Admit: 2024-08-11 | Discharge: 2024-08-11 | Disposition: A | Discharge status: Home / Self Care | Payer: Self-pay | Attending: Emergency Medicine | Admitting: Emergency Medicine

## 2024-08-11 ENCOUNTER — Other Ambulatory Visit: Payer: Self-pay

## 2024-08-11 ENCOUNTER — Emergency Department (HOSPITAL_COMMUNITY): Payer: Self-pay

## 2024-08-11 DIAGNOSIS — Z79899 Other long term (current) drug therapy: Secondary | ICD-10-CM | POA: Insufficient documentation

## 2024-08-11 DIAGNOSIS — M25532 Pain in left wrist: Secondary | ICD-10-CM | POA: Insufficient documentation

## 2024-08-11 DIAGNOSIS — Y93G3 Activity, cooking and baking: Secondary | ICD-10-CM | POA: Insufficient documentation

## 2024-08-11 DIAGNOSIS — M7022 Olecranon bursitis, left elbow: Secondary | ICD-10-CM | POA: Insufficient documentation

## 2024-08-11 MED ORDER — AMOXICILLIN-POT CLAVULANATE 875-125 MG PO TABS: 1.0000 | ORAL_TABLET | Freq: Two times a day (BID) | ORAL | 0 refills | Status: DC

## 2024-08-11 MED ORDER — COLCHICINE 0.6 MG PO TABS: 1.2000 mg | ORAL_TABLET | Freq: Once | ORAL | Status: DC

## 2024-08-11 MED ORDER — HYDROCODONE-ACETAMINOPHEN 5-325 MG PO TABS
1.0000 | ORAL_TABLET | Freq: Once | ORAL | Status: AC
  Administered 2024-08-11: 1 via ORAL
  Filled 2024-08-11: qty 1

## 2024-08-11 MED ORDER — ONDANSETRON 4 MG PO TBDP
4.0000 mg | ORAL_TABLET | Freq: Once | ORAL | Status: AC
  Administered 2024-08-11: 4 mg via ORAL
  Filled 2024-08-11: qty 1

## 2024-08-11 MED ORDER — COLCHICINE 0.6 MG PO TABS: 0.6000 mg | ORAL_TABLET | Freq: Once | ORAL | 0 refills | Status: DC

## 2024-08-11 MED ORDER — PREDNISONE 10 MG (21) PO TBPK: ORAL_TABLET | Freq: Every day | ORAL | 0 refills | Status: DC

## 2024-08-11 NOTE — Discharge Instructions (Addendum)
 Your elbow swelling and pain is likely due to bursitis.  I have sent an antibiotic into the pharmacy for this along with steroids.  The steroids will also help with the gout attack in the left wrist.  We discussed that at times this can appear similar to a bacterial joint infection which is a surgical emergency.  We discussed aspirating fluid from the joint however you declined at this time and state if things get worse you will come back for immediate evaluation.  Return for any emergent symptoms.  Given your history of chronic kidney disease we were unable to do colchicine.  However the steroid should help with the gout attack.  I sent colchicine into the pharmacy prior to realizing that your kidney function will not allow this medication.  If the pharmacy has this medication ready for you please do not pick it up.

## 2024-08-11 NOTE — ED Triage Notes (Signed)
 Pt has c/o left elbow swelling, pain- feels like there is a knot. Pt has difficulty bending his arm

## 2024-08-11 NOTE — ED Provider Notes (Signed)
 [place] EMERGENCY DEPARTMENT AT [facility] Provider Note   CSN: [id number] Arrival date & time: 08/11/24  1057     Patient presents with: Joint Swelling   Jeremy Hood is a 48 y.o. male.   48 year old male presents today for concern of left elbow pain, left wrist pain.  Left elbow pain has been ongoing for about a week.  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  2 to 3 days ago he started having left wrist pain.  Denies any injury.  Does have a history of gout.  Typically he has attacks in his foot.  Denies any fever.  No redness to his elbow.  Has some range of motion to his elbow. He is right hand dominant.  The history is provided by the patient. No language interpreter was used.       Prior to Admission medications   Medication Sig Start Date End Date Taking? Authorizing Provider  amoxicillin -clavulanate (AUGMENTIN ) 875-125 MG tablet Take 1 tablet by mouth every 12 (twelve) hours. 08/11/24  Yes Wassim Kirksey, PA-C  predniSONE  (STERAPRED UNI-PAK 21 TAB) 10 MG (21) TBPK tablet Take by mouth daily. Take 6 tabs by mouth daily  for 2 days, then 5 tabs for 2 days, then 4 tabs for 2 days, then 3 tabs for 2 days, 2 tabs for 2 days, then 1 tab by mouth daily for 2 days 08/11/24  Yes Hildegard, Lynden Flemmer, PA-C  carvedilol  (COREG ) 12.5 MG tablet Take 1 tablet (12.5 mg total) by mouth 2 (two) times daily with a meal. 07/22/24   Milford, Harlene HERO, FNP  dapagliflozin propanediol (FARXIGA) 10 MG TABS tablet Take 1 tablet (10 mg total) by mouth daily. 07/22/24   Glena Harlene HERO, FNP  ferrous sulfate  325 (65 FE) MG tablet Take 1 tablet (325 mg total) by mouth daily with breakfast. Patient not taking: Reported on 07/22/2024 05/07/24   Gomez-Caraballo, Maria, MD  gabapentin (NEURONTIN) 300 MG capsule Take 1 capsule (300 mg total) by mouth daily as needed (pain, neuropathy). 07/20/24 08/19/24  Emil Share, DO  hydrALAZINE  (APRESOLINE ) 25 MG tablet Take 1 tablet (25 mg total) by  mouth every 8 (eight) hours. Patient not taking: Reported on 07/22/2024 05/06/24 06/05/24  Elnora Ip, MD  spironolactone  (ALDACTONE ) 25 MG tablet Take 1 tablet (25 mg total) by mouth daily. 07/22/24   Milford, Harlene HERO, FNP  torsemide  (DEMADEX ) 20 MG tablet Take 2 tablets (40 mg total) by mouth daily. 07/22/24   Milford, Harlene HERO, FNP  valsartan  (DIOVAN ) 160 MG tablet Take 1 tablet (160 mg total) by mouth 2 (two) times daily. 07/22/24   Milford, Harlene HERO, FNP    Allergies: Zestril  [lisinopril ]    Review of Systems  Constitutional:  Negative for chills and fever.  All other systems reviewed and are negative.   Updated Vital Signs BP (!) 160/119 (BP Location: Left Arm)   Pulse 73   Temp 98.7 F (37.1 C) (Oral)   Resp 18   SpO2 99%   Physical Exam Vitals and nursing note reviewed.  Constitutional:      General: He is not in acute distress.    Appearance: Normal appearance. He is not ill-appearing.  HENT:     Head: Normocephalic and atraumatic.     Nose: Nose normal.  Eyes:     Conjunctiva/sclera: Conjunctivae normal.  Pulmonary:     Effort: Pulmonary effort is normal. No respiratory distress.  Musculoskeletal:  General: No deformity.     Comments: Left elbow with swelling over the olecranon process.  Mildly tender.  No overlying erythema.  Neurovascularly intact in the left upper extremity.  Left wrist exquisitely tender.  No overlying erythema.  There is some active range of motion.  Weakness secondary to pain.  Left shoulder without tenderness to palpation.  Compartments in the left forearm are soft.  Skin:    Findings: No rash.  Neurological:     Mental Status: He is alert.     (all labs ordered are listed, but only abnormal results are displayed) Labs Reviewed - No data to display  EKG: None  Radiology: No results found.   Procedures   Medications Ordered in the ED  ondansetron  (ZOFRAN -ODT) disintegrating tablet 4 mg (4 mg Oral Given 08/11/24  1256)  HYDROcodone -acetaminophen  (NORCO/VICODIN) 5-325 MG per tablet 1 tablet (1 tablet Oral Given 08/11/24 1256)                                    Medical Decision Making Amount and/or Complexity of Data Reviewed Radiology: ordered.  Risk Prescription drug management.   Medical Decision Making / ED Course   This patient presents to the ED for concern of left elbow pain, left wrist pain, this involves an extensive number of treatment options, and is a complaint that carries with it a high risk of complications and morbidity.  The differential diagnosis includes septic joint, bursitis, gout, fracture, contusion  MDM: 47 year old male presents today for concern of left elbow pain and left wrist pain.  He has history of gout but has never had it in either of these joints. 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  His exam for the left elbow appears consistent with bursitis.  Given the duration of symptoms will treat with Augmentin  to cover for infected bursitis. Exquisitely tender over the left wrist.  We discussed arthrocentesis to rule out septic joint however he is afebrile and does not want this done.  We discussed that this is a surgical emergency.  He would like to proceed to treat this as a gout attack and if anything gets worse he would like to come back and consider this at that time. Feel this is reasonable at this time.  Patient discharged with prednisone  and Augmentin . Discharged in stable condition.  Return precaution discussed.  Patient voices understanding and is in agreement with plan.  Left wrist x-ray without acute bony abnormality.  Does show soft tissue swelling.  Left elbow x-ray reviewed from a few days ago shows no acute bony abnormality.  Lab Tests: -I ordered, reviewed, and interpreted labs.   The pertinent results include:   Labs Reviewed - No data to display    EKG  EKG Interpretation Date/Time:    Ventricular Rate:    PR Interval:     QRS Duration:    QT Interval:    QTC Calculation:   R Axis:      Text Interpretation:           Imaging Studies ordered: I ordered imaging studies including left wrist x ray I independently visualized and interpreted imaging. I agree with the radiologist interpretation   Medicines ordered and prescription drug management: Meds ordered this encounter  Medications   ondansetron  (ZOFRAN -ODT) disintegrating tablet 4 mg   HYDROcodone -acetaminophen  (NORCO/VICODIN) 5-325 MG per tablet 1 tablet  Refill:  0   DISCONTD: colchicine tablet 1.2 mg   predniSONE  (STERAPRED UNI-PAK 21 TAB) 10 MG (21) TBPK tablet    Sig: Take by mouth daily. Take 6 tabs by mouth daily  for 2 days, then 5 tabs for 2 days, then 4 tabs for 2 days, then 3 tabs for 2 days, 2 tabs for 2 days, then 1 tab by mouth daily for 2 days    Dispense:  42 tablet    Refill:  0    Supervising Provider:   CLEOTILDE ROGUE [[id number]]   DISCONTD: colchicine 0.6 MG tablet    Sig: Take 1 tablet (0.6 mg total) by mouth once for 1 dose. Take this 1 hour after the dose you were given in the emergency department.  This should be around 2:15 PM today 10/23.    Dispense:  1 tablet    Refill:  0    Supervising Provider:   MILLER, BRIAN [[id number]]   amoxicillin -clavulanate (AUGMENTIN ) 875-125 MG tablet    Sig: Take 1 tablet by mouth every 12 (twelve) hours.    Dispense:  14 tablet    Refill:  0    Supervising Provider:   CLEOTILDE ROGUE [[id number]]    -I have reviewed the patients home medicines and have made adjustments as needed   Reevaluation: After the interventions noted above, I reevaluated the patient and found that they have :stayed the same  Co morbidities that complicate the patient evaluation  Past Medical History:  Diagnosis Date   Chest pain 11/2016   Hypertension       Dispostion: Discharged in stable condition.  Return precaution discussed.  Patient voices understanding and is in agreement with plan.  Final diagnoses:   Olecranon bursitis of left elbow  Left wrist pain    ED Discharge Orders          Ordered    predniSONE  (STERAPRED UNI-PAK 21 TAB) 10 MG (21) TBPK tablet  Daily        08/11/24 1310    colchicine 0.6 MG tablet   Once,   Status:  Discontinued        08/11/24 1310    amoxicillin -clavulanate (AUGMENTIN ) 875-125 MG tablet  Every 12 hours        08/11/24 1310               Hildegard Loge, PA-C 08/11/24 1436    Franklyn Sid SAILOR, MD 08/11/24 1606

## 2024-08-12 ENCOUNTER — Telehealth (HOSPITAL_COMMUNITY): Payer: Self-pay

## 2024-08-12 NOTE — Telephone Encounter (Signed)
 Called to confirm/remind patient of their appointment at the [facility] on 08/15/24.   Appointment:   [] Confirmed  [x] Left mess   [] No answer/No voice mail  [] VM Full/unable to leave message  [] Phone not in service  And to bring in all medications and/or complete list.

## 2024-08-12 NOTE — Progress Notes (Signed)
 [facility]ED [facility] CLINIC NOTE  Primary Care: Patient, No Pcp Per HF Cardiologist: Dr. Zenaida  HPI: 48 y.o. AAM w/ h/o untreated & uncontrolled HTN, diastolic heart failure and polysubstance use. H/o angioedema w/ ACEi. Newly diagnosed systolic heart failure 04/2024.   He was admitted in 2018 for chest pain and acute diastolic heart failure. He was also found to be cocaine + on admit. Echo then showed normal LVEF 55-60%, severe LVH w/ GIDD, RV nl. NST interpreted a mild area of reversibility anteriorly concerning for ischemia, however on further investigation, anterior attenuation was felt to be 2/2 chest wall artifact. Chest pain and trop bump felt likely 2/2 demand ischemia from CHF and HTN. Did not get LHC. Treated medically but failed to f/u w/ cardiology.    Admitted 7/25 with new systolic HF and HTN. Not taking meds for HTN. Uses cocaine, ETOH and THC. Echo showed EF 30-35%, mod LVH, GIIDD, mod LAE, mod MR, RV mod reduced. Diuresed with IV lasix . cMRI showed LVEF 26%, RVEF 51%, LGE pattern could be seen in HCM but could also represent genetic (arrhythmogenic) or infiltrative cardiomyopathy. Cannot exclude cardiac sarcoid. Unable to get PET due to being uninsured. GDMT titrated. He was discharged home, weight 294 lbs.   Seen in ED 07/20/24 for SOB and CP. Not on meds. Given IV lasix .   Today he returns for HF follow up, wife present via phone. Overall feeling poorly. He has SOB walking short distances on flat ground. He has light headedness. Urinating briskly on current torsemide  dose. Drinking > 2 L/day. Denies palpitations, abnormal bleeding, CP, edema, or PND/Orthopnea. Appetite ok. Has checked weight at home. Taking all medications. Smokes a pack of cigs weekly,  No ETOH or drugs. Smokes THC.  Started on prednisone  for bursitis for past couple of days. Works at [employer]. He snores.   Past Medical History:  Diagnosis Date   Chest pain 11/2016   Hypertension    Current Outpatient  Medications  Medication Sig Dispense Refill   amoxicillin -clavulanate (AUGMENTIN ) 875-125 MG tablet Take 1 tablet by mouth every 12 (twelve) hours. 14 tablet 0   carvedilol  (COREG ) 12.5 MG tablet Take 1 tablet (12.5 mg total) by mouth 2 (two) times daily with a meal. 60 tablet 11   dapagliflozin propanediol (FARXIGA) 10 MG TABS tablet Take 1 tablet (10 mg total) by mouth daily. 30 tablet 11   ferrous sulfate  325 (65 FE) MG tablet Take 1 tablet (325 mg total) by mouth daily with breakfast. 30 tablet 3   gabapentin (NEURONTIN) 300 MG capsule Take 1 capsule (300 mg total) by mouth daily as needed (pain, neuropathy). 30 capsule 0   hydrALAZINE  (APRESOLINE ) 25 MG tablet Take 1 tablet (25 mg total) by mouth every 8 (eight) hours. 90 tablet 0   predniSONE  (STERAPRED UNI-PAK 21 TAB) 10 MG (21) TBPK tablet Take by mouth daily. Take 6 tabs by mouth daily  for 2 days, then 5 tabs for 2 days, then 4 tabs for 2 days, then 3 tabs for 2 days, 2 tabs for 2 days, then 1 tab by mouth daily for 2 days 42 tablet 0   spironolactone  (ALDACTONE ) 25 MG tablet Take 1 tablet (25 mg total) by mouth daily. 30 tablet 11   torsemide  (DEMADEX ) 20 MG tablet Take 2 tablets (40 mg total) by mouth daily. 60 tablet 11   valsartan  (DIOVAN ) 160 MG tablet Take 1 tablet (160 mg total) by mouth 2 (two) times daily.  No current facility-administered medications for this encounter.    Allergies  Allergen Reactions   Zestril  [Lisinopril ] Swelling    Angioedema with ACEI  Tolerated Losartan  without issue    Social History   Socioeconomic History   Marital status: Married    Spouse name: Not on file   Number of children: 2   Years of education: Not on file   Highest education level: High school graduate  Occupational History   Occupation: [employer]  Tobacco Use   Smoking status: Every Day    Current packs/day: 0.00    Average packs/day: 0.5 packs/day for 20.0 years (10.0 ttl pk-yrs)    Types: Cigarettes    Start  date: 11/23/1996    Last attempt to quit: 11/23/2016    Years since quitting: 7.7   Smokeless tobacco: Never  Vaping Use   Vaping status: Never Used  Substance and Sexual Activity   Alcohol  use: Not Currently   Drug use: Yes    Types: Marijuana    Comment: everyday   Sexual activity: Not Currently  Other Topics Concern   Not on file  Social History Narrative   Not on file   Social Drivers of Health   Financial Resource Strain: High Risk (07/22/2024)   Overall Financial Resource Strain (CARDIA)    Difficulty of Paying Living Expenses: Hard  Food Insecurity: No Food Insecurity (04/27/2024)   Hunger Vital Sign    Worried About Running Out of Food in the Last Year: Never true    Ran Out of Food in the Last Year: Never true  Transportation Needs: No Transportation Needs (04/27/2024)   PRAPARE - Administrator, Civil Service (Medical): No    Lack of Transportation (Non-Medical): No  Physical Activity: Not on file  Stress: Not on file  Social Connections: Moderately Isolated (04/27/2024)   Social Connection and Isolation Panel    Frequency of Communication with Friends and Family: More than three times a week    Frequency of Social Gatherings with Friends and Family: Once a week    Attends Religious Services: Never    Database Administrator or Organizations: No    Attends Banker Meetings: Never    Marital Status: Married  Catering Manager Violence: Not At Risk (04/27/2024)   Humiliation, Afraid, Rape, and Kick questionnaire    Fear of Current or Ex-Partner: No    Emotionally Abused: No    Physically Abused: No    Sexually Abused: No   Family History  Problem Relation Age of Onset   Hypertension Mother    Diabetes Mother    Wt Readings from Last 3 Encounters:  08/15/24 (!) 143.2 kg (315 lb 9.6 oz)  08/08/24 136.1 kg (300 lb)  07/22/24 (!) 137.5 kg (303 lb 3.2 oz)   BP (!) 158/100   Pulse 78   Wt (!) 143.2 kg (315 lb 9.6 oz)   SpO2 98%   BMI 44.02 kg/m    PHYSICAL EXAM: General:  NAD. No resp difficulty, walked into clinic HEENT: Normal Neck: Supple. Thick neck but JVP to jaw Cor: Regular rate & rhythm. No rubs, gallops or murmurs. Lungs: Clear, diminished in bases Abdomen: Obese nontender, + distended.  Extremities: No cyanosis, clubbing, rash, edema Neuro: Alert & oriented x 3, moves all 4 extremities w/o difficulty. Affect pleasant.  ReDs reading: 56 %, abnormal  ASSESSMENT & PLAN: 1. Acute on Chronic Systolic Heart Failure - Suspect most likely HTN CM but will need to  r/o infiltrative process.  - Echo 2018 EF 60-65%, severe LVH w/ GIDD, RV ok - Echo this admit, EF now 30-35%, mod LVH, RV mod reduced  - cMRI 05/03/24: LVEF 26%, RVEF 51%, LGE pattern could be seen in HCM but could also represent genetic (arrhythmogenic) or infiltrative cardiomyopathy. Cannot exclude cardiac sarcoid. - Unable to obtain cardiac PET for sarcoid workup until he has insurance. Genetic testing has been sent. - NYHA III, he is volume overloaded. Weight up 15 lbs, ReDs 56% - Increase torsemide  to 40 mg bid, start 40 KCL daily. - Take metolazone 2.5 mg + 40 KCL x 1 dose today. - Continue valsartan  160 mg bid (no Entresto with angioedema with ACEi). - Continue spironolactone  25 mg daily  - Continue Coreg  12.5 mg bid  - Continue Farxiga 10 mg daily. - Add back hydralazine  next visit. - imperative to quit cocaine  - Labs today. Repeat BMET at close follow up   2. Hypertension  - BP improving but still elevated - HF GDMT titration per above  - No evidence of renal artery stenosis on dopplers - Arrange sleep study once he gets insurance   3. Polysubstance use - Disucssed cessation.   4. IDA  - tsat 11 - ? If 2/2 hemorrhoidal bleeding  - given IV Fe - will need PCP and outpatient w/u   5. SDOH - Needs PCP - Meds thru HF fund - Engage HFSW for insurance resources  Follow up in 10-14 days with APP. He is high-risk for re-admission.  Harlene Gainer, FNP-BC 08/15/24

## 2024-08-15 ENCOUNTER — Encounter (HOSPITAL_COMMUNITY): Payer: Self-pay

## 2024-08-15 ENCOUNTER — Ambulatory Visit (HOSPITAL_COMMUNITY): Payer: Self-pay | Admitting: Family Medicine

## 2024-08-15 ENCOUNTER — Ambulatory Visit (HOSPITAL_COMMUNITY)
Admission: RE | Admit: 2024-08-15 | Discharge: 2024-08-15 | Disposition: A | Discharge status: Home / Self Care | Payer: Self-pay | Source: Ambulatory Visit | Attending: Family Medicine | Admitting: Family Medicine

## 2024-08-15 ENCOUNTER — Other Ambulatory Visit (HOSPITAL_COMMUNITY): Payer: Self-pay

## 2024-08-15 VITALS — BP 158/100 | HR 78 | Wt 315.6 lb

## 2024-08-15 DIAGNOSIS — Z79899 Other long term (current) drug therapy: Secondary | ICD-10-CM | POA: Insufficient documentation

## 2024-08-15 DIAGNOSIS — I5022 Chronic systolic (congestive) heart failure: Secondary | ICD-10-CM

## 2024-08-15 DIAGNOSIS — I110 Hypertensive heart disease with heart failure: Secondary | ICD-10-CM | POA: Insufficient documentation

## 2024-08-15 DIAGNOSIS — F1410 Cocaine abuse, uncomplicated: Secondary | ICD-10-CM | POA: Insufficient documentation

## 2024-08-15 DIAGNOSIS — Z7984 Long term (current) use of oral hypoglycemic drugs: Secondary | ICD-10-CM | POA: Insufficient documentation

## 2024-08-15 DIAGNOSIS — F1910 Other psychoactive substance abuse, uncomplicated: Secondary | ICD-10-CM

## 2024-08-15 DIAGNOSIS — E8770 Fluid overload, unspecified: Secondary | ICD-10-CM | POA: Insufficient documentation

## 2024-08-15 DIAGNOSIS — I5023 Acute on chronic systolic (congestive) heart failure: Secondary | ICD-10-CM | POA: Insufficient documentation

## 2024-08-15 DIAGNOSIS — Z139 Encounter for screening, unspecified: Secondary | ICD-10-CM

## 2024-08-15 DIAGNOSIS — D509 Iron deficiency anemia, unspecified: Secondary | ICD-10-CM

## 2024-08-15 DIAGNOSIS — I10 Essential (primary) hypertension: Secondary | ICD-10-CM

## 2024-08-15 LAB — BASIC METABOLIC PANEL WITH GFR
Anion gap: 12 (ref 5–15)
BUN: 38 mg/dL — ABNORMAL HIGH (ref 6–20)
CO2: 24 mmol/L (ref 22–32)
Calcium: 9 mg/dL (ref 8.9–10.3)
Chloride: 103 mmol/L (ref 98–111)
Creatinine, Ser: 2.09 mg/dL — ABNORMAL HIGH (ref 0.61–1.24)
GFR, Estimated: 39 mL/min — ABNORMAL LOW (ref >60)
Glucose, Bld: 165 mg/dL — ABNORMAL HIGH (ref 70–99)
Potassium: 4.2 mmol/L (ref 3.5–5.1)
Sodium: 139 mmol/L (ref 135–145)

## 2024-08-15 LAB — BRAIN NATRIURETIC PEPTIDE: B Natriuretic Peptide: 207.2 pg/mL — ABNORMAL HIGH (ref 0.0–100.0)

## 2024-08-15 MED ORDER — TORSEMIDE 20 MG PO TABS
40.0000 mg | ORAL_TABLET | Freq: Two times a day (BID) | ORAL | 11 refills | Status: DC
  Filled 2024-08-15: qty 120, 30d supply, fill #0

## 2024-08-15 MED ORDER — METOLAZONE 2.5 MG PO TABS
2.5000 mg | ORAL_TABLET | Freq: Once | ORAL | 0 refills | Status: DC
  Filled 2024-08-15: qty 1, 1d supply, fill #0

## 2024-08-15 MED ORDER — POTASSIUM CHLORIDE CRYS ER 20 MEQ PO TBCR
40.0000 meq | EXTENDED_RELEASE_TABLET | Freq: Every day | ORAL | 3 refills | Status: AC
  Filled 2024-08-15: qty 60, 30d supply, fill #0
  Filled 2024-11-04 (×2): qty 60, 30d supply, fill #1

## 2024-08-15 NOTE — Progress Notes (Signed)
 ReDS Vest / Clip - 08/15/24 1035       ReDS Vest / Clip   Station Marker D    Ruler Value 40    ReDS Value Range High volume overload    ReDS Actual Value 56    Anatomical Comments sitting

## 2024-08-15 NOTE — Patient Instructions (Addendum)
 Thank you for coming in today  If you had labs drawn today, any labs that are abnormal the clinic will call you No news is good news  Medications: Increase Torsemide  to 40 mg 2 times daily START Potassium 40 meq daily  Take 1 Metolazone 2.5 mg with a extra 40 meq of Potassium TODAY 08/15/2024   Follow up appointments:  Your physician recommends that you schedule a follow-up appointment in:  10-14 days in clinic   Do the following things EVERYDAY: Weigh yourself in the morning before breakfast. Write it down and keep it in a log. Take your medicines as prescribed Eat low salt foods--Limit salt (sodium) to 2000 mg per day.  Stay as active as you can everyday Limit all fluids for the day to less than 2 liters   At the [facility], you and your health needs are our priority. As part of our continuing mission to provide you with exceptional heart care, we have created designated Provider Care Teams. These Care Teams include your primary Cardiologist (physician) and Advanced Practice Providers (APPs- Physician Assistants and Nurse Practitioners) who all work together to provide you with the care you need, when you need it.   You may see any of the following providers on your designated Care Team at your next follow up: Dr Toribio Fuel Dr Ezra Shuck Dr. Ria Gardenia Greig Lenetta, NP Caffie Shed, [place] [facility] [place], [place] Beckey Coe, NP Tinnie Redman, PharmD   Please be sure to bring in all your medications bottles to every appointment.    Thank you for choosing [facility]  If you have any questions or concerns before your next appointment please send us  a message through [place] or call our office at [phone].    TO LEAVE A MESSAGE FOR THE NURSE SELECT OPTION 2, PLEASE LEAVE A MESSAGE INCLUDING: YOUR NAME DATE OF BIRTH CALL BACK NUMBER REASON FOR CALL**this is important as we prioritize  the call backs  YOU WILL RECEIVE A CALL BACK THE SAME DAY AS LONG AS YOU CALL BEFORE 4:00 PM

## 2024-08-15 NOTE — Progress Notes (Signed)
 H&V Care Navigation CSW Progress Note  Clinical Social Worker met with pt to discuss concerns with lack of insurance at this time.  CSW had met with pt at previous appt and provided [employer] and [employer] application- states his grandchildren destroyed those and requests new ones.  CSW provided with new applications as well as number to [facility] to get appt.  CSW will plan to follow up with patient at next appt.  Andriette HILARIO Leech, LCSW Clinical Social Worker [facility] Desk#: [phone] Cell#: [phone]

## 2024-08-24 NOTE — Progress Notes (Signed)
 ADVANCED HF CLINIC NOTE  Primary Care: Jeremy Hood, No Pcp Per HF Cardiologist: Dr. Zenaida  HPI: 48 y.o. AAM w/ h/o untreated & uncontrolled HTN, diastolic heart failure and polysubstance use. H/o angioedema w/ ACEi. Newly diagnosed systolic heart failure 04/2024.   He was admitted in 2018 for chest pain and acute diastolic heart failure. He was also found to be cocaine + on admit. Echo then showed normal LVEF 55-60%, severe LVH w/ GIDD, RV nl. NST interpreted a mild area of reversibility anteriorly concerning for ischemia, however on further investigation, anterior attenuation was felt to be 2/2 chest wall artifact. Chest pain and trop bump felt likely 2/2 demand ischemia from CHF and HTN. Did not get LHC. Treated medically but failed to f/u w/ cardiology.    Admitted 7/25 with new systolic HF and HTN. Not taking meds for HTN. Uses cocaine, ETOH and THC. Echo showed EF 30-35%, mod LVH, GIIDD, mod LAE, mod MR, RV mod reduced. Diuresed with IV lasix . cMRI showed LVEF 26%, RVEF 51%, LGE pattern could be seen in HCM but could also represent genetic (arrhythmogenic) or infiltrative cardiomyopathy. Cannot exclude cardiac sarcoid. Unable to get PET due to being uninsured. GDMT titrated. He was discharged home, weight 294 lbs.   Seen in ED 07/20/24 for SOB and CP. Not on meds. Given IV lasix .   Today he returns for HF follow up with wife, via phone. Overall feeling tired. He feels occasional lightheadedness. He is not SOB walking on flat ground. Abdomen feels tight. Denies palpitations, abnormal bleeding, CP, edema, or PND/Orthopnea. Appetite ok. Weight at home 302 pounds. Taking all medications. Smokes 3 cigs/day,  no ETOH or drugs. Smokes THC.  Works at [employer]. He snores.   Past Medical History:  Diagnosis Date   Chest pain 11/2016   Hypertension    Current Outpatient Medications  Medication Sig Dispense Refill   carvedilol  (COREG ) 12.5 MG tablet Take 1 tablet (12.5 mg total) by mouth 2  (two) times daily with a meal. 60 tablet 11   dapagliflozin propanediol (FARXIGA) 10 MG TABS tablet Take 1 tablet (10 mg total) by mouth daily. 30 tablet 11   ferrous sulfate  325 (65 FE) MG tablet Take 1 tablet (325 mg total) by mouth daily with breakfast. 30 tablet 3   gabapentin (NEURONTIN) 300 MG capsule Take 1 capsule (300 mg total) by mouth daily as needed (pain, neuropathy). 30 capsule 0   hydrALAZINE  (APRESOLINE ) 25 MG tablet Take 1 tablet (25 mg total) by mouth every 8 (eight) hours. 90 tablet 0   potassium chloride  SA (KLOR-CON  M) 20 MEQ tablet Take 2 tablets (40 mEq total) by mouth daily. 120 tablet 3   spironolactone  (ALDACTONE ) 25 MG tablet Take 1 tablet (25 mg total) by mouth daily. 30 tablet 11   torsemide  (DEMADEX ) 20 MG tablet Take 2 tablets (40 mg total) by mouth 2 (two) times daily. 120 tablet 11   valsartan  (DIOVAN ) 160 MG tablet Take 1 tablet (160 mg total) by mouth 2 (two) times daily.     amoxicillin -clavulanate (AUGMENTIN ) 875-125 MG tablet Take 1 tablet by mouth every 12 (twelve) hours. 14 tablet 0   metolazone (ZAROXOLYN) 2.5 MG tablet Take 1 tablet 2.5 mg today with additional 2 tablets (40 meq) of Potassium (Jeremy Hood not taking: Reported on 08/29/2024) 1 tablet 0   predniSONE  (STERAPRED UNI-PAK 21 TAB) 10 MG (21) TBPK tablet Take by mouth daily. Take 6 tabs by mouth daily  for 2 days, then 5 tabs  for 2 days, then 4 tabs for 2 days, then 3 tabs for 2 days, 2 tabs for 2 days, then 1 tab by mouth daily for 2 days 42 tablet 0   No current facility-administered medications for this encounter.    Allergies  Allergen Reactions   Zestril  [Lisinopril ] Swelling    Angioedema with ACEI  Tolerated Losartan  without issue    Social History   Socioeconomic History   Marital status: Married    Spouse name: Not on file   Number of children: 2   Years of education: Not on file   Highest education level: High school graduate  Occupational History   Occupation: [employer]   Tobacco Use   Smoking status: Every Day    Current packs/day: 0.00    Average packs/day: 0.5 packs/day for 20.0 years (10.0 ttl pk-yrs)    Types: Cigarettes    Start date: 11/23/1996    Last attempt to quit: 11/23/2016    Years since quitting: 7.7   Smokeless tobacco: Never  Vaping Use   Vaping status: Never Used  Substance and Sexual Activity   Alcohol  use: Not Currently   Drug use: Yes    Types: Marijuana    Comment: everyday   Sexual activity: Not Currently  Other Topics Concern   Not on file  Social History Narrative   Not on file   Social Drivers of Health   Financial Resource Strain: High Risk (07/22/2024)   Overall Financial Resource Strain (CARDIA)    Difficulty of Paying Living Expenses: Hard  Food Insecurity: No Food Insecurity (04/27/2024)   Hunger Vital Sign    Worried About Running Out of Food in the Last Year: Never true    Ran Out of Food in the Last Year: Never true  Transportation Needs: No Transportation Needs (04/27/2024)   PRAPARE - Administrator, Civil Service (Medical): No    Lack of Transportation (Non-Medical): No  Physical Activity: Not on file  Stress: Not on file  Social Connections: Moderately Isolated (04/27/2024)   Social Connection and Isolation Panel    Frequency of Communication with Friends and Family: More than three times a week    Frequency of Social Gatherings with Friends and Family: Once a week    Attends Religious Services: Never    Database Administrator or Organizations: No    Attends Banker Meetings: Never    Marital Status: Married  Catering Manager Violence: Not At Risk (04/27/2024)   Humiliation, Afraid, Rape, and Kick questionnaire    Fear of Current or Ex-Partner: No    Emotionally Abused: No    Physically Abused: No    Sexually Abused: No   Family History  Problem Relation Age of Onset   Hypertension Mother    Diabetes Mother    Wt Readings from Last 3 Encounters:  08/29/24 (!) 142.5 kg (314 lb  3.2 oz)  08/15/24 (!) 143.2 kg (315 lb 9.6 oz)  08/08/24 136.1 kg (300 lb)   BP (!) 144/82   Pulse 81   Wt (!) 142.5 kg (314 lb 3.2 oz)   SpO2 98%   BMI 43.82 kg/m   PHYSICAL EXAM: General:  NAD. No resp difficulty, walked into clinic HEENT: Normal Neck: Supple. Thick neck Cor: Regular rate & rhythm. No rubs, gallops or murmurs. Lungs: Clear Abdomen: Obese, nontender, nondistended.  Extremities: No cyanosis, clubbing, rash, edema Neuro: Alert & oriented x 3, moves all 4 extremities w/o difficulty. Affect pleasant.  ReDs reading: 42%, abnormal  ASSESSMENT & PLAN: Chronic Systolic Heart Failure - Suspect most likely HTN CM but will need to r/o infiltrative process.  - Echo 2018 EF 60-65%, severe LVH w/ GIDD, RV ok - Echo 7/25: EF now 30-35%, mod LVH, RV mod reduced  - cMRI 05/03/24: LVEF 26%, RVEF 51%, LGE pattern could be seen in HCM but could also represent genetic (arrhythmogenic) or infiltrative cardiomyopathy. Cannot exclude cardiac sarcoid. - Unable to obtain cardiac PET for sarcoid workup until he has insurance. Genetic testing has been sent. - NYHA II, fatigue main symptom. Volume improving, ReDs previously 56, now 42%. GDMT limited by CKD - Restart hydralazine  25 mg tid and Imdur  15 mg daily - Avoiding digoxin with Scr>2. - Continue torsemide  40 mg bid,+ 40 KCL daily. - Continue metolazone 2.5 mg + 40 KCL PRN - Continue valsartan  160 mg bid (no Entresto with angioedema with ACEi). - Continue spironolactone  25 mg daily  - Continue Coreg  12.5 mg bid  - Continue Farxiga 10 mg daily. - Labs today - Repeat echo next visit   Hypertension  - BP improving but still elevated - Starting hydral/imdur  as above - No evidence of renal artery stenosis on dopplers - Arrange sleep study once he gets insurance   Polysubstance use - No further cocaine use - Smokes THC daily - Discussed tobacco/THC cessation.   IDA  - tsat 11, received iron  infusion - Recheck iron  studies  today - will need PCP work up  Fatigue - likely multifactorial - Needs sleep study - Check iron  studies and TSH today  SDOH - Needs PCP, given name/# of office to call today to establish care - Meds thru HF fund - Engage HFSW for insurance resources  Follow up in 2 months with Dr. Zenaida + echo  [place], FNP-BC 08/29/24

## 2024-08-26 ENCOUNTER — Telehealth (HOSPITAL_COMMUNITY): Payer: Self-pay

## 2024-08-26 NOTE — Telephone Encounter (Signed)
 Called to confirm/remind patient of their appointment at the [facility] on 08/29/24.   Appointment:   [x] Confirmed  [] Left mess   [] No answer/No voice mail  [] VM Full/unable to leave message  [] Phone not in service  Patient reminded to bring all medications and/or complete list.  Confirmed patient has transportation. Gave directions, instructed to utilize valet parking.

## 2024-08-29 ENCOUNTER — Other Ambulatory Visit (HOSPITAL_COMMUNITY): Payer: Self-pay

## 2024-08-29 ENCOUNTER — Encounter (HOSPITAL_COMMUNITY): Payer: Self-pay

## 2024-08-29 ENCOUNTER — Ambulatory Visit (HOSPITAL_COMMUNITY)
Admission: RE | Admit: 2024-08-29 | Discharge: 2024-08-29 | Disposition: A | Discharge status: Home / Self Care | Payer: Self-pay | Source: Ambulatory Visit | Attending: Family Medicine | Admitting: Family Medicine

## 2024-08-29 VITALS — BP 144/82 | HR 81 | Wt 314.2 lb

## 2024-08-29 DIAGNOSIS — D509 Iron deficiency anemia, unspecified: Secondary | ICD-10-CM | POA: Insufficient documentation

## 2024-08-29 DIAGNOSIS — Z7984 Long term (current) use of oral hypoglycemic drugs: Secondary | ICD-10-CM | POA: Insufficient documentation

## 2024-08-29 DIAGNOSIS — I110 Hypertensive heart disease with heart failure: Secondary | ICD-10-CM | POA: Insufficient documentation

## 2024-08-29 DIAGNOSIS — F1090 Alcohol use, unspecified, uncomplicated: Secondary | ICD-10-CM | POA: Insufficient documentation

## 2024-08-29 DIAGNOSIS — I10 Essential (primary) hypertension: Secondary | ICD-10-CM

## 2024-08-29 DIAGNOSIS — Z5971 Insufficient health insurance coverage: Secondary | ICD-10-CM | POA: Insufficient documentation

## 2024-08-29 DIAGNOSIS — Z139 Encounter for screening, unspecified: Secondary | ICD-10-CM

## 2024-08-29 DIAGNOSIS — F1910 Other psychoactive substance abuse, uncomplicated: Secondary | ICD-10-CM

## 2024-08-29 DIAGNOSIS — R5383 Other fatigue: Secondary | ICD-10-CM | POA: Insufficient documentation

## 2024-08-29 DIAGNOSIS — Z79899 Other long term (current) drug therapy: Secondary | ICD-10-CM | POA: Insufficient documentation

## 2024-08-29 DIAGNOSIS — F17210 Nicotine dependence, cigarettes, uncomplicated: Secondary | ICD-10-CM | POA: Insufficient documentation

## 2024-08-29 DIAGNOSIS — F1290 Cannabis use, unspecified, uncomplicated: Secondary | ICD-10-CM | POA: Insufficient documentation

## 2024-08-29 DIAGNOSIS — T465X6A Underdosing of other antihypertensive drugs, initial encounter: Secondary | ICD-10-CM | POA: Insufficient documentation

## 2024-08-29 DIAGNOSIS — Z91128 Patient's intentional underdosing of medication regimen for other reason: Secondary | ICD-10-CM | POA: Insufficient documentation

## 2024-08-29 DIAGNOSIS — I5022 Chronic systolic (congestive) heart failure: Secondary | ICD-10-CM | POA: Insufficient documentation

## 2024-08-29 LAB — BASIC METABOLIC PANEL WITH GFR
Anion gap: 13 (ref 5–15)
BUN: 42 mg/dL — ABNORMAL HIGH (ref 6–20)
CO2: 25 mmol/L (ref 22–32)
Calcium: 9 mg/dL (ref 8.9–10.3)
Chloride: 103 mmol/L (ref 98–111)
Creatinine, Ser: 2.41 mg/dL — ABNORMAL HIGH (ref 0.61–1.24)
GFR, Estimated: 33 mL/min — ABNORMAL LOW (ref >60)
Glucose, Bld: 173 mg/dL — ABNORMAL HIGH (ref 70–99)
Potassium: 4.1 mmol/L (ref 3.5–5.1)
Sodium: 141 mmol/L (ref 135–145)

## 2024-08-29 LAB — IRON AND TIBC
Iron: 39 ug/dL — ABNORMAL LOW (ref 45–182)
Saturation Ratios: 10 % — ABNORMAL LOW (ref 17.9–39.5)
TIBC: 382 ug/dL (ref 250–450)
UIBC: 343 ug/dL

## 2024-08-29 LAB — CBC
HCT: 34.9 % — ABNORMAL LOW (ref 39.0–52.0)
Hemoglobin: 11.3 g/dL — ABNORMAL LOW (ref 13.0–17.0)
MCH: 29 pg (ref 26.0–34.0)
MCHC: 32.4 g/dL (ref 30.0–36.0)
MCV: 89.7 fL (ref 80.0–100.0)
Platelets: 246 K/uL (ref 150–400)
RBC: 3.89 MIL/uL — ABNORMAL LOW (ref 4.22–5.81)
RDW: 15.1 % (ref 11.5–15.5)
WBC: 7.9 K/uL (ref 4.0–10.5)
nRBC: 0 % (ref 0.0–0.2)

## 2024-08-29 LAB — TSH: TSH: 1.392 u[IU]/mL (ref 0.350–4.500)

## 2024-08-29 LAB — FERRITIN: Ferritin: 354 ng/mL — ABNORMAL HIGH (ref 24–336)

## 2024-08-29 LAB — BRAIN NATRIURETIC PEPTIDE: B Natriuretic Peptide: 80.4 pg/mL (ref 0.0–100.0)

## 2024-08-29 MED ORDER — HYDRALAZINE HCL 25 MG PO TABS
25.0000 mg | ORAL_TABLET | Freq: Three times a day (TID) | ORAL | 0 refills | Status: DC
  Filled 2024-08-29: qty 90, 30d supply, fill #0

## 2024-08-29 MED ORDER — METOLAZONE 2.5 MG PO TABS
2.5000 mg | ORAL_TABLET | ORAL | 0 refills | Status: DC
  Filled 2024-08-29: qty 5, 35d supply, fill #0

## 2024-08-29 MED ORDER — ISOSORBIDE MONONITRATE ER 30 MG PO TB24
15.0000 mg | ORAL_TABLET | Freq: Every day | ORAL | 3 refills | Status: AC
  Filled 2024-08-29: qty 15, 30d supply, fill #0
  Filled 2024-11-04 (×2): qty 15, 30d supply, fill #1

## 2024-08-29 NOTE — Patient Instructions (Addendum)
 Thank you for coming in today  If you had labs drawn today, any labs that are abnormal the clinic will call you No news is good news  Mliss Fuchs NP [phone] [address] [place], [place] [address] [address]   Medications: Start Hydralazine  25 mg 1 tablet 3 times a day Start Imdur  15 mg 1/2 tablet daily  Take Metolazone 2.5 mg 1 tablet once a week as needed For weight gain of 3 lbs in 24 hours or 5 lbs in a week    Follow up appointments:  Your physician recommends that you schedule a follow-up appointment in:  2 months with Dr. Zenaida with echocardiogram Please call our office to schedule the follow-up appointment in December 2025 for January 2026   Do the following things EVERYDAY: Weigh yourself in the morning before breakfast. Write it down and keep it in a log. Take your medicines as prescribed Eat low salt foods--Limit salt (sodium) to 2000 mg per day.  Stay as active as you can everyday Limit all fluids for the day to less than 2 liters   At the [facility], you and your health needs are our priority. As part of our continuing mission to provide you with exceptional heart care, we have created designated Provider Care Teams. These Care Teams include your primary Cardiologist (physician) and Advanced Practice Providers (APPs- Physician Assistants and Nurse Practitioners) who all work together to provide you with the care you need, when you need it.   You may see any of the following providers on your designated Care Team at your next follow up: Dr Toribio Fuel Dr Ezra Shuck Dr. Ria Gardenia Greig Lenetta, NP Caffie Shed, [place] [facility] [place], [place] Beckey Coe, NP Tinnie Redman, PharmD   Please be sure to bring in all your medications bottles to every appointment.    Thank you for choosing [facility]  If you have any questions or concerns before your next appointment please  send us  a message through [place] or call our office at [phone].    TO LEAVE A MESSAGE FOR THE NURSE SELECT OPTION 2, PLEASE LEAVE A MESSAGE INCLUDING: YOUR NAME DATE OF BIRTH CALL BACK NUMBER REASON FOR CALL**this is important as we prioritize the call backs  YOU WILL RECEIVE A CALL BACK THE SAME DAY AS LONG AS YOU CALL BEFORE 4:00 PM

## 2024-08-29 NOTE — Progress Notes (Signed)
 ReDS Vest / Clip - 08/29/24 0900       ReDS Vest / Clip   Station Marker D    Ruler Value 39    ReDS Value Range High volume overload    ReDS Actual Value 42

## 2024-08-30 ENCOUNTER — Ambulatory Visit (HOSPITAL_COMMUNITY): Payer: Self-pay | Admitting: Family Medicine

## 2024-08-30 DIAGNOSIS — N1832 Chronic kidney disease, stage 3b: Secondary | ICD-10-CM

## 2024-08-30 DIAGNOSIS — I5022 Chronic systolic (congestive) heart failure: Secondary | ICD-10-CM

## 2024-08-30 DIAGNOSIS — D508 Other iron deficiency anemias: Secondary | ICD-10-CM

## 2024-08-30 DIAGNOSIS — I5020 Unspecified systolic (congestive) heart failure: Secondary | ICD-10-CM

## 2024-08-31 ENCOUNTER — Telehealth (HOSPITAL_COMMUNITY): Payer: Self-pay | Admitting: Family Medicine

## 2024-08-31 DIAGNOSIS — D508 Other iron deficiency anemias: Secondary | ICD-10-CM | POA: Insufficient documentation

## 2024-08-31 NOTE — Telephone Encounter (Signed)
 Patient referred to infusion pharmacy team for ambulatory infusion of IV iron .  Insurance - None  Site of care - Site of care: [facility] Dx code - I50.20/N18/32/D50.8 IV Iron  Therapy - Monoferric 1 g IV x 1  Infusion appointments - Scheduling team will schedule patient as soon as possible.    Karanveer Ramakrishnan D. Nicholaus Steinke, PharmD

## 2024-09-02 ENCOUNTER — Telehealth: Payer: Self-pay

## 2024-09-02 NOTE — Telephone Encounter (Signed)
 Patient is receiving Advance Medication - Supplied Externally. Medication: Monoferric Manufacture: [employer] Approval Dates: Approved from 09/01/2024 until 09/01/2025 ID: [id number] Reason: Self Pay

## 2024-09-05 MED ORDER — TORSEMIDE 20 MG PO TABS: 20.0000 mg | ORAL_TABLET | Freq: Every day | ORAL | Status: AC

## 2024-09-05 NOTE — Addendum Note (Signed)
 Addended by: BUELL POWELL HERO on: 09/05/2024 11:49 AM   Modules accepted: Orders

## 2024-09-05 NOTE — Telephone Encounter (Addendum)
 [place], Harlene HERO, FNP to Me  (Selected Message    08/31/24  8:07 AM Thank you for clarification. Will still hold torsemide  x 3 days, then resume at 20 mg daily    Med list updated

## 2024-09-07 ENCOUNTER — Other Ambulatory Visit (HOSPITAL_COMMUNITY): Payer: Self-pay | Admitting: Cardiology

## 2024-09-14 ENCOUNTER — Other Ambulatory Visit (HOSPITAL_COMMUNITY): Payer: Self-pay

## 2024-09-14 ENCOUNTER — Other Ambulatory Visit: Payer: Self-pay

## 2024-09-14 ENCOUNTER — Encounter: Payer: Self-pay | Admitting: Pulmonary Disease

## 2024-09-16 ENCOUNTER — Encounter (HOSPITAL_COMMUNITY): Payer: Self-pay | Admitting: Emergency Medicine

## 2024-09-16 ENCOUNTER — Ambulatory Visit (HOSPITAL_COMMUNITY): Payer: Self-pay | Admitting: Physician Assistant

## 2024-09-16 ENCOUNTER — Ambulatory Visit (HOSPITAL_COMMUNITY)
Admission: EM | Admit: 2024-09-16 | Discharge: 2024-09-16 | Disposition: A | Discharge status: Home / Self Care | Payer: Self-pay | Attending: Physician Assistant | Admitting: Physician Assistant

## 2024-09-16 DIAGNOSIS — M79671 Pain in right foot: Secondary | ICD-10-CM | POA: Insufficient documentation

## 2024-09-16 DIAGNOSIS — M10371 Gout due to renal impairment, right ankle and foot: Secondary | ICD-10-CM | POA: Insufficient documentation

## 2024-09-16 LAB — CBC WITH DIFFERENTIAL/PLATELET
Abs Immature Granulocytes: 0.02 K/uL (ref 0.00–0.07)
Basophils Absolute: 0 K/uL (ref 0.0–0.1)
Basophils Relative: 0 %
Eosinophils Absolute: 0.1 K/uL (ref 0.0–0.5)
Eosinophils Relative: 1 %
HCT: 34 % — ABNORMAL LOW (ref 39.0–52.0)
Hemoglobin: 10.7 g/dL — ABNORMAL LOW (ref 13.0–17.0)
Immature Granulocytes: 0 %
Lymphocytes Relative: 18 %
Lymphs Abs: 1.4 K/uL (ref 0.7–4.0)
MCH: 28.9 pg (ref 26.0–34.0)
MCHC: 31.5 g/dL (ref 30.0–36.0)
MCV: 91.9 fL (ref 80.0–100.0)
Monocytes Absolute: 0.7 K/uL (ref 0.1–1.0)
Monocytes Relative: 9 %
Neutro Abs: 5.3 K/uL (ref 1.7–7.7)
Neutrophils Relative %: 72 %
Platelets: 320 K/uL (ref 150–400)
RBC: 3.7 MIL/uL — ABNORMAL LOW (ref 4.22–5.81)
RDW: 14.2 % (ref 11.5–15.5)
WBC: 7.5 K/uL (ref 4.0–10.5)
nRBC: 0 % (ref 0.0–0.2)

## 2024-09-16 LAB — COMPREHENSIVE METABOLIC PANEL WITH GFR
ALT: 16 U/L (ref 0–44)
AST: 29 U/L (ref 15–41)
Albumin: 3.3 g/dL — ABNORMAL LOW (ref 3.5–5.0)
Alkaline Phosphatase: 99 U/L (ref 38–126)
Anion gap: 11 (ref 5–15)
BUN: 21 mg/dL — ABNORMAL HIGH (ref 6–20)
CO2: 26 mmol/L (ref 22–32)
Calcium: 8.9 mg/dL (ref 8.9–10.3)
Chloride: 104 mmol/L (ref 98–111)
Creatinine, Ser: 1.62 mg/dL — ABNORMAL HIGH (ref 0.61–1.24)
GFR, Estimated: 52 mL/min — ABNORMAL LOW (ref >60)
Glucose, Bld: 116 mg/dL — ABNORMAL HIGH (ref 70–99)
Potassium: 3.7 mmol/L (ref 3.5–5.1)
Sodium: 141 mmol/L (ref 135–145)
Total Bilirubin: 1 mg/dL (ref 0.0–1.2)
Total Protein: 7.1 g/dL (ref 6.5–8.1)

## 2024-09-16 LAB — URIC ACID: Uric Acid, Serum: 11.4 mg/dL — ABNORMAL HIGH (ref 3.7–8.6)

## 2024-09-16 MED ORDER — PREDNISONE 10 MG (21) PO TBPK: ORAL_TABLET | ORAL | 0 refills | Status: AC

## 2024-09-16 NOTE — ED Provider Notes (Signed)
 [facility]    CSN: [id number] Arrival date & time: 09/16/24  1118      History   Chief Complaint Chief Complaint  Patient presents with   Foot Pain    HPI Jeremy Hood is a 48 y.o. male.   Patient presents today with a 3-day history of right foot pain and swelling.  He has a history of gout and reports current symptoms are similar to previous episodes of this condition.  He denies any recent fall or known trauma.  He does report that he has increased the amount of red meat in his diet and believes that this triggered his symptoms.  He does not drink alcohol .  He has been taking Tylenol , gabapentin , muscle relaxer that belongs to his wife which have provided improvement but not resolution of symptoms.  He reports that pain is rated 7 on a 0-10 pain scale, described as throbbing, worse with palpation, no alleviating factors notified.  He was recently treated with prednisone  about a month ago for bursitis but has not tried additional medication.  He does have a history of chronic kidney disease which is likely contributing to gout flares.  He has not had a uric acid level obtained recently.  He does not take a thiazide diuretic and denies any recent medication changes.  He denies any numbness or paresthesias in his foot, fever, nausea, vomiting, swelling of his calf, calf pain, chest pain, shortness of breath, palpitations.  He is having difficulty with daily activities as he works on his feet and has not been able to attend work because of symptoms.    Past Medical History:  Diagnosis Date   Chest pain 11/2016   Hypertension     Patient Active Problem List   Diagnosis Date Noted   Iron  deficiency anemia secondary to inadequate dietary iron  intake 08/31/2024   HFrEF (heart failure with reduced ejection fraction) (HCC) 04/29/2024   Stage 3b chronic kidney disease (HCC) 04/28/2024   Acute systolic heart failure (HCC) 04/27/2024   Paroxysmal nocturnal dyspnea 04/27/2024    Anemia 12/17/2016   Obesity (BMI 35.0-39.9 without comorbidity) 12/10/2016   Acute respiratory failure with hypoxia (HCC)    Loosening of tooth    Cavitary lung disease    Dental abscess    Abnormal chest x-ray with multiple lung nodules    Liver lesion    Tobacco use    Cocaine use disorder (HCC)    Lung mass 11/26/2016   Substance abuse, coacaine and marijuana  11/26/2016   Cough    Septic embolism (HCC)    Chest pain 11/23/2016   Costochondritis 11/23/2016   Uncontrolled hypertension 11/23/2016    Past Surgical History:  Procedure Laterality Date   ADENOIDECTOMY     TEE WITHOUT CARDIOVERSION N/A 11/26/2016   Procedure: TRANSESOPHAGEAL ECHOCARDIOGRAM (TEE);  Surgeon: Aleene JINNY Passe, MD;  Location[facility] ENDOSCOPY;  Service: Cardiovascular;  Laterality: N/A;   VIDEO BRONCHOSCOPY Bilateral 12/02/2016   Procedure: VIDEO BRONCHOSCOPY WITHOUT FLUORO;  Surgeon: Toribio JINNY Domino, MD;  Location: [facility] ENDOSCOPY;  Service: Cardiopulmonary;  Laterality: Bilateral;       Home Medications    Prior to Admission medications   Medication Sig Start Date End Date Taking? Authorizing Provider  carvedilol  (COREG ) 12.5 MG tablet Take 1 tablet (12.5 mg total) by mouth 2 (two) times daily with a meal. 07/22/24  Yes [place], Harlene HERO, FNP  dapagliflozin  propanediol (FARXIGA ) 10 MG TABS tablet Take 1 tablet (10 mg total) by mouth daily.  07/22/24  Yes [place], Harlene HERO, FNP  ferrous sulfate  325 (65 FE) MG tablet Take 1 tablet (325 mg total) by mouth daily with breakfast. 05/07/24  Yes Elnora Ip, MD  hydrALAZINE  (APRESOLINE ) 25 MG tablet Take 1 tablet (25 mg total) by mouth every 8 (eight) hours. 08/29/24 09/28/24 Yes [place], Harlene HERO, FNP  isosorbide  mononitrate (IMDUR ) 30 MG 24 hr tablet Take 1/2 tablet (15 mg total) by mouth daily. 08/29/24 11/27/24 Yes [place], Harlene HERO, FNP  metolazone  (ZAROXOLYN ) 2.5 MG tablet Take 1 tablet (2.5 mg total) by mouth as directed only 1 time a week as  needed for weight gain of 3 lbs in 24 hours or 5 lbs in a week 08/29/24 11/27/24 Yes [place], Harlene HERO, FNP  potassium chloride  SA (KLOR-CON  M) 20 MEQ tablet Take 2 tablets (40 mEq total) by mouth daily. 08/15/24  Yes [place], Harlene HERO, FNP  predniSONE  (STERAPRED UNI-PAK 21 TAB) 10 MG (21) TBPK tablet Take 40 mg (4 tablets) days 1 and 2, take 30 mg (3 tablets) days 3 and 4, take 20 mg (2 tablets) days 5 and 6, take 10 mg (1 tablet) days 7 and 8, take 5 mg (0.5 tablet) days 9-10, then stop. 09/16/24  Yes Lonza Shimabukuro K, PA-C  spironolactone  (ALDACTONE ) 25 MG tablet Take 1 tablet (25 mg total) by mouth daily. 07/22/24  Yes [place], Harlene HERO, FNP  torsemide  (DEMADEX ) 20 MG tablet Take 1 tablet (20 mg total) by mouth daily. 09/05/24  Yes [place], Harlene HERO, FNP  valsartan  (DIOVAN ) 160 MG tablet Take 1 tablet (160 mg total) by mouth 2 (two) times daily. 07/22/24  Yes [place], Harlene HERO, FNP  gabapentin  (NEURONTIN ) 300 MG capsule Take 1 capsule (300 mg total) by mouth daily as needed (pain, neuropathy). 07/20/24 08/30/24  Emil Share, DO    Family History Family History  Problem Relation Age of Onset   Hypertension Mother    Diabetes Mother     Social History Social History   Tobacco Use   Smoking status: Every Day    Current packs/day: 0.00    Average packs/day: 0.5 packs/day for 20.0 years (10.0 ttl pk-yrs)    Types: Cigarettes    Start date: 11/23/1996    Last attempt to quit: 11/23/2016    Years since quitting: 7.8   Smokeless tobacco: Never  Vaping Use   Vaping status: Never Used  Substance Use Topics   Alcohol  use: Not Currently   Drug use: Yes    Types: Marijuana    Comment: everyday     Allergies   Zestril  [lisinopril ]   Review of Systems Review of Systems  Constitutional:  Positive for activity change. Negative for appetite change, fatigue and fever.  Respiratory:  Negative for shortness of breath.   Cardiovascular:  Negative for chest pain, palpitations and leg swelling.   Gastrointestinal:  Negative for diarrhea, nausea and vomiting.  Musculoskeletal:  Positive for arthralgias, gait problem and joint swelling. Negative for myalgias.  Skin:  Positive for color change. Negative for wound.  Neurological:  Negative for weakness and numbness.     Physical Exam Triage Vital Signs ED Triage Vitals  Encounter Vitals Group     BP 09/16/24 1208 (!) 158/99     Girls Systolic BP Percentile --      Girls Diastolic BP Percentile --      Boys Systolic BP Percentile --      Boys Diastolic BP Percentile --      Pulse Rate 09/16/24 1208  75     Resp 09/16/24 1208 18     Temp 09/16/24 1208 98 F (36.7 C)     Temp Source 09/16/24 1208 Oral     SpO2 09/16/24 1208 97 %     Weight 09/16/24 1207 (!) 302 lb (137 kg)     Height 09/16/24 1207 5' 11 (1.803 m)     Head Circumference --      Peak Flow --      Pain Score 09/16/24 1206 7     Pain Loc --      Pain Education --      Exclude from Growth Chart --    No data found.  Updated Vital Signs BP (!) 158/99 (BP Location: Left Arm)   Pulse 75   Temp 98 F (36.7 C) (Oral)   Resp 18   Ht 5' 11 (1.803 m)   Wt (!) 302 lb (137 kg)   SpO2 97%   BMI 42.12 kg/m   Visual Acuity Right Eye Distance:   Left Eye Distance:   Bilateral Distance:    Right Eye Near:   Left Eye Near:    Bilateral Near:     Physical Exam Vitals reviewed.  Constitutional:      General: He is awake.     Appearance: Normal appearance. He is well-developed. He is not ill-appearing.     Comments: Very pleasant male appears stated age in no acute distress sitting comfortably in exam room.  HENT:     Head: Normocephalic and atraumatic.  Cardiovascular:     Rate and Rhythm: Normal rate and regular rhythm.     Heart sounds: Normal heart sounds, S1 normal and S2 normal. No murmur heard.    Comments: Right foot capillary refill within 2 seconds right toes.  1+ pedal edema to ankle noted on the right.  Negative Homans' sign.  No palpable  cord.No swelling or erythema in the calf. Pulmonary:     Effort: Pulmonary effort is normal.     Breath sounds: Normal breath sounds. No stridor. No wheezing, rhonchi or rales.     Comments: Clear to auscultation bilaterally Musculoskeletal:     Right foot: Normal range of motion and normal capillary refill. Tenderness present. No deformity, prominent metatarsal heads or bony tenderness.     Comments: Right foot: Tenderness palpation with associated swelling and erythema over first MTP joint and along medial foot.  Foot is neurovascularly intact.  No deformity noted.  Feet:     Right foot:     Protective Sensation: 10 sites tested.  10 sites sensed.     Skin integrity: Erythema present. No ulcer, blister or skin breakdown.     Toenail Condition: Right toenails are long.  Neurological:     Mental Status: He is alert.  Psychiatric:        Behavior: Behavior is cooperative.      UC Treatments / Results  Labs (all labs ordered are listed, but only abnormal results are displayed) Labs Reviewed  CBC WITH DIFFERENTIAL/PLATELET  COMPREHENSIVE METABOLIC PANEL WITH GFR  URIC ACID    EKG   Radiology No results found.  Procedures Procedures (including critical care time)  Medications Ordered in UC Medications - No data to display  Initial Impression / Assessment and Plan / UC Course  I have reviewed the triage vital signs and the nursing notes.  Pertinent labs & imaging results that were available during my care of the patient were reviewed by me and considered  in my medical decision making (see chart for details).     Patient is well-appearing, afebrile, nontoxic, nontachycardic.  Low suspicion for cellulitis versus infection as patient reports that current symptoms are similar to previous episodes of gout and he denies any recent wound or injury or systemic symptoms.  Basic blood work was obtained including CBC, CMP, uric acid level.  If he has a significant leukocytosis or  abnormal kidney function that is increased from baseline he would need to be seen emergently.  Recommended prednisone  to help manage symptoms and we discussed that he is not to take NSAIDs with this medication due to risk of GI bleeding though he should not be taking these medications anyway due to history of CKD.  He can use Tylenol  as well as muscle relaxer and gabapentin  that previously was prescribed to help manage his symptoms.  We discussed that if his symptoms are not improving within a few days or if anything worsens and he develops fever, worsening swelling, increasing pain, chest pain, shortness of breath, nausea, vomiting he needs to be seen immediately.  Recommend close follow-up with his primary care.  Strict return precautions given.  Excuse note provided.  Final Clinical Impressions(s) / UC Diagnoses   Final diagnoses:  Acute gout due to renal impairment involving right foot  Right foot pain     Discharge Instructions      Take prednisone  as prescribed.  Do not take NSAIDs with this medication including aspirin , ibuprofen /Advil , naproxen /Aleve .  You can use Tylenol /acetaminophen  for additional pain relief.  I will contact you if any of your blood work is abnormal.  Please follow-up with your primary care as soon as possible; call to schedule an appointment.  If anything worsens you have increasing pain, redness, numbness or tingling in your foot, fever, nausea/vomiting you need to be seen immediately.     ED Prescriptions     Medication Sig Dispense Auth. Provider   predniSONE  (STERAPRED UNI-PAK 21 TAB) 10 MG (21) TBPK tablet Take 40 mg (4 tablets) days 1 and 2, take 30 mg (3 tablets) days 3 and 4, take 20 mg (2 tablets) days 5 and 6, take 10 mg (1 tablet) days 7 and 8, take 5 mg (0.5 tablet) days 9-10, then stop. 21 tablet Vanderbilt Ranieri K, PA-C      PDMP not reviewed this encounter.   Sherrell Rocky POUR, PA-C 09/16/24 1342

## 2024-09-16 NOTE — ED Triage Notes (Signed)
 Patient c/o right foot swelling x 3 days.  Patient believes it's his gout flaring up.  Patient has taken Tylenol  PM and Gabapentin .

## 2024-09-16 NOTE — Discharge Instructions (Signed)
 Take prednisone  as prescribed.  Do not take NSAIDs with this medication including aspirin , ibuprofen /Advil , naproxen /Aleve .  You can use Tylenol /acetaminophen  for additional pain relief.  I will contact you if any of your blood work is abnormal.  Please follow-up with your primary care as soon as possible; call to schedule an appointment.  If anything worsens you have increasing pain, redness, numbness or tingling in your foot, fever, nausea/vomiting you need to be seen immediately.

## 2024-09-26 ENCOUNTER — Other Ambulatory Visit (HOSPITAL_COMMUNITY): Payer: Self-pay

## 2024-09-26 ENCOUNTER — Ambulatory Visit: Payer: Self-pay

## 2024-09-26 VITALS — BP 154/99 | HR 79 | Temp 98.2°F | Resp 20 | Ht 71.0 in | Wt 316.0 lb

## 2024-09-26 DIAGNOSIS — I5020 Unspecified systolic (congestive) heart failure: Secondary | ICD-10-CM

## 2024-09-26 DIAGNOSIS — D508 Other iron deficiency anemias: Secondary | ICD-10-CM

## 2024-09-26 DIAGNOSIS — N1832 Chronic kidney disease, stage 3b: Secondary | ICD-10-CM

## 2024-09-26 DIAGNOSIS — I5021 Acute systolic (congestive) heart failure: Secondary | ICD-10-CM

## 2024-09-26 MED ORDER — SODIUM CHLORIDE 0.9 % IV SOLN
1000.0000 mg | Freq: Once | INTRAVENOUS | Status: AC
  Administered 2024-09-26: 1000 mg via INTRAVENOUS
  Filled 2024-09-26: qty 10

## 2024-09-26 MED ORDER — CLONIDINE HCL 0.1 MG PO TABS
0.1000 mg | ORAL_TABLET | Freq: Once | ORAL | Status: AC
  Administered 2024-09-26: 0.1 mg via ORAL
  Filled 2024-09-26: qty 1

## 2024-09-26 NOTE — Progress Notes (Signed)
 Diagnosis: Iron  Deficiency Anemia  Provider:  Mannam, Praveen Jeremy Hood  Procedure: IV Infusion  IV Type: Peripheral, IV Location: R Antecubital  Monoferric  (Ferric Derisomaltose ), Dose: 1000 mg  Infusion Start Time: 0953  Infusion Stop Time: 1026  Post Infusion IV Care: Observation period completed and PIV discontinued  Discharge: Condition: Good, Destination: Home . AVS Provided  Performed by:  Jeremy Miles, Jeremy Hood  Patient presented for monoferric  infusion appointment. Initial blood pressure 192/124. Secure message sent to medical director Jeremy Coder, Jeremy Hood and ordering provider Jeremy Gainer, Jeremy Hood at [phone]. After 15 minutes, BP checked again manually and was 181/105. Patient denied any symptoms including pain or headache. Stated that he had not taken his blood pressure medication this morning. Providers updated, and at 0903, 0.1 mg Clonidine  PO administered to patient per order from [facility] Jeremy Hood. After 30 minute observation, patient's blood pressure was 171/100. Ordering provider stated that it was ok to proceed with the infusion. Patient tolerated infusion well and waited 30 minute post-observation period. BP 154/99 at discharge.  Jeremy FORBES Sar, Jeremy Hood

## 2024-10-16 ENCOUNTER — Other Ambulatory Visit: Payer: Self-pay

## 2024-10-16 ENCOUNTER — Emergency Department (HOSPITAL_COMMUNITY)
Admission: EM | Admit: 2024-10-16 | Discharge: 2024-10-17 | Payer: Self-pay | Attending: Emergency Medicine | Admitting: Emergency Medicine

## 2024-10-16 DIAGNOSIS — M7989 Other specified soft tissue disorders: Secondary | ICD-10-CM | POA: Insufficient documentation

## 2024-10-16 DIAGNOSIS — Z5321 Procedure and treatment not carried out due to patient leaving prior to being seen by health care provider: Secondary | ICD-10-CM | POA: Insufficient documentation

## 2024-10-16 DIAGNOSIS — I509 Heart failure, unspecified: Secondary | ICD-10-CM | POA: Insufficient documentation

## 2024-10-16 NOTE — ED Triage Notes (Signed)
 PT arrives to triage via wheelchair with complaints of LEFT sided knee and foot swelling that began 2 days ago. Pt reports a hx of heart failure. Pt denies missing any doses of diuretics.

## 2024-10-16 NOTE — ED Notes (Signed)
Called for vitals no response

## 2024-10-17 NOTE — ED Notes (Signed)
Called for vitals no response

## 2024-10-25 ENCOUNTER — Other Ambulatory Visit: Payer: Self-pay

## 2024-10-25 ENCOUNTER — Encounter (HOSPITAL_COMMUNITY): Payer: Self-pay

## 2024-10-25 ENCOUNTER — Emergency Department (HOSPITAL_COMMUNITY): Payer: Self-pay

## 2024-10-25 ENCOUNTER — Emergency Department (HOSPITAL_COMMUNITY)
Admission: EM | Admit: 2024-10-25 | Discharge: 2024-10-25 | Discharge status: Against Medical Advice | Payer: Self-pay | Attending: Emergency Medicine | Admitting: Emergency Medicine

## 2024-10-25 DIAGNOSIS — R0602 Shortness of breath: Secondary | ICD-10-CM | POA: Insufficient documentation

## 2024-10-25 DIAGNOSIS — Z5321 Procedure and treatment not carried out due to patient leaving prior to being seen by health care provider: Secondary | ICD-10-CM | POA: Insufficient documentation

## 2024-10-25 DIAGNOSIS — M7989 Other specified soft tissue disorders: Secondary | ICD-10-CM

## 2024-10-25 DIAGNOSIS — R0789 Other chest pain: Secondary | ICD-10-CM | POA: Insufficient documentation

## 2024-10-25 DIAGNOSIS — I509 Heart failure, unspecified: Secondary | ICD-10-CM | POA: Insufficient documentation

## 2024-10-25 LAB — COMPREHENSIVE METABOLIC PANEL WITH GFR
ALT: 22 U/L (ref 0–44)
AST: 24 U/L (ref 15–41)
Albumin: 3.5 g/dL (ref 3.5–5.0)
Alkaline Phosphatase: 99 U/L (ref 38–126)
Anion gap: 12 (ref 5–15)
BUN: 20 mg/dL (ref 6–20)
CO2: 25 mmol/L (ref 22–32)
Calcium: 9.1 mg/dL (ref 8.9–10.3)
Chloride: 99 mmol/L (ref 98–111)
Creatinine, Ser: 1.3 mg/dL — ABNORMAL HIGH (ref 0.61–1.24)
GFR, Estimated: >60 mL/min
Glucose, Bld: 129 mg/dL — ABNORMAL HIGH (ref 70–99)
Potassium: 3.5 mmol/L (ref 3.5–5.1)
Sodium: 137 mmol/L (ref 135–145)
Total Bilirubin: 0.4 mg/dL (ref 0.0–1.2)
Total Protein: 7.5 g/dL (ref 6.5–8.1)

## 2024-10-25 LAB — CBC WITH DIFFERENTIAL/PLATELET
Abs Immature Granulocytes: 0.05 K/uL (ref 0.00–0.07)
Basophils Absolute: 0 K/uL (ref 0.0–0.1)
Basophils Relative: 0 %
Eosinophils Absolute: 0 K/uL (ref 0.0–0.5)
Eosinophils Relative: 0 %
HCT: 30.3 % — ABNORMAL LOW (ref 39.0–52.0)
Hemoglobin: 9.9 g/dL — ABNORMAL LOW (ref 13.0–17.0)
Immature Granulocytes: 1 %
Lymphocytes Relative: 16 %
Lymphs Abs: 1.7 K/uL (ref 0.7–4.0)
MCH: 29.2 pg (ref 26.0–34.0)
MCHC: 32.7 g/dL (ref 30.0–36.0)
MCV: 89.4 fL (ref 80.0–100.0)
Monocytes Absolute: 0.7 K/uL (ref 0.1–1.0)
Monocytes Relative: 6 %
Neutro Abs: 8 K/uL — ABNORMAL HIGH (ref 1.7–7.7)
Neutrophils Relative %: 77 %
Platelets: 451 K/uL — ABNORMAL HIGH (ref 150–400)
RBC: 3.39 MIL/uL — ABNORMAL LOW (ref 4.22–5.81)
RDW: 14.4 % (ref 11.5–15.5)
WBC: 10.5 K/uL (ref 4.0–10.5)
nRBC: 0 % (ref 0.0–0.2)

## 2024-10-25 LAB — TROPONIN T, HIGH SENSITIVITY
Troponin T High Sensitivity: 82 ng/L — ABNORMAL HIGH (ref 0–19)
Troponin T High Sensitivity: 89 ng/L — ABNORMAL HIGH (ref 0–19)

## 2024-10-25 LAB — PRO BRAIN NATRIURETIC PEPTIDE: Pro Brain Natriuretic Peptide: 2197 pg/mL — ABNORMAL HIGH

## 2024-10-25 NOTE — ED Triage Notes (Signed)
 Pt having left leg swelling, shortness of breath, chest pain, CHF exacerbation. Pt was taken off of Lasix  bc it was messing with his kidney function per pt. Pt does not wear oxygen at home.

## 2024-10-25 NOTE — ED Notes (Signed)
 Called PT three times for vitals check and no response .SABRASABRASABRA

## 2024-10-25 NOTE — Progress Notes (Signed)
 Right lower ext venous  has been completed. Refer to [facility] under chart review to view preliminary results.   10/25/2024  4:37 PM Malania Gawthrop, Ricka BIRCH

## 2024-10-25 NOTE — ED Provider Triage Note (Signed)
 Emergency Medicine Provider Triage Evaluation Note  Jeremy Hood , a 49 y.o. male  was evaluated in triage.  Pt complains of increasing shob and leg swelling worsened over the past 2-3 days. Hx of CHF, compliant with torsemide  and spironolactone . States symptoms worsened since off lasix  about 1 month ago. Endorses chest tightness and right leg pain. No blood thinners or history of dvt.   Review of Systems  Positive: See above Negative: Syncope, ha, dizziness  Physical Exam  BP (!) 154/86   Pulse 83   Temp 99.5 F (37.5 C)   Resp 19   SpO2 96%  Gen:   Awake, no distress   Resp:  Normal effort  MSK:   Moves extremities without difficulty  Other:  Lungs clear  Medical Decision Making  Medically screening exam initiated at 12:30 PM.  Appropriate orders placed.  [name] was informed that the remainder of the evaluation will be completed by another provider, this initial triage assessment does not replace that evaluation, and the importance of remaining in the ED until their evaluation is complete.     Neysa Thersia RAMAN, PA-C 10/25/24 1234

## 2024-11-04 ENCOUNTER — Other Ambulatory Visit: Payer: Self-pay

## 2024-11-04 ENCOUNTER — Other Ambulatory Visit (HOSPITAL_COMMUNITY): Payer: Self-pay

## 2024-11-04 ENCOUNTER — Other Ambulatory Visit (HOSPITAL_COMMUNITY): Payer: Self-pay | Admitting: Family Medicine

## 2024-11-04 MED ORDER — METOLAZONE 2.5 MG PO TABS
2.5000 mg | ORAL_TABLET | ORAL | 0 refills | Status: AC
  Filled 2024-11-04 – 2024-11-18 (×2): qty 5, 35d supply, fill #0

## 2024-11-04 MED ORDER — HYDRALAZINE HCL 25 MG PO TABS
25.0000 mg | ORAL_TABLET | Freq: Three times a day (TID) | ORAL | 1 refills | Status: AC
  Filled 2024-11-04: qty 90, 30d supply, fill #0

## 2024-11-08 ENCOUNTER — Other Ambulatory Visit (HOSPITAL_COMMUNITY): Payer: Self-pay

## 2024-11-10 ENCOUNTER — Other Ambulatory Visit (HOSPITAL_COMMUNITY): Payer: Self-pay

## 2024-11-12 ENCOUNTER — Other Ambulatory Visit (HOSPITAL_COMMUNITY): Payer: Self-pay

## 2024-11-16 ENCOUNTER — Other Ambulatory Visit: Payer: Self-pay

## 2024-11-18 ENCOUNTER — Other Ambulatory Visit: Payer: Self-pay
